# Patient Record
Sex: Female | Born: 1945
Health system: Southern US, Community
[De-identification: ages and names within clinical notes are randomized; demographics above are authoritative.]

## PROBLEM LIST (undated history)

## (undated) DIAGNOSIS — E669 Obesity, unspecified: Secondary | ICD-10-CM

## (undated) DIAGNOSIS — I10 Essential (primary) hypertension: Secondary | ICD-10-CM

## (undated) DIAGNOSIS — K635 Polyp of colon: Secondary | ICD-10-CM

## (undated) DIAGNOSIS — H269 Unspecified cataract: Secondary | ICD-10-CM

## (undated) DIAGNOSIS — L52 Erythema nodosum: Secondary | ICD-10-CM

## (undated) DIAGNOSIS — T7840XA Allergy, unspecified, initial encounter: Secondary | ICD-10-CM

## (undated) DIAGNOSIS — E785 Hyperlipidemia, unspecified: Secondary | ICD-10-CM

## (undated) DIAGNOSIS — E039 Hypothyroidism, unspecified: Secondary | ICD-10-CM

## (undated) DIAGNOSIS — F419 Anxiety disorder, unspecified: Secondary | ICD-10-CM

## (undated) DIAGNOSIS — K219 Gastro-esophageal reflux disease without esophagitis: Secondary | ICD-10-CM

## (undated) HISTORY — PX: COLONOSCOPY: SHX174

## (undated) HISTORY — PX: GUM SURGERY: SHX658

## (undated) HISTORY — DX: Allergy, unspecified, initial encounter: T78.40XA

## (undated) HISTORY — PX: ABDOMINAL HYSTERECTOMY: SHX81

## (undated) HISTORY — PX: HAMMER TOE SURGERY: SHX385

## (undated) HISTORY — PX: BUNIONECTOMY: SHX129

## (undated) HISTORY — DX: Erythema nodosum: L52

## (undated) HISTORY — DX: Essential (primary) hypertension: I10

## (undated) HISTORY — DX: Gastro-esophageal reflux disease without esophagitis: K21.9

## (undated) HISTORY — PX: ADENOIDECTOMY: SUR15

## (undated) HISTORY — DX: Polyp of colon: K63.5

## (undated) HISTORY — DX: Hypothyroidism, unspecified: E03.9

## (undated) HISTORY — DX: Hyperlipidemia, unspecified: E78.5

## (undated) HISTORY — PX: TONSILLECTOMY: SUR1361

## (undated) HISTORY — DX: Unspecified cataract: H26.9

## (undated) HISTORY — DX: Obesity, unspecified: E66.9

## (undated) HISTORY — PX: OOPHORECTOMY: SHX86

## (undated) HISTORY — DX: Anxiety disorder, unspecified: F41.9

---

## 1990-09-13 HISTORY — PX: LAPAROSCOPIC ASSISTED VAGINAL HYSTERECTOMY: SHX5398

## 1998-07-18 ENCOUNTER — Other Ambulatory Visit: Admission: RE | Admit: 1998-07-18 | Discharge: 1998-07-18 | Payer: Self-pay | Admitting: Obstetrics and Gynecology

## 1999-08-11 ENCOUNTER — Other Ambulatory Visit: Admission: RE | Admit: 1999-08-11 | Discharge: 1999-08-11 | Payer: Self-pay | Admitting: Obstetrics and Gynecology

## 2000-04-27 ENCOUNTER — Other Ambulatory Visit: Admission: RE | Admit: 2000-04-27 | Discharge: 2000-04-27 | Payer: Self-pay | Admitting: Plastic Surgery

## 2000-08-11 ENCOUNTER — Other Ambulatory Visit: Admission: RE | Admit: 2000-08-11 | Discharge: 2000-08-11 | Payer: Self-pay | Admitting: Obstetrics and Gynecology

## 2000-09-13 HISTORY — PX: BREAST REDUCTION SURGERY: SHX8

## 2001-03-13 ENCOUNTER — Encounter: Payer: Self-pay | Admitting: Internal Medicine

## 2001-03-13 LAB — CONVERTED CEMR LAB

## 2003-07-03 ENCOUNTER — Other Ambulatory Visit: Admission: RE | Admit: 2003-07-03 | Discharge: 2003-07-03 | Payer: Self-pay | Admitting: Obstetrics and Gynecology

## 2003-07-03 LAB — HM PAP SMEAR: HM Pap smear: NEGATIVE

## 2003-08-21 LAB — HM COLONOSCOPY

## 2004-01-23 ENCOUNTER — Encounter: Admission: RE | Admit: 2004-01-23 | Discharge: 2004-01-23 | Payer: Self-pay | Admitting: Internal Medicine

## 2004-01-23 IMAGING — US US EXTREM LOW VENOUS*L*
1 series · 14 of 24 positions shown · non-contrast
Comparison: none

CLINICAL DATA: Left foot swelling for several weeks.  Left knee pain.

[Series 1: unknown · 14 of 40 slices shown]
[im 1/40]
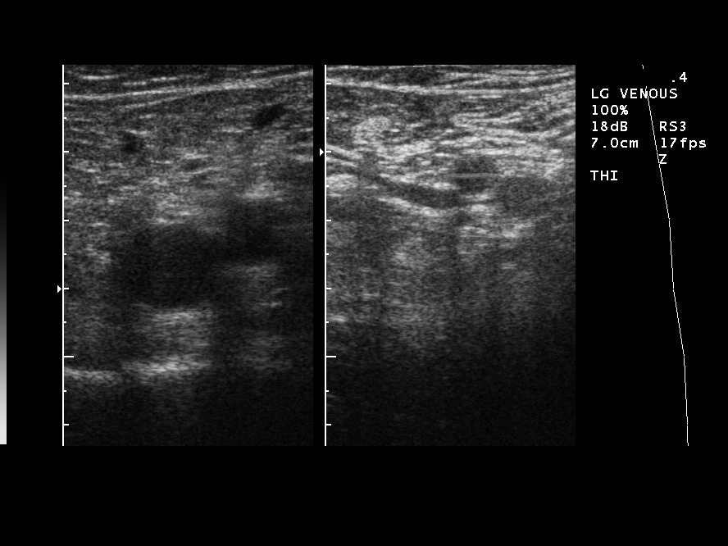
[im 4/40]
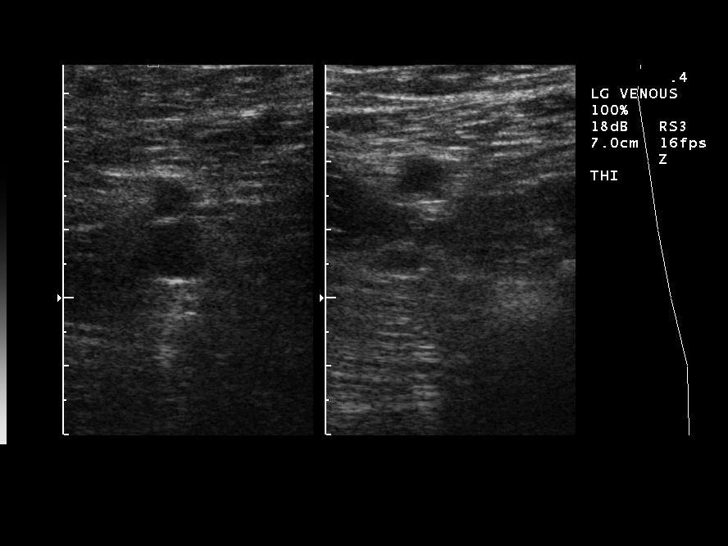
[im 7/40]
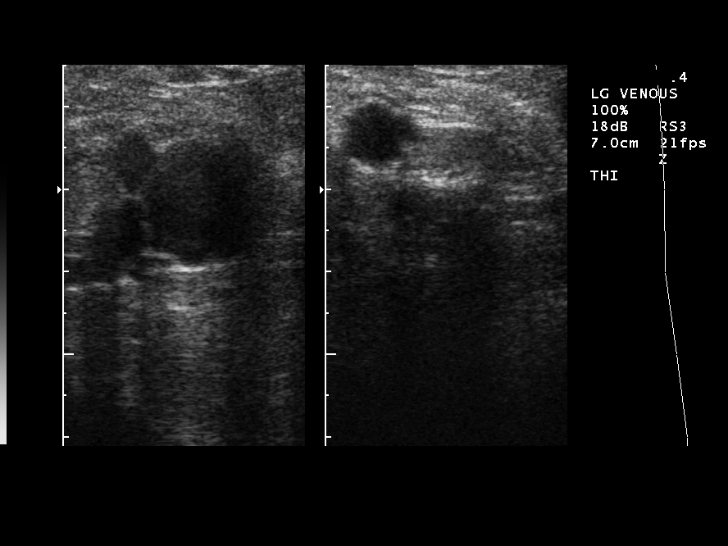
[im 11/40]
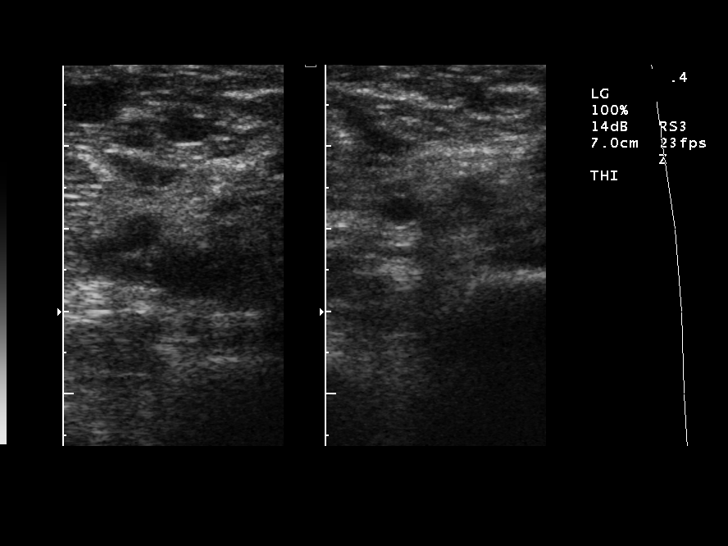
[im 12/40]
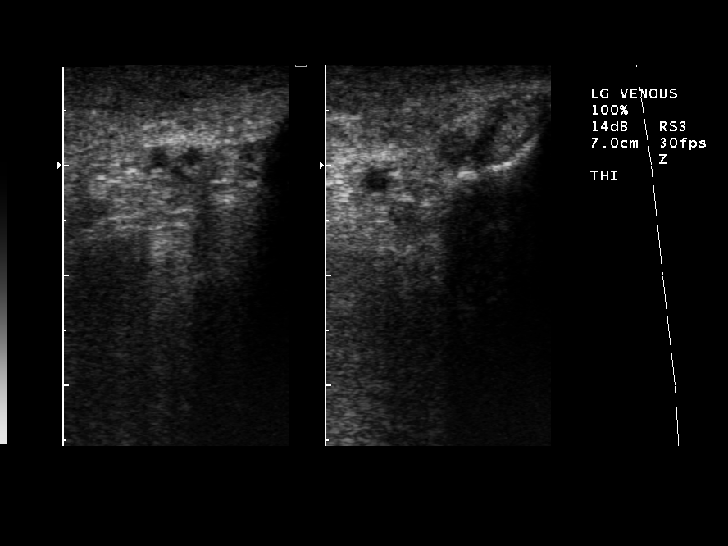
[im 16/40]
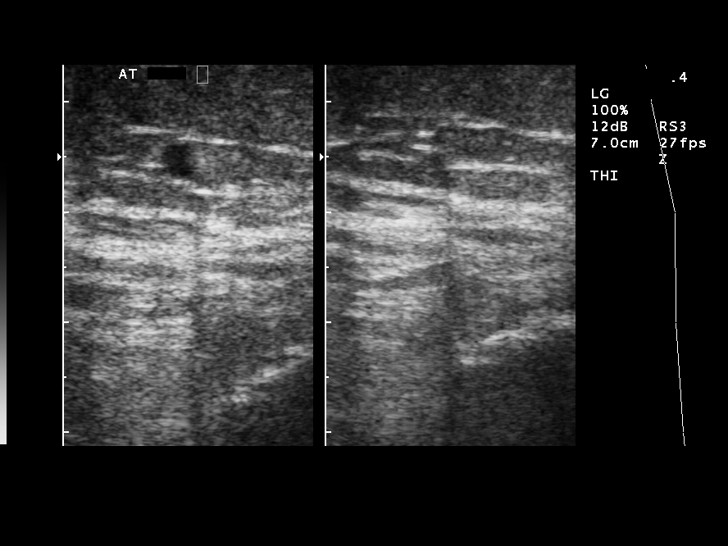
[im 19/40]
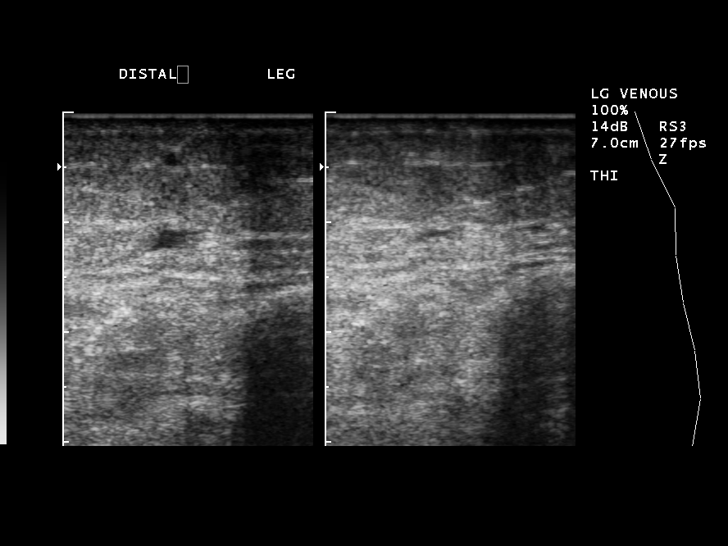
[im 21/40]
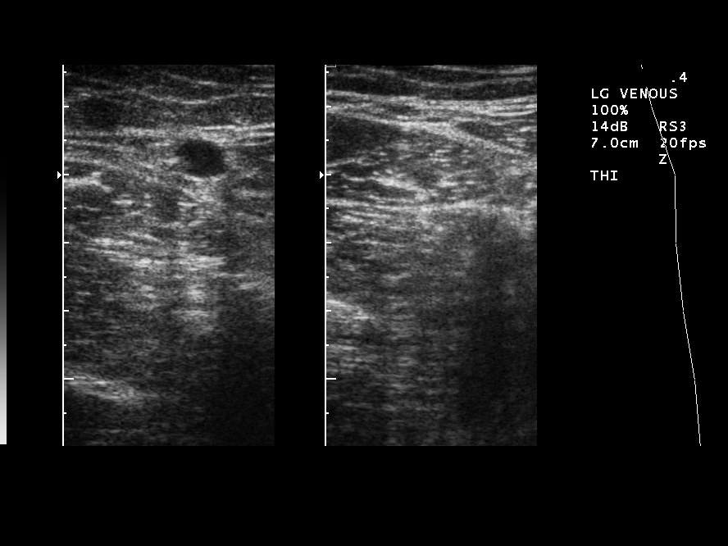
[im 24/40]
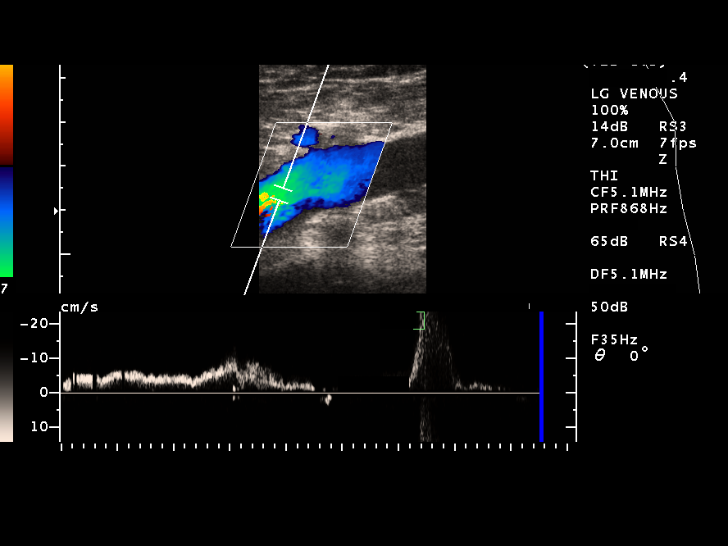
[im 28/40]
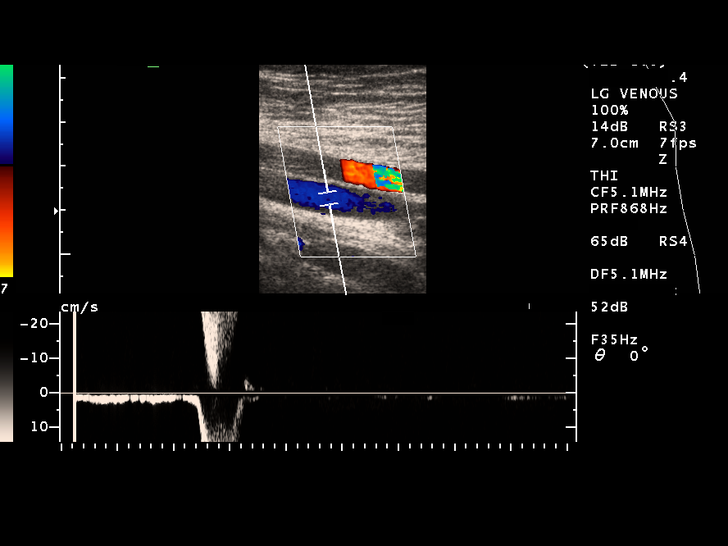
[im 31/40]
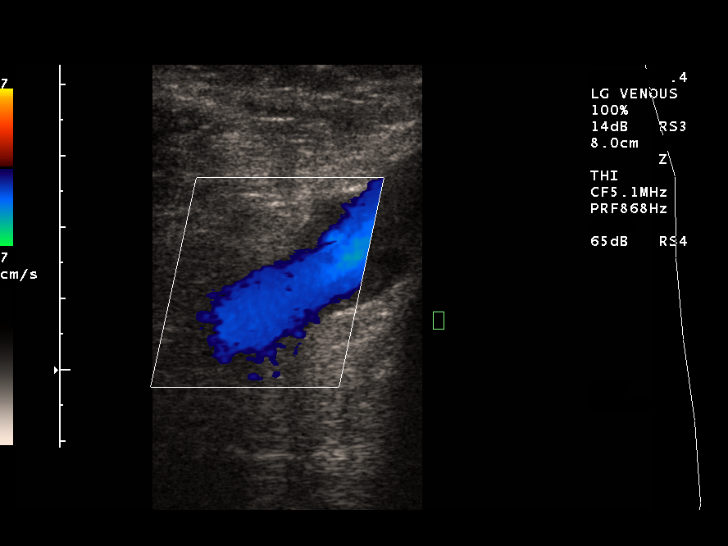
[im 33/40]
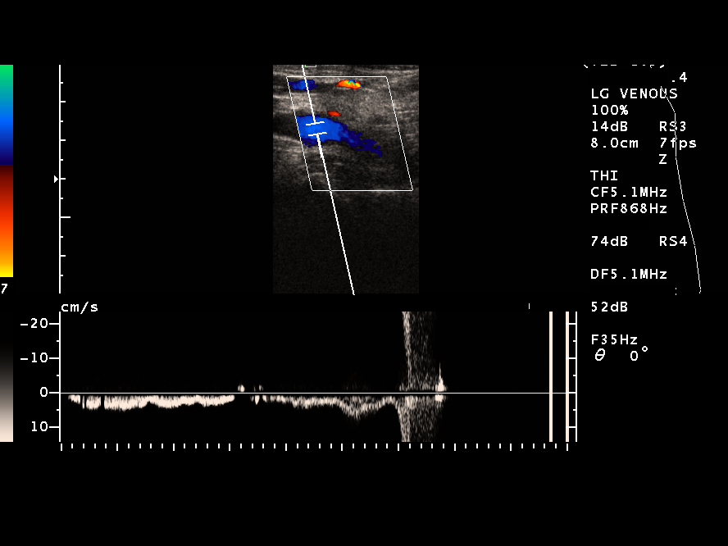
[im 36/40]
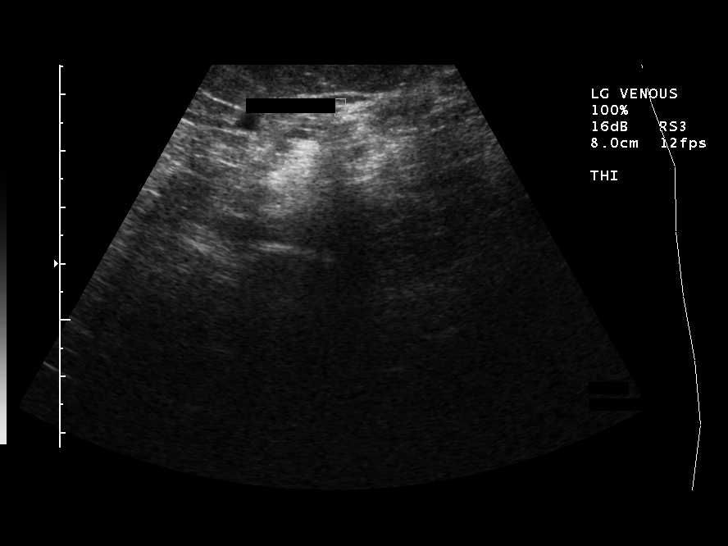
[im 40/40]
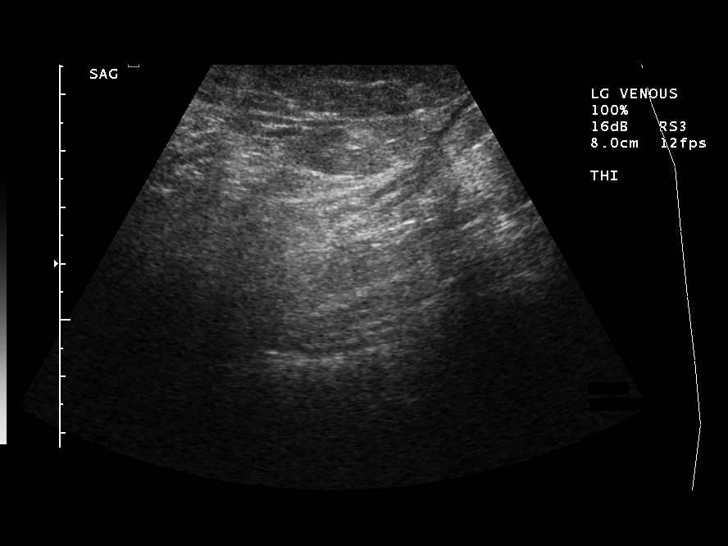

[14 of 24 positions shown; findings below may reference images not displayed]

ULTRASOUND VENOUS IMAGING UNILATERAL LEFT 
 Gray scale and Doppler ultrasound evaluation of the deep venous system was performed from the level of the common femoral vein to the popliteal vein. Complete venous compressibility is demonstrated. Duplex wave forms show normal directional venous flow, with respiratory phasicity and normal response to augmentation.  No sonographic popliteal cyst is seen. 

 IMPRESSION
 No evidence of deep venous thrombosis.

## 2004-07-22 ENCOUNTER — Ambulatory Visit: Payer: Self-pay | Admitting: Internal Medicine

## 2004-07-29 ENCOUNTER — Ambulatory Visit: Payer: Self-pay | Admitting: Internal Medicine

## 2004-09-23 ENCOUNTER — Ambulatory Visit: Payer: Self-pay | Admitting: Internal Medicine

## 2005-07-22 ENCOUNTER — Ambulatory Visit: Payer: Self-pay | Admitting: Internal Medicine

## 2005-08-02 ENCOUNTER — Ambulatory Visit: Payer: Self-pay | Admitting: Internal Medicine

## 2005-09-15 ENCOUNTER — Ambulatory Visit: Payer: Self-pay | Admitting: Internal Medicine

## 2005-09-23 ENCOUNTER — Ambulatory Visit: Payer: Self-pay | Admitting: Internal Medicine

## 2006-05-27 ENCOUNTER — Ambulatory Visit: Payer: Self-pay | Admitting: Internal Medicine

## 2006-06-01 ENCOUNTER — Ambulatory Visit: Payer: Self-pay

## 2006-07-28 ENCOUNTER — Ambulatory Visit: Payer: Self-pay | Admitting: Internal Medicine

## 2006-07-28 LAB — CONVERTED CEMR LAB
ALT: 23 units/L (ref 0–40)
AST: 17 units/L (ref 0–37)
Albumin: 3.5 g/dL (ref 3.5–5.2)
Alkaline Phosphatase: 94 units/L (ref 39–117)
BUN: 19 mg/dL (ref 6–23)
Basophils Absolute: 0 10*3/uL (ref 0.0–0.1)
Basophils Relative: 0.5 % (ref 0.0–1.0)
Bilirubin Urine: NEGATIVE
CO2: 29 meq/L (ref 19–32)
Calcium: 9 mg/dL (ref 8.4–10.5)
Chloride: 108 meq/L (ref 96–112)
Chol/HDL Ratio, serum: 3.5
Cholesterol: 163 mg/dL (ref 0–200)
Creatinine, Ser: 0.9 mg/dL (ref 0.4–1.2)
Eosinophil percent: 2.2 % (ref 0.0–5.0)
Free T4: 0.8 ng/dL — ABNORMAL LOW (ref 0.9–1.8)
GFR calc non Af Amer: 68 mL/min
Glomerular Filtration Rate, Af Am: 83 mL/min/{1.73_m2}
Glucose, Bld: 113 mg/dL — ABNORMAL HIGH (ref 70–99)
HCT: 39.6 % (ref 36.0–46.0)
HDL: 46.8 mg/dL (ref >39.0)
Hemoglobin, Urine: NEGATIVE
Hemoglobin: 13 g/dL (ref 12.0–15.0)
Ketones, ur: NEGATIVE mg/dL
LDL Cholesterol: 99 mg/dL (ref 0–99)
Leukocytes, UA: NEGATIVE
Lymphocytes Relative: 34.6 % (ref 12.0–46.0)
MCHC: 32.9 g/dL (ref 30.0–36.0)
MCV: 83.9 fL (ref 78.0–100.0)
Monocytes Absolute: 0.5 10*3/uL (ref 0.2–0.7)
Monocytes Relative: 7.1 % (ref 3.0–11.0)
Neutro Abs: 3.8 10*3/uL (ref 1.4–7.7)
Neutrophils Relative %: 55.6 % (ref 43.0–77.0)
Nitrite: NEGATIVE
Platelets: 171 10*3/uL (ref 150–400)
Potassium: 4.4 meq/L (ref 3.5–5.1)
RBC: 4.72 M/uL (ref 3.87–5.11)
RDW: 12.6 % (ref 11.5–14.6)
Sodium: 143 meq/L (ref 135–145)
Specific Gravity, Urine: >=1.03 (ref 1.000–1.03)
TSH: 6.03 microintl units/mL — ABNORMAL HIGH (ref 0.35–5.50)
Total Bilirubin: 0.5 mg/dL (ref 0.3–1.2)
Total Protein, Urine: NEGATIVE mg/dL
Total Protein: 6.6 g/dL (ref 6.0–8.3)
Triglyceride fasting, serum: 85 mg/dL (ref 0–149)
Urine Glucose: NEGATIVE mg/dL
Urobilinogen, UA: 0.2 (ref 0.0–1.0)
VLDL: 17 mg/dL (ref 0–40)
WBC: 6.7 10*3/uL (ref 4.5–10.5)
pH: 5.5 (ref 5.0–8.0)

## 2006-08-03 ENCOUNTER — Ambulatory Visit: Payer: Self-pay | Admitting: Internal Medicine

## 2006-09-07 ENCOUNTER — Ambulatory Visit: Payer: Self-pay | Admitting: Internal Medicine

## 2006-09-07 LAB — CONVERTED CEMR LAB
Free T4: 0.9 ng/dL (ref 0.6–1.6)
TSH: 4.97 microintl units/mL (ref 0.35–5.50)

## 2006-10-06 ENCOUNTER — Ambulatory Visit: Payer: Self-pay | Admitting: Internal Medicine

## 2006-12-07 ENCOUNTER — Ambulatory Visit: Payer: Self-pay | Admitting: Internal Medicine

## 2006-12-15 ENCOUNTER — Ambulatory Visit: Payer: Self-pay | Admitting: Internal Medicine

## 2006-12-15 LAB — CONVERTED CEMR LAB: TSH: 6.93 microintl units/mL — ABNORMAL HIGH (ref 0.35–5.50)

## 2007-01-19 ENCOUNTER — Ambulatory Visit: Payer: Self-pay | Admitting: Internal Medicine

## 2007-01-19 LAB — CONVERTED CEMR LAB: TSH: 2.24 microintl units/mL (ref 0.35–5.50)

## 2007-04-28 ENCOUNTER — Encounter: Payer: Self-pay | Admitting: Internal Medicine

## 2007-04-28 DIAGNOSIS — L52 Erythema nodosum: Secondary | ICD-10-CM

## 2007-05-07 DIAGNOSIS — E669 Obesity, unspecified: Secondary | ICD-10-CM

## 2007-05-07 DIAGNOSIS — E039 Hypothyroidism, unspecified: Secondary | ICD-10-CM

## 2007-05-07 DIAGNOSIS — I10 Essential (primary) hypertension: Secondary | ICD-10-CM

## 2007-05-07 DIAGNOSIS — E785 Hyperlipidemia, unspecified: Secondary | ICD-10-CM

## 2007-05-07 DIAGNOSIS — F418 Other specified anxiety disorders: Secondary | ICD-10-CM

## 2007-07-31 ENCOUNTER — Ambulatory Visit: Payer: Self-pay | Admitting: Internal Medicine

## 2007-07-31 LAB — CONVERTED CEMR LAB
Albumin: 4.2 g/dL (ref 3.5–5.2)
Basophils Absolute: 0 10*3/uL (ref 0.0–0.1)
CO2: 29 meq/L (ref 19–32)
Cholesterol: 188 mg/dL (ref 0–200)
Creatinine, Ser: 0.9 mg/dL (ref 0.4–1.2)
HCT: 44.9 % (ref 36.0–46.0)
HDL: 45.9 mg/dL (ref >39.0)
Hemoglobin, Urine: NEGATIVE
Hemoglobin: 15.3 g/dL — ABNORMAL HIGH (ref 12.0–15.0)
Leukocytes, UA: NEGATIVE
MCHC: 34 g/dL (ref 30.0–36.0)
MCV: 84.2 fL (ref 78.0–100.0)
Monocytes Absolute: 0.5 10*3/uL (ref 0.2–0.7)
Neutrophils Relative %: 63.2 % (ref 43.0–77.0)
Potassium: 3.7 meq/L (ref 3.5–5.1)
RDW: 12.2 % (ref 11.5–14.6)
Sodium: 137 meq/L (ref 135–145)
TSH: 9.76 microintl units/mL — ABNORMAL HIGH (ref 0.35–5.50)
Total Bilirubin: 1.1 mg/dL (ref 0.3–1.2)
Total Protein: 7.9 g/dL (ref 6.0–8.3)
Urobilinogen, UA: 0.2 (ref 0.0–1.0)
pH: 5.5 (ref 5.0–8.0)

## 2007-08-06 DIAGNOSIS — Z860100 Personal history of colon polyps, unspecified: Secondary | ICD-10-CM | POA: Insufficient documentation

## 2007-08-06 DIAGNOSIS — K219 Gastro-esophageal reflux disease without esophagitis: Secondary | ICD-10-CM | POA: Insufficient documentation

## 2007-08-07 ENCOUNTER — Ambulatory Visit: Payer: Self-pay | Admitting: Internal Medicine

## 2007-09-11 ENCOUNTER — Ambulatory Visit: Payer: Self-pay | Admitting: Internal Medicine

## 2007-09-11 DIAGNOSIS — E038 Other specified hypothyroidism: Secondary | ICD-10-CM

## 2007-10-20 ENCOUNTER — Telehealth (INDEPENDENT_AMBULATORY_CARE_PROVIDER_SITE_OTHER): Payer: Self-pay | Admitting: *Deleted

## 2007-11-27 ENCOUNTER — Telehealth: Payer: Self-pay | Admitting: Internal Medicine

## 2007-11-30 ENCOUNTER — Ambulatory Visit: Payer: Self-pay | Admitting: Internal Medicine

## 2007-11-30 LAB — CONVERTED CEMR LAB
Albumin: 3.7 g/dL (ref 3.5–5.2)
Alkaline Phosphatase: 94 units/L (ref 39–117)
LDL Cholesterol: 87 mg/dL (ref 0–99)
Total CHOL/HDL Ratio: 3.9

## 2008-01-01 ENCOUNTER — Ambulatory Visit: Payer: Self-pay | Admitting: Internal Medicine

## 2008-01-31 ENCOUNTER — Ambulatory Visit: Payer: Self-pay | Admitting: Internal Medicine

## 2008-07-23 LAB — HM MAMMOGRAPHY: HM Mammogram: NORMAL

## 2008-08-02 ENCOUNTER — Ambulatory Visit: Payer: Self-pay | Admitting: Internal Medicine

## 2008-08-02 LAB — CONVERTED CEMR LAB
Albumin: 3.6 g/dL (ref 3.5–5.2)
BUN: 19 mg/dL (ref 6–23)
Basophils Absolute: 0 10*3/uL (ref 0.0–0.1)
Basophils Relative: 0.5 % (ref 0.0–3.0)
Calcium: 9.3 mg/dL (ref 8.4–10.5)
Chloride: 105 meq/L (ref 96–112)
Cholesterol: 153 mg/dL (ref 0–200)
Creatinine, Ser: 0.8 mg/dL (ref 0.4–1.2)
Eosinophils Absolute: 0.2 10*3/uL (ref 0.0–0.7)
GFR calc Af Amer: 94 mL/min
GFR calc non Af Amer: 78 mL/min
HCT: 41.5 % (ref 36.0–46.0)
Hemoglobin, Urine: NEGATIVE
Ketones, ur: NEGATIVE mg/dL
LDL Cholesterol: 90 mg/dL (ref 0–99)
MCHC: 33.9 g/dL (ref 30.0–36.0)
MCV: 82.6 fL (ref 78.0–100.0)
Monocytes Absolute: 0.6 10*3/uL (ref 0.1–1.0)
Neutro Abs: 4.8 10*3/uL (ref 1.4–7.7)
Neutrophils Relative %: 56.8 % (ref 43.0–77.0)
RBC: 5.03 M/uL (ref 3.87–5.11)
Specific Gravity, Urine: 1.025 (ref 1.000–1.03)
TSH: 0.45 microintl units/mL (ref 0.35–5.50)
Total Bilirubin: 0.8 mg/dL (ref 0.3–1.2)
Total Protein, Urine: NEGATIVE mg/dL
Triglycerides: 109 mg/dL (ref 0–149)
Urine Glucose: NEGATIVE mg/dL
Urobilinogen, UA: 0.2 (ref 0.0–1.0)
VLDL: 22 mg/dL (ref 0–40)

## 2008-08-07 ENCOUNTER — Ambulatory Visit: Payer: Self-pay | Admitting: Internal Medicine

## 2008-08-07 DIAGNOSIS — R9431 Abnormal electrocardiogram [ECG] [EKG]: Secondary | ICD-10-CM | POA: Insufficient documentation

## 2008-08-22 ENCOUNTER — Encounter: Payer: Self-pay | Admitting: Internal Medicine

## 2008-08-22 ENCOUNTER — Ambulatory Visit: Payer: Self-pay

## 2008-10-29 ENCOUNTER — Telehealth (INDEPENDENT_AMBULATORY_CARE_PROVIDER_SITE_OTHER): Payer: Self-pay | Admitting: *Deleted

## 2008-10-30 ENCOUNTER — Ambulatory Visit: Payer: Self-pay | Admitting: Internal Medicine

## 2008-10-30 LAB — CONVERTED CEMR LAB: TSH: 1.16 microintl units/mL (ref 0.35–5.50)

## 2008-12-16 ENCOUNTER — Telehealth (INDEPENDENT_AMBULATORY_CARE_PROVIDER_SITE_OTHER): Payer: Self-pay | Admitting: *Deleted

## 2009-02-26 ENCOUNTER — Ambulatory Visit: Payer: Self-pay | Admitting: Family Medicine

## 2009-03-03 ENCOUNTER — Ambulatory Visit: Payer: Self-pay | Admitting: Family Medicine

## 2009-03-18 ENCOUNTER — Ambulatory Visit: Payer: Self-pay | Admitting: Internal Medicine

## 2009-04-16 ENCOUNTER — Ambulatory Visit: Payer: Self-pay | Admitting: Internal Medicine

## 2009-04-18 LAB — CONVERTED CEMR LAB: TSH: 0.27 microintl units/mL — ABNORMAL LOW (ref 0.35–5.50)

## 2009-08-04 ENCOUNTER — Encounter: Payer: Self-pay | Admitting: Internal Medicine

## 2009-09-25 ENCOUNTER — Ambulatory Visit: Payer: Self-pay | Admitting: Internal Medicine

## 2009-09-25 ENCOUNTER — Telehealth: Payer: Self-pay | Admitting: Internal Medicine

## 2009-09-25 LAB — CONVERTED CEMR LAB
AST: 19 units/L (ref 0–37)
Albumin: 3.9 g/dL (ref 3.5–5.2)
Alkaline Phosphatase: 95 units/L (ref 39–117)
Basophils Absolute: 0 10*3/uL (ref 0.0–0.1)
Basophils Relative: 0.1 % (ref 0.0–3.0)
Bilirubin Urine: NEGATIVE
Bilirubin, Direct: 0 mg/dL (ref 0.0–0.3)
Calcium: 9.1 mg/dL (ref 8.4–10.5)
Creatinine, Ser: 0.8 mg/dL (ref 0.4–1.2)
Eosinophils Absolute: 0.2 10*3/uL (ref 0.0–0.7)
GFR calc non Af Amer: 77.21 mL/min (ref >60)
HDL: 41.4 mg/dL (ref >39.00)
Hemoglobin: 14 g/dL (ref 12.0–15.0)
Ketones, ur: NEGATIVE mg/dL
LDL Cholesterol: 82 mg/dL (ref 0–99)
Leukocytes, UA: NEGATIVE
Lymphocytes Relative: 35.7 % (ref 12.0–46.0)
MCHC: 32.7 g/dL (ref 30.0–36.0)
Monocytes Relative: 7.3 % (ref 3.0–12.0)
Neutro Abs: 3.9 10*3/uL (ref 1.4–7.7)
Neutrophils Relative %: 54.5 % (ref 43.0–77.0)
Nitrite: NEGATIVE
RBC: 4.95 M/uL (ref 3.87–5.11)
Sodium: 135 meq/L (ref 135–145)
Total CHOL/HDL Ratio: 4
Total Protein: 7 g/dL (ref 6.0–8.3)
Triglycerides: 106 mg/dL (ref 0.0–149.0)
Urobilinogen, UA: 0.2 (ref 0.0–1.0)

## 2009-09-26 ENCOUNTER — Encounter: Payer: Self-pay | Admitting: Internal Medicine

## 2009-09-29 ENCOUNTER — Telehealth: Payer: Self-pay | Admitting: Internal Medicine

## 2009-09-29 ENCOUNTER — Ambulatory Visit: Payer: Self-pay | Admitting: Internal Medicine

## 2009-09-29 DIAGNOSIS — N959 Unspecified menopausal and perimenopausal disorder: Secondary | ICD-10-CM | POA: Insufficient documentation

## 2009-10-20 ENCOUNTER — Ambulatory Visit: Payer: Self-pay | Admitting: Internal Medicine

## 2009-10-20 ENCOUNTER — Encounter: Payer: Self-pay | Admitting: Internal Medicine

## 2010-08-24 ENCOUNTER — Encounter: Payer: Self-pay | Admitting: Gastroenterology

## 2010-10-11 LAB — CONVERTED CEMR LAB
ALT: 34 units/L (ref 0–35)
AST: 24 units/L (ref 0–37)
Albumin: 3.4 g/dL — ABNORMAL LOW (ref 3.5–5.2)
Alkaline Phosphatase: 92 units/L (ref 39–117)
Pap Smear: NORMAL
Pap Smear: NORMAL
Total Bilirubin: 0.6 mg/dL (ref 0.3–1.2)

## 2010-10-13 NOTE — Progress Notes (Signed)
Summary: Bone density scan  Phone Note Call from Patient   Caller: Patient [phone] Summary of Call: pt called requesting to know if she could be scheduled to have bone density scan. last scan 2008 Initial call taken by: Margaret Pyle, CMA,  September 29, 2009 3:35 PM  Follow-up for Phone Call        yes - I will do  done hardcopy to LIM side B - dahlia  Follow-up by: Corwin Levins MD,  September 29, 2009 5:32 PM  Additional Follow-up for Phone Call Additional follow up Details #1::        paperwork forwarded to [facility] to schedule Additional Follow-up by: Margaret Pyle, CMA,  September 30, 2009 9:46 AM  New Problems: MENOPAUSAL DISORDER (ICD-627.9)   Additional Follow-up for Phone Call Additional follow up Details #2::    PT IS SCHEDULED FOR FEB 7 AT 9:00.   Follow-up by: Hilarie Fredrickson,  October 01, 2009 11:42 AM  New Problems: MENOPAUSAL DISORDER (ICD-627.9)

## 2010-10-13 NOTE — Progress Notes (Signed)
----   Converted from flag ---- ---- 09/25/2009 2:04 PM, Corwin Levins MD wrote: ok for verbal  ---- 09/25/2009 8:05 AM, [name] wrote: the lab called this morning. Mary Ward is in the Lab with a Vitamin D lab order from her OBGYN. They need you to approve it since she is having it done here. Let me know as they only need a verbal. ------------------------------  Phone Note Other Incoming    Follow-up for Phone Call        called lab to give Dr. Jonny Ruiz verbal ok, lab stated they needed a faxed addon sheet, with Vit D 858.10 Follow-up by: Scharlene Gloss,  September 25, 2009 4:44 PM

## 2010-10-13 NOTE — Assessment & Plan Note (Signed)
Summary: PER PT Mary Ward PHYSICAL  STC   Vital Signs:  Patient profile:   65 year old female Height:      65.5 inches Weight:      194 pounds BMI:     31.91 O2 Sat:      98 % on Room air Temp:     97.3 degrees F oral Pulse rate:   64 / minute BP sitting:   120 / 82  (left arm) Cuff size:   regular  Vitals Entered ByZella Ball Ward (September 29, 2009 8:37 AM)  O2 Flow:  Room air   CC: adult physical/RE   CC:  adult physical/RE.  History of Present Illness: lost almost 25 lbs sicne june 2010 with more excercise and better diet; Pt denies CP, sob, doe, wheezing, orthopnea, pnd, worsening LE edema, palps, dizziness or syncope  Pt denies new neuro symptoms such as headache, facial or extremity weakness     Problems Prior to Update: 1)  Menopausal Disorder  (ICD-627.9) 2)  Electrocardiogram, Abnormal  (ICD-794.31) 3)  Preventive Health Care  (ICD-V70.0) 4)  Need Proph Vaccination&inoculat Oth Viral Dz  (ICD-V04.89) 5)  Other Specified Acquired Hypothyroidism  (ICD-244.8) 6)  Preventive Health Care  (ICD-V70.0) 7)  Family History Diabetes 1st Degree Relative  (ICD-V18.0) 8)  Colonic Polyps, Hx of  (ICD-V12.72) 9)  Family History of Cad Female 1st Degree Relative <60  (ICD-V16.49) 10)  Gerd  (ICD-530.81) 11)  Routine General Medical Exam@health  Care Facl  (ICD-V70.0) 12)  Hypothyroidism  (ICD-244.9) 13)  Hypertension  (ICD-401.9) 14)  Anxiety  (ICD-300.00) 15)  Obesity  (ICD-278.00) 16)  Hyperlipidemia  (ICD-272.4) 17)  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 1 By Mouth Once Daily 2)  Lipitor 20 Mg  Tabs (Atorvastatin Calcium) .Marland Kitchen.. 1 By Mouth Once Daily 3)  Micardis Hct 80-25 Mg Tabs (Telmisartan-Hctz) .... Take 1/2 By Mouth Qd 4)  Ecotrin Low Strength 81 Mg  Tbec (Aspirin) .Marland Kitchen.. 1 By Mouth Qd 5)  Vitamin D [address] Unit  Caps (Ergocalciferol) .... Take 1 Tablet By Mouth Once Every Two  Weeks  Current Medications (verified): 1)  Levothyroxine Sodium 137 Mcg Tabs (Levothyroxine Sodium) .Marland Kitchen.. 1 By Mouth Once Daily 2)  Lipitor 20 Mg  Tabs (Atorvastatin Calcium) .Marland Kitchen.. 1 By Mouth Once Daily 3)  Micardis Hct 40-12.5 Mg Tabs (Telmisartan-Hctz) .Marland Kitchen.. 1 By Mouth Once Daily 4)  Ecotrin Low Strength 81 Mg  Tbec (Aspirin) .Marland Kitchen.. 1 By Mouth Qd 5)  Vitamin D 60454 Unit  Caps (Ergocalciferol) .... Take 1 Tablet By Mouth Once Every Two Weeks  Allergies (verified): 1)  ! Prednisone 2)  ! Tetracycline 3)  ! Claritin 4)  ! Indocin 5)  ! * Tetanus  Past History:  Past Medical History: Last updated: 08/07/2007 Hyperlipidemia Obesity Anxiety Hypertension Hypothyroidism GERD erythema nodosum hx Colonic polyps, hx of - hyperplastic  Past Surgical History: Last updated: 08/07/2007 Hysterectomy Oophorectomy Tonsillectomy Breast reduction surgury 2002  s/p right 2nd toe hammer toe surgury  Family History: Last updated: 08/07/2008 Family History High cholesterol Family History of CAD Female 1st degree relative  - sister Family History Lung cancer - father Family History of Stroke F 1st degree relative  - mother - also elev chol Family History Diabetes 1st degree relative - sister sister died with MI at 33 yo after multiple silent MI  Social History: Last updated: 09/29/2009 Never Smoked Alcohol use-yes - rare Married no  children work - Mary Ward - Mary Ward  Risk Factors: Smoking Status: never (08/06/2007)  Social History: Reviewed history from 01/31/2008 and no changes required. Never Smoked Alcohol use-yes - rare Married no children work - Mary Ward - Mary Ward  Review of Systems  The patient denies anorexia, fever, weight loss, weight gain, vision loss, decreased hearing, hoarseness, chest pain, syncope, dyspnea on exertion, peripheral edema, prolonged cough, headaches, hemoptysis, abdominal pain, melena, hematochezia, severe indigestion/heartburn, hematuria,  incontinence, muscle weakness, suspicious skin lesions, transient blindness, difficulty walking, depression, unusual weight change, abnormal bleeding, enlarged lymph nodes, and angioedema.         all otherwise negative per pt -  Physical Exam  General:  alert and overweight-appearing.   Head:  normocephalic and atraumatic.   Eyes:  vision grossly intact, pupils equal, and pupils round.   Ears:  R ear normal and L ear normal.   Nose:  no external deformity and no nasal discharge.   Mouth:  no gingival abnormalities and pharynx pink and moist.   Neck:  supple and no masses.   Lungs:  normal respiratory effort and normal breath sounds.   Heart:  normal rate and regular rhythm.   Abdomen:  soft, non-tender, and normal bowel sounds.   Msk:  no joint tenderness and no joint swelling.   Extremities:  no edema, no erythema  Neurologic:  cranial nerves II-XII intact and strength normal in all extremities.     Impression & Recommendations:  Problem # 1:  Preventive Health Care (ICD-V70.0)  Overall doing well, updated the age appropriate counseling and education;  routine health screening/prevention reviewed and done as appropriate unless declined, immunizations up to date or declined, diet counseling done if overweight, urged to quit smoking if smokes , most recent labs reviewed and current ordered if appropriate, ecg reviewed or declined   Orders: EKG w/ Interpretation (93000)  Problem # 2:  HYPERTENSION (ICD-401.9)  Her updated medication list for this problem includes:    Micardis Hct 40-12.5 Mg Tabs (Telmisartan-hctz) .Marland Kitchen... 1 by mouth once daily  BP today: 120/82 Prior BP: 112/60 (03/18/2009)  Labs Reviewed: K+: 3.9 (09/25/2009) Creat: : 0.8 (09/25/2009)   Chol: 145 (09/25/2009)   HDL: 41.40 (09/25/2009)   LDL: 82 (09/25/2009)   TG: 106.0 (09/25/2009) stable overall by hx and exam, ok to continue meds/tx as is   Complete Medication List: 1)  Levothyroxine Sodium 137 Mcg Tabs  (Levothyroxine sodium) .Marland Kitchen.. 1 by mouth once daily 2)  Lipitor 20 Mg Tabs (Atorvastatin calcium) .Marland Kitchen.. 1 by mouth once daily 3)  Micardis Hct 40-12.5 Mg Tabs (Telmisartan-hctz) .Marland Kitchen.. 1 by mouth once daily 4)  Ecotrin Low Strength 81 Mg Tbec (Aspirin) .Marland Kitchen.. 1 by mouth qd 5)  Vitamin D 21308 Unit Caps (Ergocalciferol) .... Take 1 tablet by mouth once every two weeks  Other Orders: Prescription Created Electronically [id number])  Patient Instructions: 1)  Continue all previous medications as before this visit  2)  Keep up the Wt loss and diet! 3)  Please schedule a follow-up appointment in 1 year or sooner if needed Prescriptions: MICARDIS HCT 40-12.5 MG TABS (TELMISARTAN-HCTZ) 1 by mouth once daily  #90 x 3   Entered and Authorized by:   Corwin Levins MD   Signed by:   Corwin Levins MD on 09/29/2009   Method used:   Print then Give to Patient   RxID:   [id number] LIPITOR 20 MG  TABS (ATORVASTATIN CALCIUM) 1 by mouth once daily  #  90 x 3   Entered and Authorized by:   Corwin Levins MD   Signed by:   Corwin Levins MD on 09/29/2009   Method used:   Print then Give to Patient   RxID:   [id number] LEVOTHYROXINE SODIUM 137 MCG TABS (LEVOTHYROXINE SODIUM) 1 by mouth once daily  #90 x 3   Entered and Authorized by:   Corwin Levins MD   Signed by:   Corwin Levins MD on 09/29/2009   Method used:   Print then Give to Patient   RxID:   [id number] LEVOTHYROXINE SODIUM 137 MCG TABS (LEVOTHYROXINE SODIUM) 1 by mouth once daily  #30 x 11   Entered and Authorized by:   Corwin Levins MD   Signed by:   Corwin Levins MD on 09/29/2009   Method used:   Electronically to        [facility] [phone]* (retail)       [address]       [place], [place]  [address]       Ph: [phone] or [phone]       Fax: [phone]   RxID:   [id number] LIPITOR 20 MG  TABS (ATORVASTATIN CALCIUM) 1 by mouth once daily  #30 x 11   Entered and Authorized by:   Corwin Levins MD   Signed by:   Corwin Levins MD on  09/29/2009   Method used:   Electronically to        [facility] [phone]* (retail)       [address]       [place], [place]  [address]       Ph: [phone] or [phone]       Fax: [phone]   RxID:   [id number] MICARDIS HCT 40-12.5 MG TABS (TELMISARTAN-HCTZ) 1 by mouth once daily  #30 x 11   Entered and Authorized by:   Corwin Levins MD   Signed by:   Corwin Levins MD on 09/29/2009   Method used:   Electronically to        [facility] [phone]* (retail)       [address]       [place], [place]  [address]       Ph: [phone] or [phone]       Fax: [phone]   RxID:   [id number]    Immunization History:  Influenza Immunization History:    Influenza:  historical (06/13/2009)   Prevention & Chronic Care Immunizations   Influenza vaccine: Historical  (06/13/2009)    Tetanus booster: 08/07/2008: declined    Pneumococcal vaccine: Pneumovax  (08/07/2007)   Pneumococcal vaccine due: 08/2012    H. zoster vaccine: 01/01/2008: Zostavax  Colorectal Screening   Hemoccult: Not documented    Colonoscopy: Hyperplastic Polyp  (08/21/2003)   Colonoscopy due: 08/2013  Other Screening   Pap smear: normal  (08/14/2007)    Mammogram: normal  (07/23/2008)    DXA bone density scan: declined  (08/07/2008)   DXA scan due: 08/2007    Smoking status: never  (08/06/2007)  Lipids   Total Cholesterol: 145  (09/25/2009)   LDL: 82  (09/25/2009)   LDL Direct: Not documented   HDL: 41.40  (09/25/2009)   Triglycerides: 106.0  (09/25/2009)    SGOT (AST): 19  (09/25/2009)   SGPT (ALT): 24  (09/25/2009)   Alkaline phosphatase: 95  (09/25/2009)   Total bilirubin: 0.9  (09/25/2009)  Hypertension  Last Blood Pressure: 120 / 82  (09/29/2009)   Serum creatinine: 0.8  (09/25/2009)   Serum potassium 3.9  (09/25/2009)  Self-Management Support :    Hypertension self-management support: Not documented    Lipid self-management support: Not documented

## 2010-10-13 NOTE — Miscellaneous (Signed)
Summary: BONE DENSITY  Clinical Lists Changes  Orders: Added new Test order of T-Lumbar Vertebral Assessment (77082) - Signed 

## 2010-10-15 NOTE — Letter (Signed)
Summary: Colonoscopy Date Change Letter  [facility]  [address]   [place], [place] [address]   Phone: [phone]  Fax: [phone]      August 24, 2010 MRN: [id number]   [facility] [address] [place], [place]  [address]   Dear Ms. Vanbenschoten,   Previously you were recommended to have a repeat colonoscopy around this time. Your chart was recently reviewed by Dr. Russella Dar of [facility]. Follow up colonoscopy is now recommended in December 2014. This revised recommendation is based on current, nationally recognized guidelines for colorectal cancer screening and polyp surveillance. These guidelines are endorsed by the American Cancer Society, [employer] on Colorectal Cancer as well as numerous other major medical organizations.  Please understand that our recommendation assumes that you do not have any new symptoms such as bleeding, a change in bowel habits, anemia, or significant abdominal discomfort. If you do have any concerning GI symptoms or want to discuss the guideline recommendations, please call to arrange an office visit at your earliest convenience. Otherwise we will keep you in our reminder system and contact you 1-2 months prior to the date listed above to schedule your next colonoscopy.  Thank you,  Claudette Head, M.D.  [facility] [phone]

## 2010-10-20 ENCOUNTER — Telehealth: Payer: Self-pay | Admitting: Internal Medicine

## 2010-10-29 NOTE — Progress Notes (Signed)
  Phone Note Refill Request Message from:  Fax from Pharmacy on October 20, 2010 11:37 AM  Refills Requested: Medication #1:  LEVOTHYROXINE SODIUM 137 MCG TABS 1 by mouth once daily   Dosage confirmed as above?Dosage Confirmed   Last Refilled: 09/2009   Notes: [facility]  Medication #2:  LIPITOR 20 MG  TABS 1 by mouth once daily   Dosage confirmed as above?Dosage Confirmed   Last Refilled: 09/2009   Notes: [facility]  Medication #3:  MICARDIS HCT 40-12.5 MG TABS 1 by mouth once daily   Dosage confirmed as above?Dosage Confirmed   Last Refilled: 09/2009   Notes: [facility] Initial call taken by: Robin Ewing CMA (AAMA),  October 20, 2010 11:38 AM    Prescriptions: LIPITOR 20 MG  TABS (ATORVASTATIN CALCIUM) 1 by mouth once daily  #90 x 0   Entered by:   Scharlene Gloss CMA (AAMA)   Authorized by:   Corwin Levins MD   Signed by:   Scharlene Gloss CMA (AAMA) on 10/20/2010   Method used:   Faxed to ...       [facility] (mail-order)             , [place]         Ph: [phone]       Fax: [phone]   RxID:   [id number] MICARDIS HCT 40-12.5 MG TABS (TELMISARTAN-HCTZ) 1 by mouth once daily  #90 x 0   Entered by:   [name] CMA (AAMA)   Authorized by:   Corwin Levins MD   Signed by:   Scharlene Gloss CMA (AAMA) on 10/20/2010   Method used:   Faxed to ...       [facility] (mail-order)             , [place]         Ph: [phone]       Fax: [phone]   RxID:   [id number] LEVOTHYROXINE SODIUM 137 MCG TABS (LEVOTHYROXINE SODIUM) 1 by mouth once daily  #90 x 0   Entered by:   [name] CMA (AAMA)   Authorized by:   Corwin Levins MD   Signed by:   Scharlene Gloss CMA (AAMA) on 10/20/2010   Method used:   Faxed to ...       [facility] (mail-order)             , [place]         Ph: [phone]       Fax: [phone]   RxID:   [id number]

## 2010-11-04 ENCOUNTER — Other Ambulatory Visit: Payer: BC Managed Care – PPO

## 2010-11-04 ENCOUNTER — Encounter (INDEPENDENT_AMBULATORY_CARE_PROVIDER_SITE_OTHER): Payer: Self-pay | Admitting: *Deleted

## 2010-11-04 ENCOUNTER — Other Ambulatory Visit: Payer: Self-pay | Admitting: Internal Medicine

## 2010-11-04 DIAGNOSIS — Z0000 Encounter for general adult medical examination without abnormal findings: Secondary | ICD-10-CM

## 2010-11-04 LAB — URINALYSIS
Bilirubin Urine: NEGATIVE
Ketones, ur: NEGATIVE
Nitrite: NEGATIVE
Specific Gravity, Urine: 1.025 (ref 1.000–1.030)
Total Protein, Urine: NEGATIVE
pH: 5.5 (ref 5.0–8.0)

## 2010-11-04 LAB — CBC WITH DIFFERENTIAL/PLATELET
Basophils Absolute: 0 10*3/uL (ref 0.0–0.1)
Basophils Relative: 0.5 % (ref 0.0–3.0)
Eosinophils Absolute: 0.2 10*3/uL (ref 0.0–0.7)
HCT: 42.4 % (ref 36.0–46.0)
Hemoglobin: 14 g/dL (ref 12.0–15.0)
Lymphs Abs: 2.7 10*3/uL (ref 0.7–4.0)
MCHC: 33.1 g/dL (ref 30.0–36.0)
MCV: 85 fl (ref 78.0–100.0)
Neutro Abs: 4.5 10*3/uL (ref 1.4–7.7)
RBC: 4.99 Mil/uL (ref 3.87–5.11)
RDW: 13.1 % (ref 11.5–14.6)

## 2010-11-04 LAB — BASIC METABOLIC PANEL
BUN: 16 mg/dL (ref 6–23)
CO2: 27 mEq/L (ref 19–32)
Calcium: 9.4 mg/dL (ref 8.4–10.5)
Creatinine, Ser: 0.8 mg/dL (ref 0.4–1.2)

## 2010-11-04 LAB — HEPATIC FUNCTION PANEL
ALT: 27 U/L (ref 0–35)
AST: 21 U/L (ref 0–37)
Bilirubin, Direct: 0.1 mg/dL (ref 0.0–0.3)
Total Bilirubin: 0.6 mg/dL (ref 0.3–1.2)

## 2010-11-04 LAB — LIPID PANEL: Total CHOL/HDL Ratio: 4

## 2010-11-12 ENCOUNTER — Encounter (INDEPENDENT_AMBULATORY_CARE_PROVIDER_SITE_OTHER): Payer: BC Managed Care – PPO | Admitting: Internal Medicine

## 2010-11-12 ENCOUNTER — Ambulatory Visit (INDEPENDENT_AMBULATORY_CARE_PROVIDER_SITE_OTHER)
Admission: RE | Admit: 2010-11-12 | Discharge: 2010-11-12 | Disposition: A | Discharge status: Home / Self Care | Payer: BC Managed Care – PPO | Source: Ambulatory Visit | Attending: Internal Medicine | Admitting: Internal Medicine

## 2010-11-12 ENCOUNTER — Encounter: Payer: Self-pay | Admitting: Internal Medicine

## 2010-11-12 ENCOUNTER — Other Ambulatory Visit: Payer: Self-pay | Admitting: Internal Medicine

## 2010-11-12 DIAGNOSIS — Z0000 Encounter for general adult medical examination without abnormal findings: Secondary | ICD-10-CM

## 2010-11-12 DIAGNOSIS — R0609 Other forms of dyspnea: Secondary | ICD-10-CM

## 2010-11-12 DIAGNOSIS — R0989 Other specified symptoms and signs involving the circulatory and respiratory systems: Secondary | ICD-10-CM

## 2010-11-12 IMAGING — CR DG CHEST 2V
2 series · 2 of 2 positions shown · non-contrast
Comparison: None.

CLINICAL DATA: Shortness of breath with exertion.

CHEST - 2 VIEW

[view not recorded (1 of 2)]
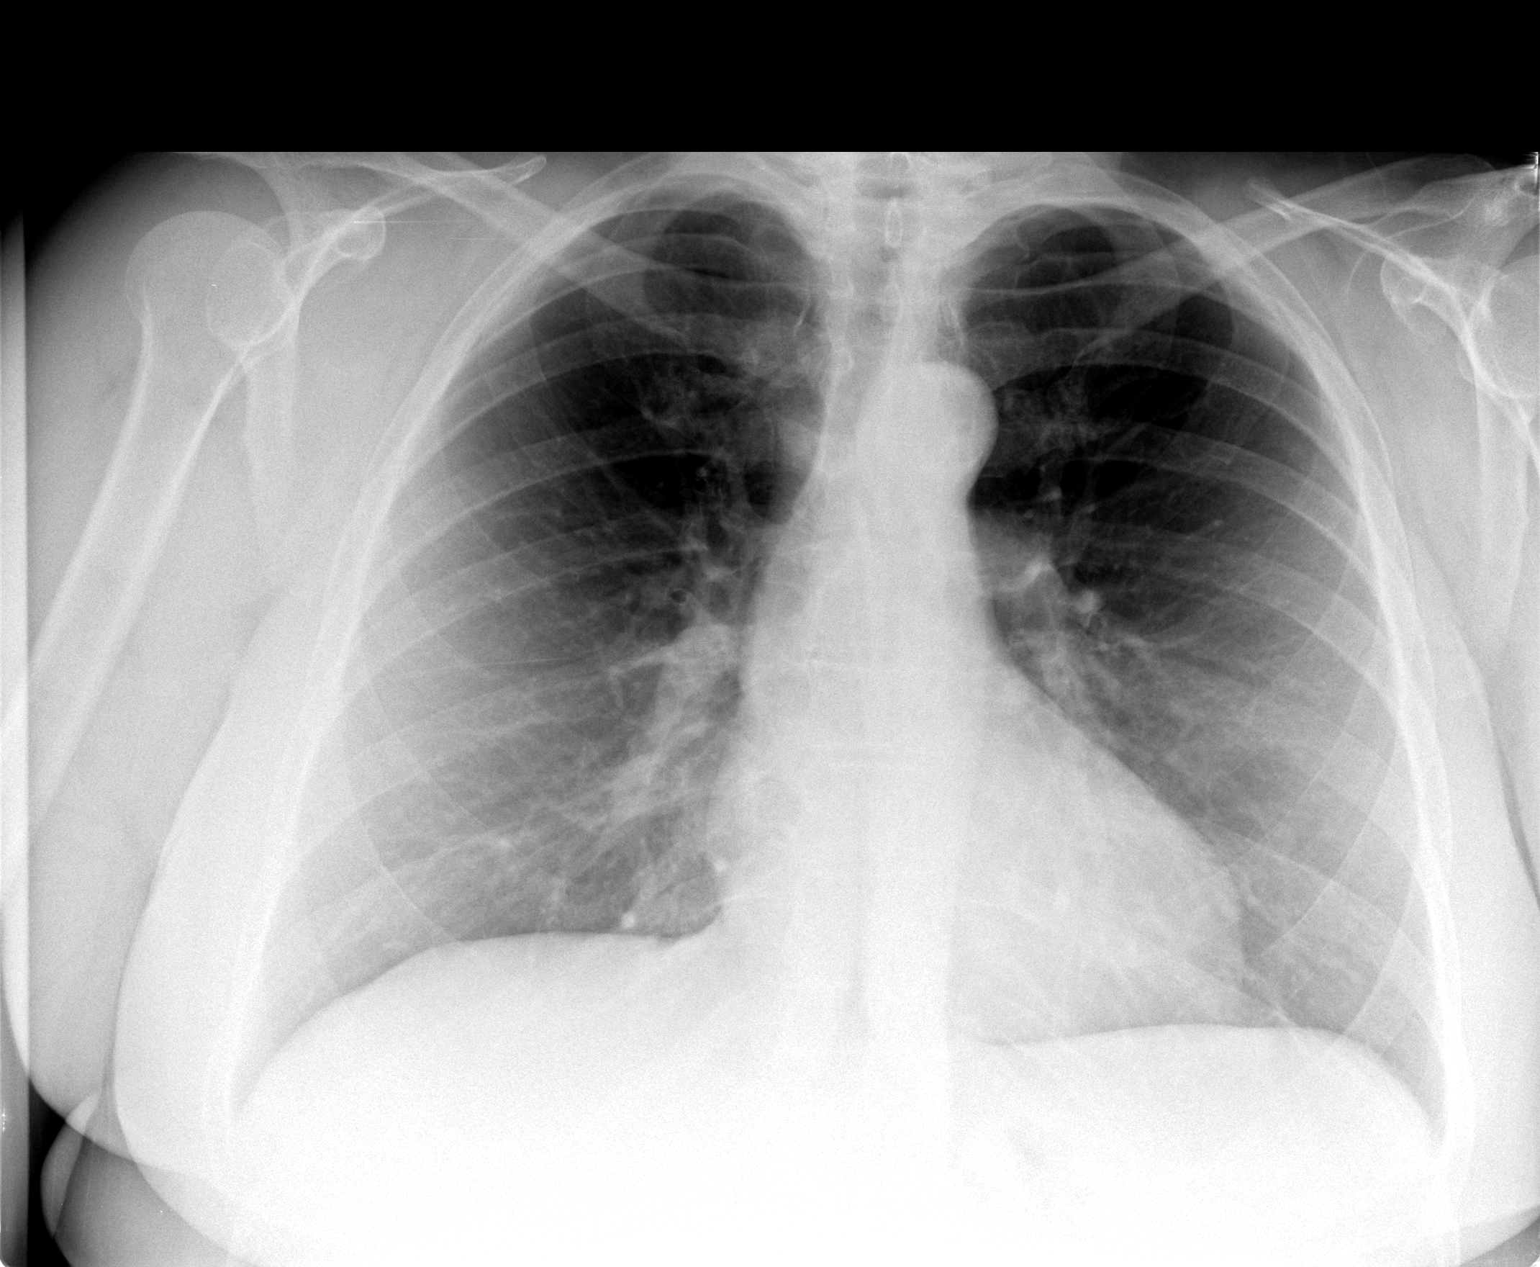

[view not recorded (2 of 2)]
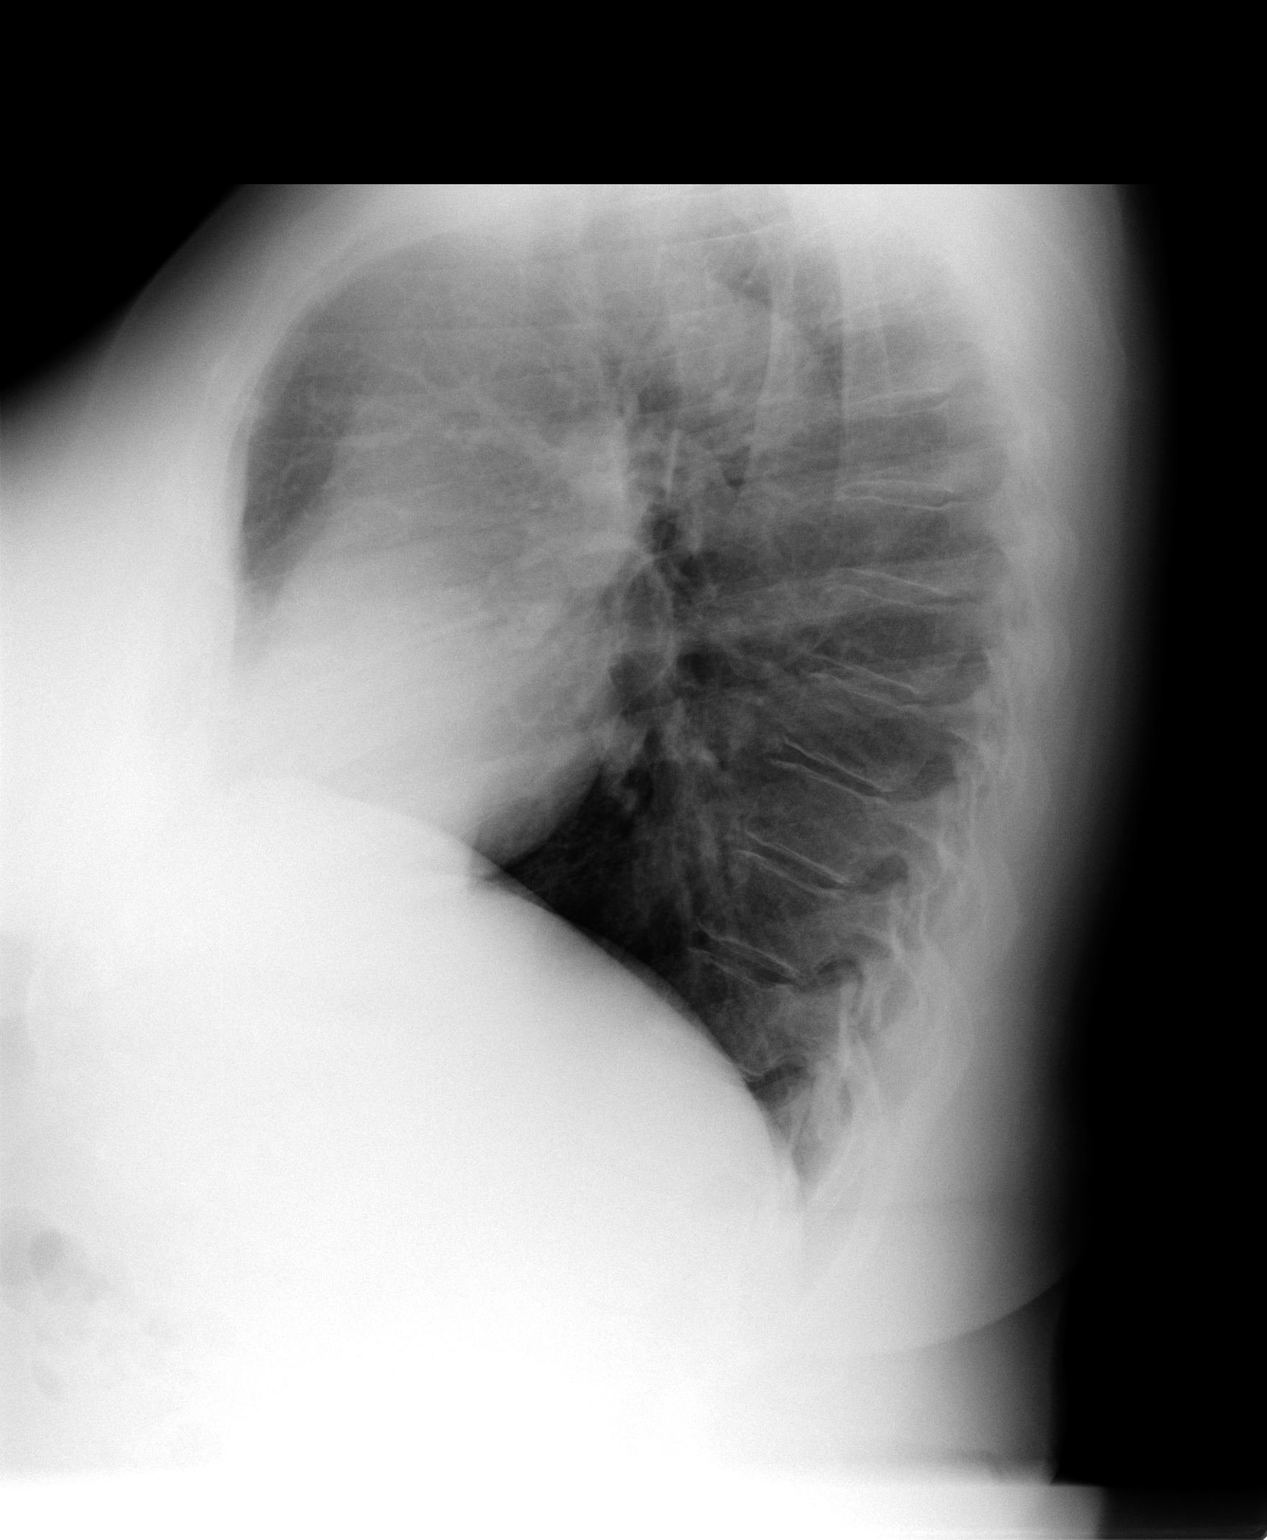

[2 of 2 positions shown; findings below may reference images not displayed]

FINDINGS: Trachea is midline.  Heart size normal.  Lungs are clear.
No pleural fluid.  Mild pectus deformity.
IMPRESSION: No acute findings.

## 2010-11-16 ENCOUNTER — Encounter: Payer: Self-pay | Admitting: Internal Medicine

## 2010-11-16 ENCOUNTER — Encounter (INDEPENDENT_AMBULATORY_CARE_PROVIDER_SITE_OTHER): Payer: BC Managed Care – PPO

## 2010-11-16 DIAGNOSIS — R0989 Other specified symptoms and signs involving the circulatory and respiratory systems: Secondary | ICD-10-CM

## 2010-11-16 DIAGNOSIS — R0609 Other forms of dyspnea: Secondary | ICD-10-CM

## 2010-11-18 ENCOUNTER — Encounter: Payer: Self-pay | Admitting: Internal Medicine

## 2010-11-19 NOTE — Assessment & Plan Note (Signed)
Summary: PHYSICAL--STC   Vital Signs:  Patient profile:   65 year old female Height:      66 inches Weight:      215.50 pounds BMI:     34.91 O2 Sat:      95 % on Room air Temp:     98.8 degrees F oral Pulse rate:   80 / minute BP sitting:   132 / 82  (left arm) Cuff size:   large  Vitals Entered By: [name] CMA (AAMA) (November 12, 2010 1:11 PM)  O2 Flow:  Room air  Preventive Care Screening  Bone Density:    Date:  10/14/2009    Next Due:  10/2011    Results:  abnormal std dev  Mammogram:    Date:  07/14/2010    Results:  normal   CC: Adult Physical/RE   CC:  Adult Physical/RE.  History of Present Illness: her for wellness and f/u;  overall doing ok  but quite a bit more stress recently after husband lost his job and she is only income for the family;  Pt denies CP, worsening sob, doe, wheezing, orthopnea, pnd, worsening LE edema, palps, dizziness or syncope  Pt denies new neuro symptoms such as headache, facial or extremity weakness  Pt denies polydipsia, polyuria Overall good compliance with meds, trying to follow low chol diet, wt stable, little excercise however  .  No fever, wt loss, night sweats, loss of appetite or other constitutional symptoms  Overall good compliance with meds, and good tolerability.  Denies worsening depressive symptoms, suicidal ideation, or panic.  Pt states good ability with ADL's, low fall risk, home safety reviewed and adequate, no significant change in hearing or vision, trying to follow lower chol diet, and occasionally active only with regular excercise.   Preventive Screening-Counseling & Management      Drug Use:  no.    Problems Prior to Update: 1)  Dyspnea On Exertion  (ICD-786.09) 2)  Menopausal Disorder  (ICD-627.9) 3)  Electrocardiogram, Abnormal  (ICD-794.31) 4)  Preventive Health Care  (ICD-V70.0) 5)  Need Proph Vaccination&inoculat Oth Viral Dz  (ICD-V04.89) 6)  Other Specified Acquired Hypothyroidism  (ICD-244.8) 7)   Preventive Health Care  (ICD-V70.0) 8)  Family History Diabetes 1st Degree Relative  (ICD-V18.0) 9)  Colonic Polyps, Hx of  (ICD-V12.72) 10)  Family History of Cad Female 1st Degree Relative <60  (ICD-V16.49) 11)  Gerd  (ICD-530.81) 12)  Routine General Medical Exam@health  Care [facility]  (ICD-V70.0) 13)  Hypothyroidism  (ICD-244.9) 14)  Hypertension  (ICD-401.9) 15)  Anxiety  (ICD-300.00) 16)  Obesity  (ICD-278.00) 17)  Hyperlipidemia  (ICD-272.4) 18)  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 1 By Mouth Once Daily 2)  Lipitor 20 Mg  Tabs (Atorvastatin Calcium) .Marland Kitchen.. 1 By Mouth Once Daily 3)  Micardis Hct 40-12.5 Mg Tabs (Telmisartan-Hctz) .Marland Kitchen.. 1 By Mouth Once Daily 4)  Ecotrin Low Strength 81 Mg  Tbec (Aspirin) .Marland Kitchen.. 1 By Mouth Qd 5)  Vitamin D 56213 Unit  Caps (Ergocalciferol) .... Take 1 Tablet By Mouth Once Every Two Weeks  Current Medications (verified): 1)  Levothyroxine Sodium 137 Mcg Tabs (Levothyroxine Sodium) .Marland Kitchen.. 1 By Mouth Once Daily 2)  Lipitor 20 Mg  Tabs (Atorvastatin Calcium) .Marland Kitchen.. 1 By Mouth Once Daily- Generic Ok 3)  Micardis Hct 40-12.5 Mg Tabs (Telmisartan-Hctz) .Marland Kitchen.. 1 By Mouth Once Daily 4)  Ecotrin Low Strength 81 Mg  Tbec (Aspirin) .Marland Kitchen.. 1 By Mouth Qd 5)  Vitamin D 16109 Unit  Caps (Ergocalciferol) .... Take 1 Tablet By Mouth Once Every Two Weeks 6)  Calcium 600 Mg Tabs (Calcium) .Marland Kitchen.. 1 By Mouth Two Times A Day  Allergies (verified): 1)  ! Prednisone 2)  ! Tetracycline 3)  ! Claritin 4)  ! Indocin 5)  ! * Tetanus  Past History:  Past Medical History: Last updated: 08/07/2007 Hyperlipidemia Obesity Anxiety Hypertension Hypothyroidism GERD erythema nodosum hx Colonic polyps, hx of - hyperplastic  Past Surgical History: Last updated: 08/07/2007 Hysterectomy Oophorectomy Tonsillectomy Breast reduction surgury 2002  s/p right 2nd toe hammer toe surgury  Family  History: Last updated: 08/07/2008 Family History High cholesterol Family History of CAD Female 1st degree relative  - sister Family History Lung cancer - father Family History of Stroke F 1st degree relative  - mother - also elev chol Family History Diabetes 1st degree relative - sister sister died with MI at 1 yo after multiple silent MI  Social History: Last updated: 11/12/2010 Never Smoked Alcohol use-yes - rare Married no children 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 - [employer] Drug use-no  Risk Factors: Smoking Status: never (08/06/2007)  Social History: Never Smoked Alcohol use-yes - rare Married no children 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 - [employer] Drug use-no Drug Use:  no  Review of Systems  The patient denies anorexia, fever, vision loss, decreased hearing, hoarseness, chest pain, syncope, dyspnea on exertion, peripheral edema, prolonged cough, headaches, hemoptysis, abdominal pain, melena, hematochezia, severe indigestion/heartburn, hematuria, muscle weakness, suspicious skin lesions, transient blindness, difficulty walking, depression, unusual weight change, abnormal bleeding, enlarged lymph nodes, and angioedema.         all otherwise negative per pt -    Physical Exam  General:  alert and overweight-appearing.   Head:  normocephalic and atraumatic.   Eyes:  vision grossly intact, pupils equal, and pupils round.   Ears:  R ear normal and L ear normal.   Nose:  no external deformity and no nasal discharge.   Mouth:  no gingival abnormalities and pharynx pink and moist.   Neck:  supple and no masses.   Lungs:  normal respiratory effort and normal breath sounds.   Heart:  normal rate and regular rhythm.   Abdomen:  soft, non-tender, and normal bowel sounds.   Msk:  no joint tenderness and no joint swelling.   Extremities:  no edema, no erythema  Neurologic:  cranial nerves II-XII intact and strength normal in all extremities.   Skin:  color normal and no rashes.   Psych:  not depressed  appearing and moderately anxious.     Impression & Recommendations:  Problem # 1:  Preventive Health Care (ICD-V70.0) Overall doing well, age appropriate education and counseling updated, referral for preventive services and immunizations addressed, dietary counseling and smoking status adressed , most recent labs reviewed, ecg reviewed I have personally reviewed and have noted 1.The patient's medical and social history 2.Their use of alcohol, tobacco or illicit drugs 3.Their current medications and supplements 4. Functional ability including ADL's, fall risk, home safety risk, hearing & visual impairment  5.Diet and physical activities 6.Evidence for depression or mood disorders The patients weight, height, BMI  have been recorded in the chart I have made referrals, counseling and provided education to the patient based review of the above  Orders: EKG w/ Interpretation (93000)  Problem # 2:  DYSPNEA ON EXERTION (ICD-786.09)  Her updated medication list for this problem includes:  Micardis Hct 40-12.5 Mg Tabs (Telmisartan-hctz) .Marland Kitchen... 1 by mouth once daily  Orders: T-2 View CXR, Same Day (71020.5TC) Echo Referral (Echo) Misc. Referral (Misc. Ref)  Recommended fluid and salt restriction.  unclear etiology- for cxr, echo, and PFT's for further evaluation;  if neg most liekly due to decondtioning  Problem # 3:  HYPERTENSION (ICD-401.9)  Her updated medication list for this problem includes:    Micardis Hct 40-12.5 Mg Tabs (Telmisartan-hctz) .Marland Kitchen... 1 by mouth once daily  BP today: 132/82 Prior BP: 120/82 (09/29/2009)  Labs Reviewed: K+: 4.3 (11/04/2010) Creat: : 0.8 (11/04/2010)   Chol: 150 (11/04/2010)   HDL: 41.30 (11/04/2010)   LDL: 88 (11/04/2010)   TG: 106.0 (11/04/2010) stable overall by hx and exam, ok to continue meds/tx as is   Complete Medication List: 1)  Levothyroxine Sodium 137 Mcg Tabs (Levothyroxine sodium) .Marland Kitchen.. 1 by mouth once daily 2)  Lipitor 20 Mg Tabs  (Atorvastatin calcium) .Marland Kitchen.. 1 by mouth once daily- generic ok 3)  Micardis Hct 40-12.5 Mg Tabs (Telmisartan-hctz) .Marland Kitchen.. 1 by mouth once daily 4)  Ecotrin Low Strength 81 Mg Tbec (Aspirin) .Marland Kitchen.. 1 by mouth qd 5)  Vitamin D 46962 Unit Caps (Ergocalciferol) .... Take 1 tablet by mouth once every two weeks 6)  Calcium 600 Mg Tabs (Calcium) .Marland Kitchen.. 1 by mouth two times a day  Patient Instructions: 1)  You will be contacted about the referral(s) to: echocardiogram, PFT's 2)  Please go to Radiology in the basement level for your X-Ray today  3)  Please call the number on the [facility] Card for results of your testing 4)  Continue all previous medications as before this visit  5)  Please schedule a follow-up appointment in 1 year, or sooner if needed Prescriptions: LEVOTHYROXINE SODIUM 137 MCG TABS (LEVOTHYROXINE SODIUM) 1 by mouth once daily  #90 x 3   Entered and Authorized by:   Corwin Levins MD   Signed by:   Corwin Levins MD on 11/12/2010   Method used:   Print then Give to Patient   RxID:   [id number] LIPITOR 20 MG  TABS (ATORVASTATIN CALCIUM) 1 by mouth once daily- generic ok  #90 x 3   Entered and Authorized by:   Corwin Levins MD   Signed by:   Corwin Levins MD on 11/12/2010   Method used:   Print then Give to Patient   RxID:   [id number] MICARDIS HCT 40-12.5 MG TABS (TELMISARTAN-HCTZ) 1 by mouth once daily  #30 x 11   Entered and Authorized by:   Corwin Levins MD   Signed by:   Corwin Levins MD on 11/12/2010   Method used:   Print then Give to Patient   RxID:   [id number] MICARDIS HCT 40-12.5 MG TABS (TELMISARTAN-HCTZ) 1 by mouth once daily  #90 x 3   Entered and Authorized by:   Corwin Levins MD   Signed by:   Corwin Levins MD on 11/12/2010   Method used:   Print then Give to Patient   RxID:   [id number]    Orders Added: 1)  EKG w/ Interpretation [93000] 2)  T-2 View CXR, Same Day [71020.5TC] 3)  Echo Referral [Echo] 4)  Misc. Referral [Misc. Ref] 5)  Est. Patient  40-64 years [phone]

## 2010-11-25 ENCOUNTER — Ambulatory Visit (HOSPITAL_COMMUNITY): Payer: BC Managed Care – PPO | Attending: Cardiology

## 2010-11-25 DIAGNOSIS — R0609 Other forms of dyspnea: Secondary | ICD-10-CM | POA: Insufficient documentation

## 2010-11-25 DIAGNOSIS — I10 Essential (primary) hypertension: Secondary | ICD-10-CM | POA: Insufficient documentation

## 2010-11-25 DIAGNOSIS — E669 Obesity, unspecified: Secondary | ICD-10-CM | POA: Insufficient documentation

## 2010-11-25 DIAGNOSIS — Z8249 Family history of ischemic heart disease and other diseases of the circulatory system: Secondary | ICD-10-CM | POA: Insufficient documentation

## 2010-11-25 DIAGNOSIS — R0989 Other specified symptoms and signs involving the circulatory and respiratory systems: Secondary | ICD-10-CM

## 2010-12-01 NOTE — Assessment & Plan Note (Signed)
Summary: pft charges   Allergies: 1)  ! Prednisone 2)  ! Tetracycline 3)  ! Claritin 4)  ! Indocin 5)  ! * Tetanus   Other Orders: Carbon Monoxide diffusing w/capacity (16109) Lung Volumes/Gas dilution or washout (60454) Spirometry (Pre & Post) [phone])

## 2011-11-10 ENCOUNTER — Other Ambulatory Visit (INDEPENDENT_AMBULATORY_CARE_PROVIDER_SITE_OTHER): Payer: BC Managed Care – PPO

## 2011-11-10 ENCOUNTER — Telehealth: Payer: Self-pay

## 2011-11-10 DIAGNOSIS — Z0000 Encounter for general adult medical examination without abnormal findings: Secondary | ICD-10-CM

## 2011-11-10 LAB — CBC WITH DIFFERENTIAL/PLATELET
Basophils Relative: 0.5 % (ref 0.0–3.0)
Eosinophils Absolute: 0.2 10*3/uL (ref 0.0–0.7)
Eosinophils Relative: 2.8 % (ref 0.0–5.0)
HCT: 43.3 % (ref 36.0–46.0)
Lymphs Abs: 2.8 10*3/uL (ref 0.7–4.0)
MCHC: 33 g/dL (ref 30.0–36.0)
MCV: 85.2 fl (ref 78.0–100.0)
Monocytes Absolute: 0.5 10*3/uL (ref 0.1–1.0)
Platelets: 165 10*3/uL (ref 150.0–400.0)
WBC: 8.5 10*3/uL (ref 4.5–10.5)

## 2011-11-10 LAB — URINALYSIS, ROUTINE W REFLEX MICROSCOPIC
Bilirubin Urine: NEGATIVE
Nitrite: NEGATIVE
Urobilinogen, UA: 0.2 (ref 0.0–1.0)

## 2011-11-10 LAB — HEPATIC FUNCTION PANEL
Albumin: 4 g/dL (ref 3.5–5.2)
Total Bilirubin: 0.7 mg/dL (ref 0.3–1.2)

## 2011-11-10 LAB — BASIC METABOLIC PANEL
BUN: 19 mg/dL (ref 6–23)
Calcium: 9.1 mg/dL (ref 8.4–10.5)
GFR: 85.23 mL/min (ref >60.00)
Glucose, Bld: 95 mg/dL (ref 70–99)

## 2011-11-10 LAB — TSH: TSH: 0.44 u[IU]/mL (ref 0.35–5.50)

## 2011-11-10 LAB — LIPID PANEL
Cholesterol: 156 mg/dL (ref 0–200)
HDL: 51.3 mg/dL (ref >39.00)
LDL Cholesterol: 90 mg/dL (ref 0–99)
VLDL: 15.2 mg/dL (ref 0.0–40.0)

## 2011-11-10 NOTE — Telephone Encounter (Signed)
Put order in for physical labs. 

## 2011-11-12 ENCOUNTER — Encounter: Payer: Self-pay | Admitting: Internal Medicine

## 2011-11-12 DIAGNOSIS — Z0000 Encounter for general adult medical examination without abnormal findings: Secondary | ICD-10-CM | POA: Insufficient documentation

## 2011-11-12 DIAGNOSIS — Z0001 Encounter for general adult medical examination with abnormal findings: Secondary | ICD-10-CM | POA: Insufficient documentation

## 2011-11-15 ENCOUNTER — Encounter: Payer: Self-pay | Admitting: Internal Medicine

## 2011-11-15 ENCOUNTER — Ambulatory Visit (INDEPENDENT_AMBULATORY_CARE_PROVIDER_SITE_OTHER): Payer: BC Managed Care – PPO | Admitting: Internal Medicine

## 2011-11-15 VITALS — BP 124/82 | HR 68 | Temp 97.5°F | Ht 60.5 in | Wt 198.1 lb

## 2011-11-15 DIAGNOSIS — Z0000 Encounter for general adult medical examination without abnormal findings: Secondary | ICD-10-CM

## 2011-11-15 MED ORDER — TELMISARTAN-HCTZ 40-12.5 MG PO TABS: 1.0000 | ORAL_TABLET | Freq: Every day | ORAL | Status: DC

## 2011-11-15 MED ORDER — LEVOTHYROXINE SODIUM 137 MCG PO TABS: 137.0000 ug | ORAL_TABLET | Freq: Every day | ORAL | Status: DC

## 2011-11-15 MED ORDER — ATORVASTATIN CALCIUM 20 MG PO TABS: 20.0000 mg | ORAL_TABLET | Freq: Every day | ORAL | Status: DC

## 2011-11-15 NOTE — Patient Instructions (Addendum)
Continue all other medications as before No change in treatment at this time. You are up to date with prevention as well Your prescriptions were forwarded to [facility] Please return in 1 year for your yearly visit, or sooner if needed, with Lab testing done 3-5 days before

## 2011-11-15 NOTE — Progress Notes (Signed)
Subjective:    Patient ID: Mary Ward, female    DOB: 19-Jun-1946, 66 y.o.   MRN: [id number]  HPI  Here for wellness and f/u;  Overall doing ok;  Pt denies CP, worsening SOB, DOE, wheezing, orthopnea, PND, worsening LE edema, palpitations, dizziness or syncope.  Pt denies neurological change such as new Headache, facial or extremity weakness.  Pt denies polydipsia, polyuria, or low sugar symptoms. Pt states overall good compliance with treatment and medications, good tolerability, and trying to follow lower cholesterol diet.  Pt denies worsening depressive symptoms, suicidal ideation or panic. No fever, wt loss, night sweats, loss of appetite, or other constitutional symptoms.  Pt states good ability with ADL's, low fall risk, home safety reviewed and adequate, no significant changes in hearing or vision, and occasionally active with exercise.  Thinks she had more difficutly loseing wt on generic thyroid med so wants the brand name.  Has had some with some problems with focus.  Had some incresaed stress with husbnad not working, but he is now back to working. Past Medical History  Diagnosis Date  . Hyperlipidemia   . Obesity   . Anxiety   . Hypertension   . Hypothyroidism   . GERD (gastroesophageal reflux disease)   . EN (erythema nodosum)   . Hyperplastic colonic polyp    Past Surgical History  Procedure Date  . Abdominal hysterectomy   . Oophorectomy   . Tonsillectomy   . Breast reduction surgery 2002  . Hammer toe surgery     right 2nd toe    reports that she has never smoked. She does not have any smokeless tobacco history on file. She reports that she drinks alcohol. Her drug history not on file. family history includes Cancer in her father; Diabetes in her sister; Heart disease in her sister; Hyperlipidemia in her mother; and Stroke in her mother. Allergies  Allergen Reactions  . Indomethacin   . Loratadine   . Prednisone   . Tetanus Toxoid   . Tetracycline    Current  Outpatient Prescriptions on File Prior to Visit  Medication Sig Dispense Refill  . aspirin 81 MG tablet Take 81 mg by mouth daily.        Marland Kitchen atorvastatin (LIPITOR) 20 MG tablet Take 20 mg by mouth daily.        . ergocalciferol (VITAMIN D2) 50000 UNIT capsule Take 50,000 Units by mouth once a week. Take 1 tablet by mouth once every 2 weeks       . levothyroxine (SYNTHROID, LEVOTHROID) 137 MCG tablet Take 137 mcg by mouth daily.        Marland Kitchen telmisartan-hydrochlorothiazide (MICARDIS HCT) 40-12.5 MG per tablet Take 1 tablet by mouth daily.         Review of Systems Review of Systems  Constitutional: Negative for diaphoresis, activity change, appetite change and unexpected weight change.  HENT: Negative for hearing loss, ear pain, facial swelling, mouth sores and neck stiffness.   Eyes: Negative for pain, redness and visual disturbance.  Respiratory: Negative for shortness of breath and wheezing.   Cardiovascular: Negative for chest pain and palpitations.  Gastrointestinal: Negative for diarrhea, blood in stool, abdominal distention and rectal pain.  Genitourinary: Negative for hematuria, flank pain and decreased urine volume.  Musculoskeletal: Negative for myalgias and joint swelling.  Skin: Negative for color change and wound.  Neurological: Negative for syncope and numbness.  Hematological: Negative for adenopathy.  Psychiatric/Behavioral: Negative for hallucinations, self-injury, decreased concentration and agitation.  Objective:   Physical Exam BP 124/82  Pulse 68  Temp(Src) 97.5 F (36.4 C) (Oral)  Ht 5' 0.5" (1.537 m)  Wt 198 lb 2 oz (89.869 kg)  BMI 38.06 kg/m2  SpO2 97% Physical Exam  VS noted Constitutional: Pt is oriented to person, place, and time. Appears well-developed and well-nourished.  HENT:  Head: Normocephalic and atraumatic.  Right Ear: External ear normal.  Left Ear: External ear normal.  Nose: Nose normal.  Mouth/Throat: Oropharynx is clear and moist.    Eyes: Conjunctivae and EOM are normal. Pupils are equal, round, and reactive to light.  Neck: Normal range of motion. Neck supple. No JVD present. No tracheal deviation present.  Cardiovascular: Normal rate, regular rhythm, normal heart sounds and intact distal pulses.   Pulmonary/Chest: Effort normal and breath sounds normal.  Abdominal: Soft. Bowel sounds are normal. There is no tenderness.  Musculoskeletal: Normal range of motion. Exhibits no edema.  Lymphadenopathy:  Has no cervical adenopathy.  Neurological: Pt is alert and oriented to person, place, and time. Pt has normal reflexes. No cranial nerve deficit.  Skin: Skin is warm and dry. No rash noted.  Psychiatric:  Has  normal mood and affect. Behavior is normal.     Assessment & Plan:

## 2011-11-15 NOTE — Assessment & Plan Note (Signed)

## 2011-12-28 ENCOUNTER — Encounter: Payer: Self-pay | Admitting: Internal Medicine

## 2011-12-28 ENCOUNTER — Ambulatory Visit (INDEPENDENT_AMBULATORY_CARE_PROVIDER_SITE_OTHER): Payer: BC Managed Care – PPO | Admitting: Internal Medicine

## 2011-12-28 VITALS — BP 120/78 | HR 75 | Temp 97.3°F | Ht 65.0 in | Wt 200.0 lb

## 2011-12-28 DIAGNOSIS — L989 Disorder of the skin and subcutaneous tissue, unspecified: Secondary | ICD-10-CM

## 2011-12-28 NOTE — Patient Instructions (Signed)
Continue all other medications as before No new medications needed at this time

## 2012-01-02 ENCOUNTER — Encounter: Payer: Self-pay | Admitting: Internal Medicine

## 2012-01-02 DIAGNOSIS — L989 Disorder of the skin and subcutaneous tissue, unspecified: Secondary | ICD-10-CM | POA: Insufficient documentation

## 2012-01-02 NOTE — Progress Notes (Signed)
  Subjective:    Patient ID: Mary Ward, female    DOB: [birth year], 66 y.o.   MRN: [id number]  HPI  Here to inquire about a small subq lesion to the distal left foot, noticed for several wks, had recent tendonitis tx per podiatry with steroid injections, but the lesion wont go away, though has gotten smaller, does not hurt, and not assoc with trauma, red, swelling, pain or other such as neurovasc symptoms. Past Medical History  Diagnosis Date  . Hyperlipidemia   . Obesity   . Anxiety   . Hypertension   . Hypothyroidism   . GERD (gastroesophageal reflux disease)   . EN (erythema nodosum)   . Hyperplastic colonic polyp    Past Surgical History  Procedure Date  . Abdominal hysterectomy   . Oophorectomy   . Tonsillectomy   . Breast reduction surgery 2002  . Hammer toe surgery     right 2nd toe    reports that she has never smoked. She does not have any smokeless tobacco history on file. She reports that she drinks alcohol. Her drug history not on file. family history includes Cancer in her father; Diabetes in her sister; Heart disease in her sister; Hyperlipidemia in her mother; and Stroke in her mother. Allergies  Allergen Reactions  . Indomethacin   . Loratadine   . Prednisone   . Tetanus Toxoid   . Tetracycline    Current Outpatient Prescriptions on File Prior to Visit  Medication Sig Dispense Refill  . aspirin 81 MG tablet Take 81 mg by mouth daily.        Marland Kitchen atorvastatin (LIPITOR) 20 MG tablet Take 1 tablet (20 mg total) by mouth daily.  90 tablet  3  . ergocalciferol (VITAMIN D2) 50000 UNIT capsule Take 50,000 Units by mouth once a week. Take 1 tablet by mouth once every 2 weeks       . levothyroxine (SYNTHROID, LEVOTHROID) 137 MCG tablet Take 1 tablet (137 mcg total) by mouth daily.  90 tablet  3  . telmisartan-hydrochlorothiazide (MICARDIS HCT) 40-12.5 MG per tablet Take 1 tablet by mouth daily.  90 tablet  3   Review of Systems All otherwise neg per pt       Objective:   Physical Exam BP 120/78  Pulse 75  Temp(Src) 97.3 F (36.3 C) (Oral)  Ht 5\' 5"  (1.651 m)  Wt 200 lb (90.719 kg)  BMI 33.28 kg/m2  SpO2 96% Physical Exam  VS noted Neck: Normal range of motion. Neck supple.  Cardiovascular: Normal rate and regular rhythm.   Pulmonary/Chest: Effort normal and breath sounds normal.  Neurological: Pt is alert. LE motor/dtr/gait intact Skin: Skin is warm. No erythema. Left dorsal foot with approx 8 mm subq cystic type lesion nontender near but not assoc with 4th dorsal tendon near the MTP Psychiatric: Pt behavior is normal. Thought content normal. 1+ nervous    Assessment & Plan:

## 2012-01-02 NOTE — Assessment & Plan Note (Signed)
Small cystic lesion most liekly, nontender and foot o/w neurovasc intact, not clear if related to recent tendonitis, but benign appearing, d/w pt- ok to follow, no specific tx needed at this time

## 2012-06-05 ENCOUNTER — Telehealth: Payer: Self-pay

## 2012-06-05 DIAGNOSIS — E039 Hypothyroidism, unspecified: Secondary | ICD-10-CM

## 2012-06-05 DIAGNOSIS — F22 Delusional disorders: Secondary | ICD-10-CM

## 2012-06-05 NOTE — Telephone Encounter (Signed)
Pt called requesting a referral to Dr Talmage Nap for thyroid hypothyroidism.

## 2012-06-05 NOTE — Telephone Encounter (Signed)
Done per emr 

## 2012-08-03 ENCOUNTER — Ambulatory Visit: Payer: BC Managed Care – PPO | Admitting: Dietician

## 2012-08-08 ENCOUNTER — Ambulatory Visit: Payer: BC Managed Care – PPO | Admitting: Family Medicine

## 2012-08-17 ENCOUNTER — Encounter: Payer: Self-pay | Admitting: Family Medicine

## 2012-08-17 ENCOUNTER — Ambulatory Visit (INDEPENDENT_AMBULATORY_CARE_PROVIDER_SITE_OTHER): Payer: BC Managed Care – PPO | Admitting: Family Medicine

## 2012-08-17 VITALS — Ht 65.5 in | Wt 201.0 lb

## 2012-08-17 DIAGNOSIS — E669 Obesity, unspecified: Secondary | ICD-10-CM

## 2012-08-17 DIAGNOSIS — R7309 Other abnormal glucose: Secondary | ICD-10-CM

## 2012-08-17 DIAGNOSIS — R739 Hyperglycemia, unspecified: Secondary | ICD-10-CM | POA: Insufficient documentation

## 2012-08-17 NOTE — Patient Instructions (Addendum)
-   Find out what your Vitamin D level is as well as history of that level.   - For your next appt, please try to bring your husband.   - Do what you can to get more/better sleep.   - For your best blood sugar control and for wt management, you want a diet that is low-glycemic.    - Diet Recommendations for Diabetes   Starchy (carb) foods include: Bread, rice, pasta, potatoes, corn, crackers, bagels, muffins, all baked goods.   Protein foods include: Meat, fish, poultry, eggs, dairy foods, and beans such as pinto and kidney beans (beans also provide carbohydrate).   1. Eat at least 3 meals and 1-2 snacks per day. Never go more than 4-5 hours while awake without eating.  2. Limit starchy foods to TWO per meal and ONE per snack. ONE portion of a starchy  food is equal to the following:   - ONE slice of bread (or its equivalent, such as half of a hamburger bun).   - 1/2 cup of a "scoopable" starchy food such as potatoes or rice.   - 1 OUNCE (28 grams) of starchy snack foods such as crackers or pretzels (look on label).   - 15 grams of carbohydrate as shown on food label.  3. Both lunch and dinner should include a protein food, a carb food, and vegetables.   - Obtain twice as many veg's as protein or carbohydrate foods for both lunch and dinner.   - Try to keep frozen veg's on hand for a quick vegetable serving.     - Fresh or frozen veg's are best.  4. Breakfast should always include protein.  - Fruit, milk, and yogurt all have carbohydrate too, but for purposes of controlling blood glucose (BG) levels, we are focusing on the main starch (& sugar) foods.  - Note:  If a food has at least 5 grams of fiber per serving, you can subtract those grams from the total carb amount.     - Video on exercise:  Http://vimeo.RUE/45409811 - For the next two weeks, EACH time you eat something sweet, write it down.  Then write:  Why you made decision, were you happy with that decision, and given the SAME  circumstances, would you make that same choice?   - Please call next week to schedule a January appt:  [phone]. - AT FOLLOW-UP WE WILL TALK ABOUT INCREASING YOUR PHYSICAL ACTIVITY!

## 2012-08-17 NOTE — Progress Notes (Signed)
Medical Nutrition Therapy:  Appt start time: 1530 end time:  1630.  Assessment:  Primary concerns today: Weight management and Blood sugar control.  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  She wants to be able to lose weight in light of her thyroid issue.  She identifies herself as a sugar addict and an emotional eater.  She would also like some ideas for healthy snacks.  She is also concerned about her high blood sugars, for which Dr. Horald Pollen referred her for MNT.  She has done Weight Watchers before, including tracking food intake and physical activity, and she feels her food choices are mostly healthy, but she's frustrated that she can't manage to motivate herself to start exercising regularly again.  Usual physical activity includes none other than yard and housework.  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  Up until she met her husband 10 yrs ago, she had been working out 5 X wk. Usual eating pattern includes 3 meals and 1 snacks per day.  Everyday foods include [employer] Fit Greek yogurt & blueberries for breakfast, 2 c coffee w/ Splenda, salad w/ olive oil or bleu chs & 5 saltines for lunch.  Avoided foods include liver, oysters, organ meats.    24-hr recall: (Up at 6:30) B (9:30 AM)-   [employer] Fit Greek yogurt & blueberries, coffee w/ Splenda Snk ( AM)-   Coffee w/ Splenda L (12 PM)-  [place] food:  Sweet & sour (fried) chx, 1 1/2 c fried rice, 1 egg roll, diet Coke Snk (5:15)-  1 slc bread D (7:30 PM)-  1 ham sandwich w/ mustard, 1 tsp mayo, diet peach tea,  Snk (8 PM)-  2 pcs choc candy Yesterday was atypical in that she went out to lunch, and dinner was not normal b/c of lunch, and she doesn't normally have candy around.  More normal meals include grilled chx salad for lunch and meat/starch/veg's for dinner.    Progress Towards Goal(s):  In progress.   Nutritional Diagnosis:  NB-2.1 Physical inactivity As related to poor  motivation.  As evidenced by no regular activity currently.    Intervention:  Nutrition education.  Monitoring/Evaluation:  Dietary intake, exercise, and body weight in 1 month.

## 2012-08-25 ENCOUNTER — Encounter: Payer: Self-pay | Admitting: Internal Medicine

## 2012-10-03 ENCOUNTER — Encounter: Payer: Self-pay | Admitting: Family Medicine

## 2012-10-03 ENCOUNTER — Ambulatory Visit (INDEPENDENT_AMBULATORY_CARE_PROVIDER_SITE_OTHER): Payer: BC Managed Care – PPO | Admitting: Family Medicine

## 2012-10-03 VITALS — Ht 65.5 in | Wt 203.3 lb

## 2012-10-03 DIAGNOSIS — R739 Hyperglycemia, unspecified: Secondary | ICD-10-CM

## 2012-10-03 DIAGNOSIS — E669 Obesity, unspecified: Secondary | ICD-10-CM

## 2012-10-03 DIAGNOSIS — R7309 Other abnormal glucose: Secondary | ICD-10-CM

## 2012-10-03 NOTE — Patient Instructions (Addendum)
-   Limit fat intake to 45 grams per day.  This will help to keep your calories low.   - Feel free to eat up to 6 oz of meat/fish for dinner.   - Make sure you get ENOUGH food for both breakfast and lunch.   Eating enough early in the day will help you avoid overeating in the evening, especially the sweets.    Include protein, veg's, and a starch food.    (Whole-grain crackers:  Triscuits, Wheat Thins, Ak Maks, Rye Crisp [Wasa], Barbara's "Wheatine's") - Continue to track food intake with your app, including during times you are out of your usual routine. - TASTE PREFERENCES ARE LEARNED.  This means that it will get easier to choose foods you know are good for you if you are exposed to them enough.   - Hyper-palatable foods:  Those loaded with sugar, fat, and/or salt.   - Reminder:  Obtain twice as many veg's as protein or carbohydrate foods for both lunch and dinner.  - As you review your food record, think about how you felt during the day, i.e., When were you hungry?; Did you feel satisfied?; What did you learn from this day's choices?

## 2012-10-03 NOTE — Progress Notes (Signed)
Medical Nutrition Therapy:  Appt start time: 0900 end time:  1000.  Assessment:  Primary concerns today: Weight management and Blood sugar control.  Mary Ward has started using the [employer].  She has been walking on the TM 5 X wk ~35 min, and is weighing herself once a week at home.  She admits to not having started to make any significant dietary changes until [name] 2, however, and last weekend was full of restaurant and takeout foods while she was out of town.  [name]'s husband [name] accompanied her in today's appt.  He is also trying to lose some weight and control BG levels, and has been supportive of Kabrea's efforts.    24-hr recall suggests intake of 1100 kcal:  Up at 6:30 AM B (9:30 AM)-  6 oz [place] yogurt, 1/4 c blueberries, coffee w/ Splenda    100 Snk ( AM)-  none L (12 PM)-  Salad w/ 3 oz [place], tbsp bl chs dressing, pc of cake, diet Pepsi  500 Snk ( PM)-  none D (6 PM)-  1 slc meatloaf, 2 c broccoli, butter spray, diet Pepsi   300 Snk (8 PM)-  Coffee w/ Splenda, 1 pc cake      200  Progress Towards Goal(s):  In progress.   Nutritional Diagnosis:  Progress noted on: NB-2.1 Physical inactivity As related to poor motivation.  As evidenced by 35 min walking 5 X wk for the past two weeks.    Intervention:  Nutrition education.  Monitoring/Evaluation:  Dietary intake, exercise, and body weight in 1 month.

## 2012-10-26 ENCOUNTER — Encounter: Payer: Self-pay | Admitting: Obstetrics and Gynecology

## 2012-10-29 ENCOUNTER — Other Ambulatory Visit: Payer: Self-pay

## 2012-11-02 ENCOUNTER — Ambulatory Visit: Payer: BC Managed Care – PPO | Admitting: Family Medicine

## 2012-11-10 ENCOUNTER — Ambulatory Visit: Payer: BC Managed Care – PPO

## 2012-11-10 ENCOUNTER — Other Ambulatory Visit (INDEPENDENT_AMBULATORY_CARE_PROVIDER_SITE_OTHER): Payer: BC Managed Care – PPO

## 2012-11-10 DIAGNOSIS — Z0000 Encounter for general adult medical examination without abnormal findings: Secondary | ICD-10-CM

## 2012-11-10 LAB — LIPID PANEL
Cholesterol: 148 mg/dL (ref 0–200)
HDL: 47.1 mg/dL (ref >39.00)
LDL Cholesterol: 79 mg/dL (ref 0–99)
Triglycerides: 110 mg/dL (ref 0.0–149.0)
VLDL: 22 mg/dL (ref 0.0–40.0)

## 2012-11-10 LAB — URINALYSIS, ROUTINE W REFLEX MICROSCOPIC
Bilirubin Urine: NEGATIVE
Leukocytes, UA: NEGATIVE
Nitrite: NEGATIVE
Specific Gravity, Urine: 1.025 (ref 1.000–1.030)
pH: 6 (ref 5.0–8.0)

## 2012-11-10 LAB — BASIC METABOLIC PANEL
BUN: 24 mg/dL — ABNORMAL HIGH (ref 6–23)
Chloride: 106 mEq/L (ref 96–112)
GFR: 73.27 mL/min (ref >60.00)
Potassium: 4.2 mEq/L (ref 3.5–5.1)
Sodium: 142 mEq/L (ref 135–145)

## 2012-11-10 LAB — HEPATIC FUNCTION PANEL
Albumin: 3.8 g/dL (ref 3.5–5.2)
Bilirubin, Direct: 0.1 mg/dL (ref 0.0–0.3)
Total Protein: 7.1 g/dL (ref 6.0–8.3)

## 2012-11-10 LAB — CBC WITH DIFFERENTIAL/PLATELET
Basophils Relative: 0.5 % (ref 0.0–3.0)
Eosinophils Absolute: 0.2 10*3/uL (ref 0.0–0.7)
HCT: 44 % (ref 36.0–46.0)
Lymphs Abs: 2.7 10*3/uL (ref 0.7–4.0)
MCHC: 32.9 g/dL (ref 30.0–36.0)
MCV: 83.4 fl (ref 78.0–100.0)
Monocytes Absolute: 0.5 10*3/uL (ref 0.1–1.0)
Neutrophils Relative %: 56.5 % (ref 43.0–77.0)
RBC: 5.28 Mil/uL — ABNORMAL HIGH (ref 3.87–5.11)

## 2012-11-15 ENCOUNTER — Ambulatory Visit (INDEPENDENT_AMBULATORY_CARE_PROVIDER_SITE_OTHER): Payer: BC Managed Care – PPO | Admitting: Internal Medicine

## 2012-11-15 ENCOUNTER — Encounter: Payer: Self-pay | Admitting: Internal Medicine

## 2012-11-15 VITALS — BP 132/82 | HR 73 | Temp 97.9°F | Ht 65.5 in | Wt 201.1 lb

## 2012-11-15 DIAGNOSIS — Z0000 Encounter for general adult medical examination without abnormal findings: Secondary | ICD-10-CM

## 2012-11-15 MED ORDER — ATORVASTATIN CALCIUM 20 MG PO TABS: 20.0000 mg | ORAL_TABLET | Freq: Every day | ORAL | Status: DC

## 2012-11-15 MED ORDER — TELMISARTAN-HCTZ 40-12.5 MG PO TABS: 1.0000 | ORAL_TABLET | Freq: Every day | ORAL | Status: DC

## 2012-11-15 MED ORDER — LEVOTHYROXINE SODIUM 125 MCG PO TABS: 125.0000 ug | ORAL_TABLET | Freq: Every day | ORAL | Status: DC

## 2012-11-15 MED ORDER — METFORMIN HCL 500 MG PO TABS: 500.0000 mg | ORAL_TABLET | Freq: Every day | ORAL | Status: DC

## 2012-11-15 NOTE — Progress Notes (Signed)
Subjective:    Patient ID: Mary Ward, female    DOB: 10/18/1945, 67 y.o.   MRN: [id number]  HPI  Here for wellness and f/u;  Overall doing ok;  Pt denies CP, worsening SOB, DOE, wheezing, orthopnea, PND, worsening LE edema, palpitations, dizziness or syncope.  Pt denies neurological change such as new headache, facial or extremity weakness.  Pt denies polydipsia, polyuria, or low sugar symptoms. Pt states overall good compliance with treatment and medications, good tolerability, and has been trying to follow lower cholesterol diet.  Pt denies worsening depressive symptoms, suicidal ideation or panic. No fever, night sweats, wt loss, loss of appetite, or other constitutional symptoms.  Pt states good ability with ADL's, has low fall risk, home safety reviewed and adequate, no other significant changes in hearing or vision, and only occasionally active with exercise.  No acute complaints Past Medical History  Diagnosis Date  . Hyperlipidemia   . Obesity   . Anxiety   . Hypertension   . Hypothyroidism   . GERD (gastroesophageal reflux disease)   . EN (erythema nodosum)   . Hyperplastic colonic polyp    Past Surgical History  Procedure Laterality Date  . Oophorectomy    . Tonsillectomy    . Breast reduction surgery  2002  . Hammer toe surgery      right 2nd toe  . Laparoscopic assisted vaginal hysterectomy  1992    with BSO  . Abdominal hysterectomy      reports that she has never smoked. She does not have any smokeless tobacco history on file. She reports that  drinks alcohol. Her drug history is not on file. family history includes Cancer in her father; Diabetes in her sister; Heart disease in her sister; Hyperlipidemia in her mother; Hypertension in her mother; and Stroke in her mother. Allergies  Allergen Reactions  . Indomethacin   . Loratadine   . Prednisone   . Tetanus Toxoid   . Tetracycline    Current Outpatient Prescriptions on File Prior to Visit  Medication Sig  Dispense Refill  . aspirin 81 MG tablet Take 81 mg by mouth daily.        . ergocalciferol (VITAMIN D2) 50000 UNIT capsule Take 50,000 Units by mouth once a week. Take 1 tablet by mouth once every 2 weeks        No current facility-administered medications on file prior to visit.   Review of Systems Constitutional: Negative for diaphoresis, activity change, appetite change or unexpected weight change.  HENT: Negative for hearing loss, ear pain, facial swelling, mouth sores and neck stiffness.   Eyes: Negative for pain, redness and visual disturbance.  Respiratory: Negative for shortness of breath and wheezing.   Cardiovascular: Negative for chest pain and palpitations.  Gastrointestinal: Negative for diarrhea, blood in stool, abdominal distention or other pain Genitourinary: Negative for hematuria, flank pain or change in urine volume.  Musculoskeletal: Negative for myalgias and joint swelling.  Skin: Negative for color change and wound.  Neurological: Negative for syncope and numbness. other than noted Hematological: Negative for adenopathy.  Psychiatric/Behavioral: Negative for hallucinations, self-injury, decreased concentration and agitation.      Objective:   Physical Exam BP 132/82  Pulse 73  Temp(Src) 97.9 F (36.6 C) (Oral)  Ht 5' 5.5" (1.664 m)  Wt 201 lb 2 oz (91.23 kg)  BMI 32.95 kg/m2  SpO2 94% VS noted,  Constitutional: Pt is oriented to person, place, and time. Appears well-developed and well-nourished.  Head: Normocephalic and  atraumatic.  Right Ear: External ear normal.  Left Ear: External ear normal.  Nose: Nose normal.  Mouth/Throat: Oropharynx is clear and moist.  Eyes: Conjunctivae and EOM are normal. Pupils are equal, round, and reactive to light.  Neck: Normal range of motion. Neck supple. No JVD present. No tracheal deviation present.  Cardiovascular: Normal rate, regular rhythm, normal heart sounds and intact distal pulses.   Pulmonary/Chest: Effort  normal and breath sounds normal.  Abdominal: Soft. Bowel sounds are normal. There is no tenderness. No HSM  Musculoskeletal: Normal range of motion. Exhibits no edema.  Lymphadenopathy:  Has no cervical adenopathy.  Neurological: Pt is alert and oriented to person, place, and time. Pt has normal reflexes. No cranial nerve deficit.  Skin: Skin is warm and dry. No rash noted.  Psychiatric:  Has  normal mood and affect. Behavior is normal.     Assessment & Plan:

## 2012-11-15 NOTE — Assessment & Plan Note (Signed)
stable overall by history and exam, recent data reviewed with pt, and pt to continue medical treatment as before,  to f/u any worsening symptoms or concerns Lab Results  Component Value Date   HGBA1C 6.0 11/10/2012

## 2012-11-15 NOTE — Patient Instructions (Addendum)
You had the pneumonia shot today Please continue all other medications as before, and refills have been done to [facility] Please have the pharmacy call with any other refills you may need. Please continue your efforts at being more active, low cholesterol diet, and weight control. You are otherwise up to date with prevention measures today. Please remember to followup with your GYN for the yearly pap smear and/or mammogram, as you do You will need colonoscopy followup after dec 2014 Thank you for enrolling in MyChart. Please follow the instructions below to securely access your online medical record. MyChart allows you to send messages to your doctor, view your test results, renew your prescriptions, schedule appointments, and more. Please return in 1 year for your yearly visit, or sooner if needed, with Lab testing done 3-5 days before

## 2012-11-15 NOTE — Assessment & Plan Note (Addendum)

## 2012-12-15 ENCOUNTER — Other Ambulatory Visit: Payer: Self-pay | Admitting: Dermatology

## 2012-12-15 HISTORY — PX: CYST EXCISION: SHX5701

## 2012-12-26 ENCOUNTER — Ambulatory Visit (INDEPENDENT_AMBULATORY_CARE_PROVIDER_SITE_OTHER): Payer: BC Managed Care – PPO | Admitting: Obstetrics and Gynecology

## 2012-12-26 ENCOUNTER — Encounter: Payer: Self-pay | Admitting: Obstetrics and Gynecology

## 2012-12-26 VITALS — BP 120/76 | Ht 65.5 in | Wt 207.0 lb

## 2012-12-26 DIAGNOSIS — Z01419 Encounter for gynecological examination (general) (routine) without abnormal findings: Secondary | ICD-10-CM

## 2012-12-26 NOTE — Progress Notes (Signed)
67 y.o.  Married  Caucasian female   G0P0 here for annual exam. Stopped taking her metformin because it causes constipation and gas, and didn't decrease her cravings as she hoped it would. Her  A1C was normal, so patient not too concerned.   Endocrinologist decreased her synthroid to try to improve her sleep, and it has a little.   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  Should know within about 4 months.  Patient's last menstrual period was 09/13/1994.          Sexually active: no  The current method of family planning is abstinence.    Exercising: not now Last mammogram:  08/14/12 neg Last pap smear:07/03/03 neg History of abnormal pap: no Smoking: no Alcohol: occ glass of wine Last colonoscopy: 08/21/03 normal every 10 years Last Bone Density:  09/13/2010 slight bone loss in hip per pt. Last tetanus shot: 1996 Last cholesterol check: 10/2012 normal  Hgb:        pcp        Urine: pcp    Health Maintenance  Topic Date Due  . Tetanus/tdap  09/13/2004  . Influenza Vaccine  05/14/2013  . Colonoscopy  08/20/2013  . Mammogram  08/14/2014  . Pneumococcal Polysaccharide Vaccine Age 29 And Over  Completed  . Zostavax  Completed    Family History  Problem Relation Age of Onset  . Hyperlipidemia Mother   . Stroke Mother   . Hypertension Mother   . Heart attack Mother   . Cancer Father     Lung  . Diabetes Sister   . Heart disease Sister     CAD  . Hypertension Sister     Patient Active Problem List  Diagnosis  . HYPOTHYROIDISM  . HYPERLIPIDEMIA  . OBESITY  . ANXIETY  . HYPERTENSION  . GERD  . MENOPAUSAL DISORDER  . Erythema nodosum  . ELECTROCARDIOGRAM, ABNORMAL  . COLONIC POLYPS, HX OF  . Preventative health care  . Type II or unspecified type diabetes mellitus without mention of complication, uncontrolled    Past Medical History  Diagnosis Date  . Hyperlipidemia   . Obesity   . Anxiety   . Hypertension   .  Hypothyroidism   . GERD (gastroesophageal reflux disease)   . EN (erythema nodosum)   . Hyperplastic colonic polyp     Past Surgical History  Procedure Laterality Date  . Oophorectomy    . Tonsillectomy    . Breast reduction surgery  2002  . Hammer toe surgery      right 2nd toe  . Laparoscopic assisted vaginal hysterectomy  1992    with BSO  . Abdominal hysterectomy    . Cyst excision  12/15/12    punch biopsy left shoulder blade    Allergies: Indomethacin; Loratadine; Prednisone; Tetanus toxoid; and Tetracycline  Current Outpatient Prescriptions  Medication Sig Dispense Refill  . aspirin 81 MG tablet Take 81 mg by mouth daily.        Marland Kitchen atorvastatin (LIPITOR) 20 MG tablet Take 1 tablet (20 mg total) by mouth daily.  90 tablet  3  . diphenhydrAMINE (BENADRYL) 12.5 MG/5ML elixir Take by mouth as needed for allergies.      Marland Kitchen ergocalciferol (VITAMIN D2) 50000 UNIT capsule Take 50,000 Units by mouth once a week. Take 1 tablet by mouth once every 2 weeks       . levothyroxine (SYNTHROID, LEVOTHROID) 125 MCG tablet Take 1 tablet (125 mcg  total) by mouth daily.  90 tablet  3  . telmisartan-hydrochlorothiazide (MICARDIS HCT) 40-12.5 MG per tablet Take 1 tablet by mouth daily.  90 tablet  3  . metFORMIN (GLUCOPHAGE) 500 MG tablet Take 1 tablet (500 mg total) by mouth daily.  90 tablet  3   No current facility-administered medications for this visit.    ROS: Pertinent items are noted in HPI.  Social Hx: divorced and remarried in 2005 by Elsie Ra.  Husband Dannielle Huh works as a Marine scientist.  No children. Pt works in [employer]  Getting ready for a 9 day trip to [facility], [address], [place] for their 9th anniversary.    Exam:    BP 120/76  Ht 5' 5.5" (1.664 m)  Wt 207 lb (93.895 kg)  BMI 33.91 kg/m2  LMP 09/13/1994   Wt Readings from Last 3 Encounters:  12/26/12 207 lb (93.895 kg)  11/15/12 201 lb 2 oz (91.23 kg)   10/03/12 203 lb 4.8 oz (92.216 kg)     Ht Readings from Last 3 Encounters:  12/26/12 5' 5.5" (1.664 m)  11/15/12 5' 5.5" (1.664 m)  10/03/12 5' 5.5" (1.664 m)    General appearance: alert, cooperative and appears stated age Head: Normocephalic, without obvious abnormality, atraumatic Neck: no adenopathy, supple, symmetrical, trachea midline and thyroid not enlarged, symmetric, no tenderness/mass/nodules Lungs: clear to auscultation bilaterally Breasts: Inspection negative, No nipple retraction or dimpling, No nipple discharge or bleeding, No axillary or supraclavicular adenopathy, Normal to palpation without dominant masses. S/p reduction Heart: regular rate and rhythm Abdomen: soft, non-tender; bowel sounds normal; no masses,  no organomegaly Extremities: extremities normal, atraumatic, no cyanosis or edema Skin: Skin color, texture, turgor normal. No rashes or lesions Lymph nodes: Cervical, supraclavicular, and axillary nodes normal. No abnormal inguinal nodes palpated Neurologic: Grossly normal   Pelvic: External genitalia:  no lesions              Urethra:  normal appearing urethra with no masses, tenderness or lesions              Bartholins and Skenes: normal                 Vagina: normal appearing vagina with normal color and discharge, no lesions, atrophic                         Pap taken: no        Bimanual Exam:   No pelvic masses or nodularity.     Rectovaginal: Confirms                                      Anus:  normal sphincter tone, no lesions  A: normal menopausal exam, no HRT - quit in Oct 2008 when her sister had an MI and pt got scared of HRT     S/p LAVH/BSO d/t fibroids, endometriosis     HTN, high chol, low thyroid, type II diabetes    P: mammogram counseled on breast self exam, mammography screening, adequate intake of calcium and vitamin D, diet and exercise return annually or prn   Get colonoscopy this year (due in Dec)  An After Visit Summary was  printed and given to the patient.

## 2012-12-26 NOTE — Patient Instructions (Addendum)

## 2013-05-08 ENCOUNTER — Ambulatory Visit (INDEPENDENT_AMBULATORY_CARE_PROVIDER_SITE_OTHER): Payer: BC Managed Care – PPO | Admitting: Internal Medicine

## 2013-05-08 ENCOUNTER — Encounter: Payer: Self-pay | Admitting: Internal Medicine

## 2013-05-08 VITALS — BP 118/80 | HR 68 | Temp 97.8°F | Ht 65.5 in | Wt 207.2 lb

## 2013-05-08 DIAGNOSIS — E785 Hyperlipidemia, unspecified: Secondary | ICD-10-CM

## 2013-05-08 DIAGNOSIS — I10 Essential (primary) hypertension: Secondary | ICD-10-CM

## 2013-05-08 DIAGNOSIS — E039 Hypothyroidism, unspecified: Secondary | ICD-10-CM

## 2013-05-08 MED ORDER — LEVOTHYROXINE SODIUM 137 MCG PO TABS: 137.0000 ug | ORAL_TABLET | Freq: Every day | ORAL | Status: DC

## 2013-05-08 NOTE — Assessment & Plan Note (Signed)
Ok for synthroid to 137 qd,  to f/u any worsening symptoms or concerns Lab Results  Component Value Date   TSH 0.62 11/10/2012

## 2013-05-08 NOTE — Assessment & Plan Note (Signed)
stable overall by history and exam, recent data reviewed with pt, and pt to continue medical treatment as before,  to f/u any worsening symptoms or concerns Lab Results  Component Value Date   LDLCALC 79 11/10/2012

## 2013-05-08 NOTE — Progress Notes (Signed)
Subjective:    Patient ID: Mary Ward, female    DOB: 03/05/1946, 67 y.o.   MRN: [id number]  HPI  Here to f/u; Lost  3 lbs with better diet in the past wk after seeing endo, cold not tolerate metformin for wt loss and reduced appetite.    Synthroid reduced from 137 to 125 per endo but now with reduced energy and focus, asks to go back to 137 since her TSH was normal when taking this feb 2014.  Working with an app on phone called [employer] to track calories and exercise, plans to follow closely.  Husband doing the same as has new DM on metformin.   Past Medical History  Diagnosis Date  . Hyperlipidemia   . Obesity   . Anxiety   . Hypertension   . Hypothyroidism   . GERD (gastroesophageal reflux disease)   . EN (erythema nodosum)   . Hyperplastic colonic polyp    Past Surgical History  Procedure Laterality Date  . Oophorectomy    . Tonsillectomy    . Breast reduction surgery  2002  . Hammer toe surgery      right 2nd toe  . Laparoscopic assisted vaginal hysterectomy  1992    with BSO  . Abdominal hysterectomy    . Cyst excision  12/15/12    punch biopsy left shoulder blade    reports that she has never smoked. She does not have any smokeless tobacco history on file. She reports that she drinks about 0.5 ounces of alcohol per week. She reports that she does not use illicit drugs. family history includes Cancer in her father; Diabetes in her sister; Heart attack in her mother; Heart disease in her sister; Hyperlipidemia in her mother; Hypertension in her mother and sister; Stroke in her mother. Allergies  Allergen Reactions  . Indomethacin   . Loratadine   . Prednisone   . Tetanus Toxoid   . Tetracycline    Current Outpatient Prescriptions on File Prior to Visit  Medication Sig Dispense Refill  . aspirin 81 MG tablet Take 81 mg by mouth daily.        Marland Kitchen atorvastatin (LIPITOR) 20 MG tablet Take 1 tablet (20 mg total) by mouth daily.  90 tablet  3  . diphenhydrAMINE (BENADRYL) 12.5  MG/5ML elixir Take by mouth as needed for allergies.      Marland Kitchen levothyroxine (SYNTHROID, LEVOTHROID) 125 MCG tablet Take 1 tablet (125 mcg total) by mouth daily.  90 tablet  3  . telmisartan-hydrochlorothiazide (MICARDIS HCT) 40-12.5 MG per tablet Take 1 tablet by mouth daily.  90 tablet  3   No current facility-administered medications on file prior to visit.   Review of Systems  Constitutional: Negative for unexpected weight change, or unusual diaphoresis  HENT: Negative for tinnitus.   Eyes: Negative for photophobia and visual disturbance.  Respiratory: Negative for choking and stridor.   Gastrointestinal: Negative for vomiting and blood in stool.  Genitourinary: Negative for hematuria and decreased urine volume.  Musculoskeletal: Negative for acute joint swelling Skin: Negative for color change and wound.  Neurological: Negative for tremors and numbness other than noted  Psychiatric/Behavioral: Negative for decreased concentration or  hyperactivity.       Objective:   Physical Exam BP 118/80  Pulse 68  Temp(Src) 97.8 F (36.6 C) (Oral)  Ht 5' 5.5" (1.664 m)  Wt 207 lb 4 oz (94.008 kg)  BMI 33.95 kg/m2  SpO2 97%  LMP 09/13/1994 VS noted,  Constitutional: Pt appears  well-developed and well-nourished.  HENT: Head: NCAT.  Right Ear: External ear normal.  Left Ear: External ear normal.  Eyes: Conjunctivae and EOM are normal. Pupils are equal, round, and reactive to light.  Neck: Normal range of motion. Neck supple.  Cardiovascular: Normal rate and regular rhythm.   Pulmonary/Chest: Effort normal and breath sounds normal.  Abd:  Soft, NT, non-distended, + BS Neurological: Pt is alert. Not confused  Skin: Skin is warm. No erythema.  Psychiatric: Pt behavior is normal. Thought content normal.     Assessment & Plan:

## 2013-05-08 NOTE — Patient Instructions (Signed)
Please take all new medication as prescribed Please continue all other medications as before  Please continue your efforts at being more active, low cholesterol diet, and weight control.

## 2013-05-08 NOTE — Assessment & Plan Note (Signed)
stable overall by history and exam, recent data reviewed with pt, and pt to continue medical treatment as before,  to f/u any worsening symptoms or concerns BP Readings from Last 3 Encounters:  05/08/13 118/80  12/26/12 120/76  11/15/12 132/82

## 2013-06-06 ENCOUNTER — Encounter: Payer: Self-pay | Admitting: Gastroenterology

## 2013-07-19 ENCOUNTER — Other Ambulatory Visit: Payer: Self-pay

## 2013-10-10 LAB — HM MAMMOGRAPHY

## 2013-11-12 ENCOUNTER — Other Ambulatory Visit (INDEPENDENT_AMBULATORY_CARE_PROVIDER_SITE_OTHER): Payer: BC Managed Care – PPO

## 2013-11-12 DIAGNOSIS — R7309 Other abnormal glucose: Secondary | ICD-10-CM

## 2013-11-12 DIAGNOSIS — R7302 Impaired glucose tolerance (oral): Secondary | ICD-10-CM

## 2013-11-12 DIAGNOSIS — Z0000 Encounter for general adult medical examination without abnormal findings: Secondary | ICD-10-CM

## 2013-11-12 LAB — CBC WITH DIFFERENTIAL/PLATELET
BASOS PCT: 0.4 % (ref 0.0–3.0)
Basophils Absolute: 0 10*3/uL (ref 0.0–0.1)
EOS PCT: 2.4 % (ref 0.0–5.0)
Eosinophils Absolute: 0.2 10*3/uL (ref 0.0–0.7)
HCT: 45.7 % (ref 36.0–46.0)
Hemoglobin: 14.9 g/dL (ref 12.0–15.0)
Lymphocytes Relative: 31 % (ref 12.0–46.0)
Lymphs Abs: 2.6 10*3/uL (ref 0.7–4.0)
MCHC: 32.5 g/dL (ref 30.0–36.0)
MCV: 85.4 fl (ref 78.0–100.0)
MONO ABS: 0.5 10*3/uL (ref 0.1–1.0)
Monocytes Relative: 6.5 % (ref 3.0–12.0)
NEUTROS ABS: 5 10*3/uL (ref 1.4–7.7)
Neutrophils Relative %: 59.7 % (ref 43.0–77.0)
Platelets: 147 10*3/uL — ABNORMAL LOW (ref 150.0–400.0)
RBC: 5.35 Mil/uL — AB (ref 3.87–5.11)
RDW: 12.8 % (ref 11.5–14.6)
WBC: 8.4 10*3/uL (ref 4.5–10.5)

## 2013-11-12 LAB — HEPATIC FUNCTION PANEL
ALBUMIN: 3.9 g/dL (ref 3.5–5.2)
ALT: 24 U/L (ref 0–35)
AST: 21 U/L (ref 0–37)
Alkaline Phosphatase: 83 U/L (ref 39–117)
Bilirubin, Direct: 0.1 mg/dL (ref 0.0–0.3)
TOTAL PROTEIN: 7.3 g/dL (ref 6.0–8.3)
Total Bilirubin: 1 mg/dL (ref 0.3–1.2)

## 2013-11-12 LAB — URINALYSIS, ROUTINE W REFLEX MICROSCOPIC
BILIRUBIN URINE: NEGATIVE
Ketones, ur: NEGATIVE
Leukocytes, UA: NEGATIVE
NITRITE: NEGATIVE
Specific Gravity, Urine: 1.02 (ref 1.000–1.030)
Total Protein, Urine: NEGATIVE
Urine Glucose: NEGATIVE
Urobilinogen, UA: 0.2 (ref 0.0–1.0)
pH: 5.5 (ref 5.0–8.0)

## 2013-11-12 LAB — BASIC METABOLIC PANEL
BUN: 17 mg/dL (ref 6–23)
CHLORIDE: 105 meq/L (ref 96–112)
CO2: 25 mEq/L (ref 19–32)
Calcium: 9 mg/dL (ref 8.4–10.5)
Creatinine, Ser: 0.8 mg/dL (ref 0.4–1.2)
GFR: 77.32 mL/min (ref >60.00)
Glucose, Bld: 98 mg/dL (ref 70–99)
Potassium: 3.8 mEq/L (ref 3.5–5.1)
Sodium: 139 mEq/L (ref 135–145)

## 2013-11-12 LAB — LIPID PANEL
CHOLESTEROL: 163 mg/dL (ref 0–200)
HDL: 49.9 mg/dL (ref >39.00)
LDL CALC: 93 mg/dL (ref 0–99)
Total CHOL/HDL Ratio: 3
Triglycerides: 99 mg/dL (ref 0.0–149.0)
VLDL: 19.8 mg/dL (ref 0.0–40.0)

## 2013-11-12 LAB — HEMOGLOBIN A1C: Hgb A1c MFr Bld: 6.2 % (ref 4.6–6.5)

## 2013-11-12 LAB — TSH: TSH: 0.5 u[IU]/mL (ref 0.35–5.50)

## 2013-11-16 ENCOUNTER — Encounter: Payer: Self-pay | Admitting: Internal Medicine

## 2013-11-16 ENCOUNTER — Ambulatory Visit (INDEPENDENT_AMBULATORY_CARE_PROVIDER_SITE_OTHER): Payer: BC Managed Care – PPO | Admitting: Internal Medicine

## 2013-11-16 ENCOUNTER — Other Ambulatory Visit: Payer: Self-pay | Admitting: Internal Medicine

## 2013-11-16 ENCOUNTER — Ambulatory Visit (INDEPENDENT_AMBULATORY_CARE_PROVIDER_SITE_OTHER)
Admission: RE | Admit: 2013-11-16 | Discharge: 2013-11-16 | Disposition: A | Discharge status: Home / Self Care | Payer: BC Managed Care – PPO | Source: Ambulatory Visit | Attending: Internal Medicine | Admitting: Internal Medicine

## 2013-11-16 VITALS — BP 112/80 | HR 75 | Temp 98.1°F | Ht 65.0 in | Wt 207.2 lb

## 2013-11-16 DIAGNOSIS — R0609 Other forms of dyspnea: Secondary | ICD-10-CM

## 2013-11-16 DIAGNOSIS — R0989 Other specified symptoms and signs involving the circulatory and respiratory systems: Secondary | ICD-10-CM

## 2013-11-16 DIAGNOSIS — R0600 Dyspnea, unspecified: Secondary | ICD-10-CM

## 2013-11-16 DIAGNOSIS — Z0000 Encounter for general adult medical examination without abnormal findings: Secondary | ICD-10-CM

## 2013-11-16 DIAGNOSIS — Z23 Encounter for immunization: Secondary | ICD-10-CM

## 2013-11-16 DIAGNOSIS — R9389 Abnormal findings on diagnostic imaging of other specified body structures: Secondary | ICD-10-CM

## 2013-11-16 IMAGING — CR DG CHEST 2V
2 series · 2 of 2 positions shown · non-contrast
Comparison: DG CHEST 2 VIEW dated [DATE]

CLINICAL DATA: Dyspnea.  History of lung cancer.

EXAM:
CHEST  2 VIEW

[view not recorded (1 of 2)]
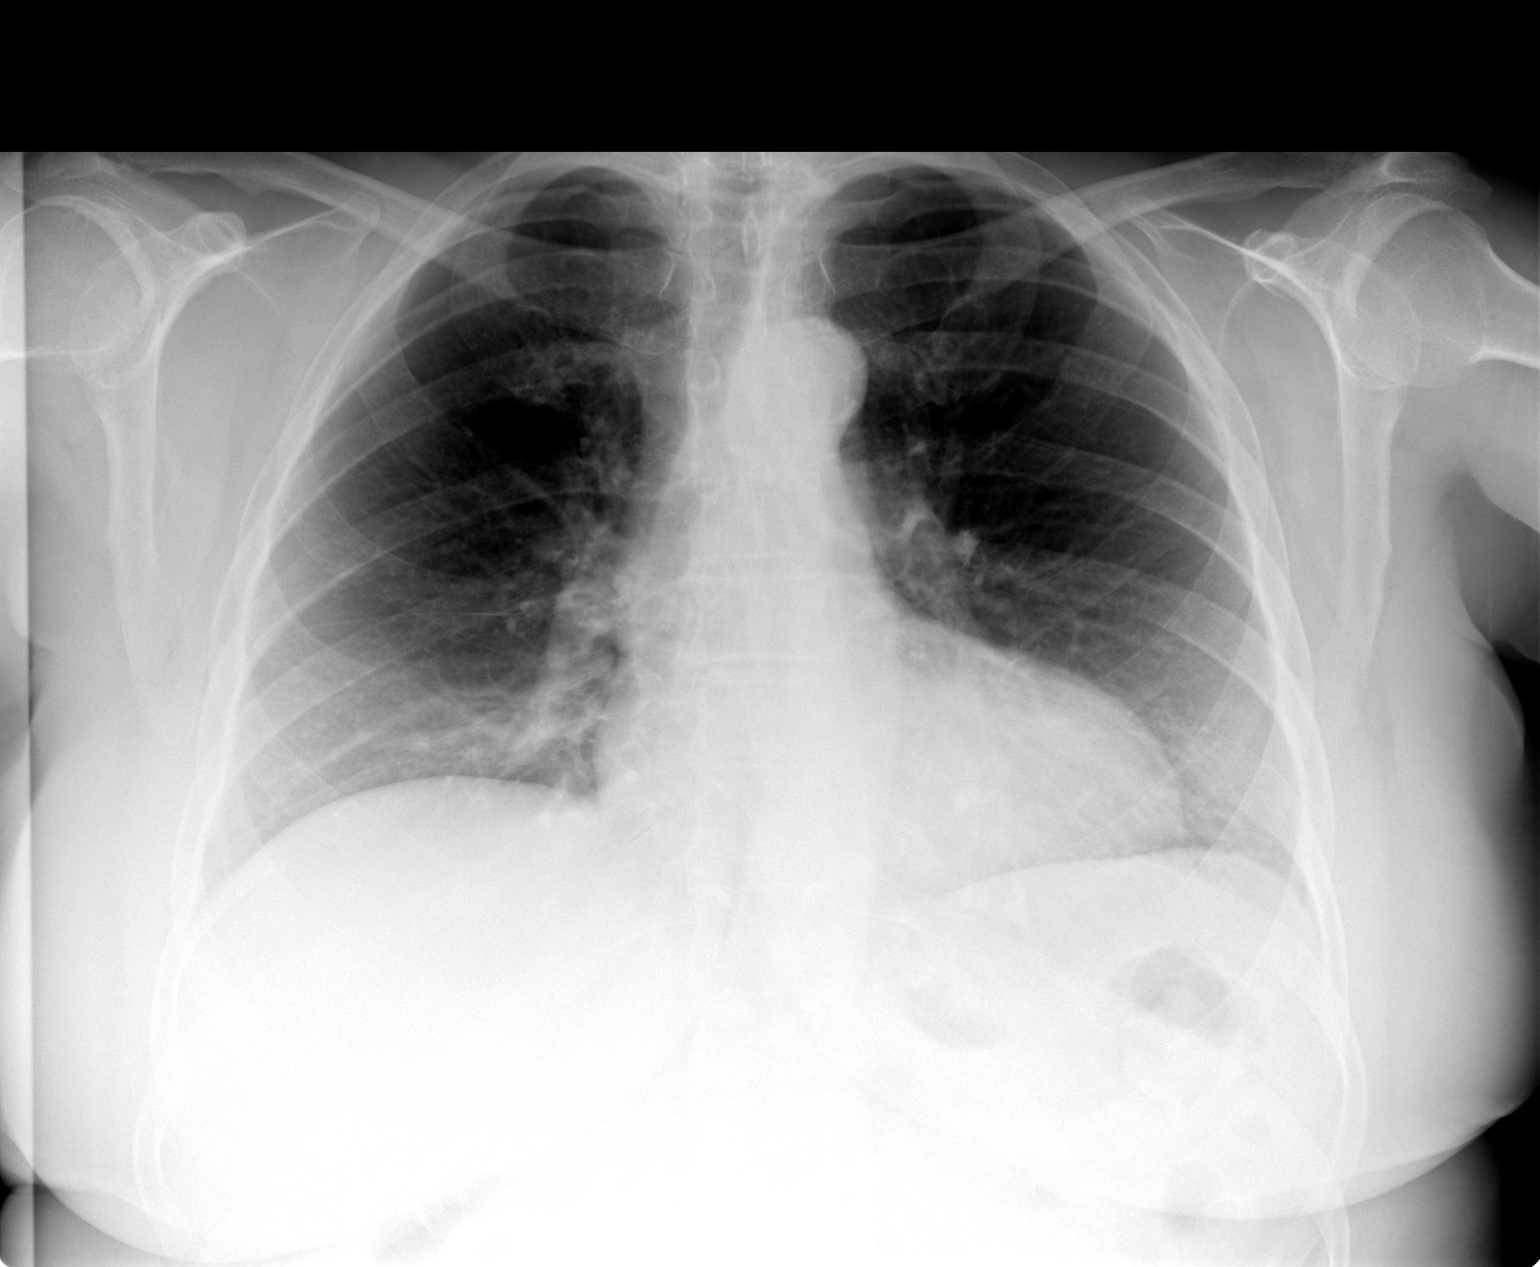

[view not recorded (2 of 2)]
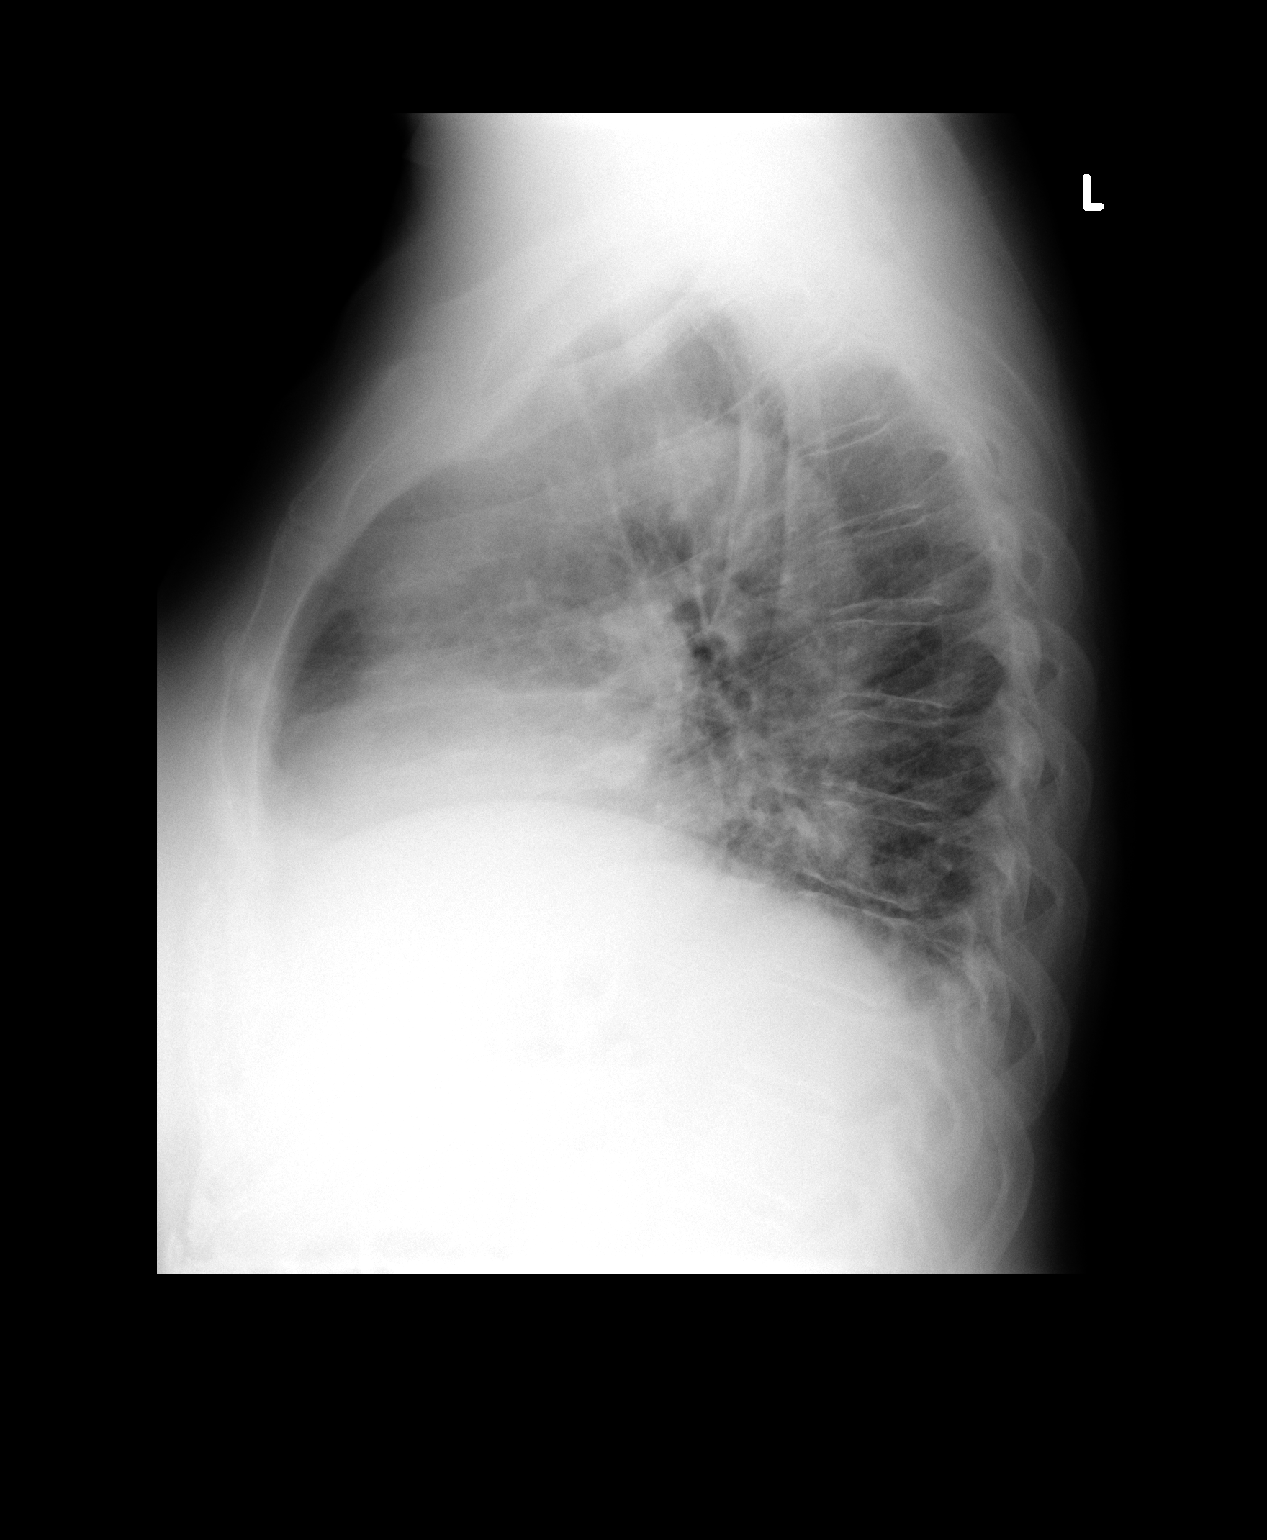

[2 of 2 positions shown; findings below may reference images not displayed]

FINDINGS: Cardiothoracic index 53%, partially attributable to the low lung
volumes. Mild central airway thickening. Mild bandlike opacity in
the right middle lobe and in the lower lobes, favoring subsegmental
atelectasis borderline prominence of anterior mediastinal tissues on
the lateral projection, but without mediastinal widening on the
frontal view. No pleural effusion.
IMPRESSION: 1. Borderline cardiomegaly. Although the cardiothoracic index is
mildly enlarged at 53%, low lung volumes are present which
accentuate the cardiac size and cause crowding of the pulmonary
vasculature.
2. Airway thickening is present, suggesting bronchitis or reactive
airways disease.
3. Subsegmental atelectasis in the lung bases.
4. Slightly prominent anterior mediastinal density. This is probably
incidental and partially attributable to the low lung volumes. If
warranted by the patient's clinical symptoms and presentation, CT
scan of the chest could be utilized to clear the mediastinum.

## 2013-11-16 MED ORDER — LEVOTHYROXINE SODIUM 137 MCG PO TABS: 137.0000 ug | ORAL_TABLET | Freq: Every day | ORAL | Status: DC

## 2013-11-16 MED ORDER — TELMISARTAN-HCTZ 40-12.5 MG PO TABS: 1.0000 | ORAL_TABLET | Freq: Every day | ORAL | Status: DC

## 2013-11-16 MED ORDER — ATORVASTATIN CALCIUM 20 MG PO TABS
20.0000 mg | ORAL_TABLET | Freq: Every day | ORAL | Status: DC
Start: 2013-11-16 — End: 2014-12-19

## 2013-11-16 NOTE — Progress Notes (Signed)
Pre visit review using our clinic review tool, if applicable. No additional management support is needed unless otherwise documented below in the visit note. 

## 2013-11-16 NOTE — Assessment & Plan Note (Signed)

## 2013-11-16 NOTE — Progress Notes (Signed)
Subjective:    Patient ID: Mary Ward, female    DOB: 22-Oct-1945, 68 y.o.   MRN: [id number]  HPI  Here for wellness and f/u;  Overall doing ok;  Pt denies CP, worsening SOB, DOE, wheezing, orthopnea, PND, worsening LE edema, palpitations, dizziness or syncope.  Pt denies neurological change such as new headache, facial or extremity weakness.  Pt denies polydipsia, polyuria, or low sugar symptoms. Pt states overall good compliance with treatment and medications, good tolerability, and has been trying to follow lower cholesterol diet.  Pt denies worsening depressive symptoms, suicidal ideation or panic. No fever, night sweats, wt loss, loss of appetite, or other constitutional symptoms.  Pt states good ability with ADL's, has low fall risk, home safety reviewed and adequate, no other significant changes in hearing or vision, and only occasionally active with exercise. Hard to lose wt. Sees endo for thyroid as well.  No new complaitns.  Much stress at work. Due for colonoscopy. Asks for cxr with very mild sob, last PFT's normal, 2 coworkers with lung cancer Past Medical History  Diagnosis Date  . Hyperlipidemia   . Obesity   . Anxiety   . Hypertension   . Hypothyroidism   . GERD (gastroesophageal reflux disease)   . EN (erythema nodosum)   . Hyperplastic colonic polyp    Past Surgical History  Procedure Laterality Date  . Oophorectomy    . Tonsillectomy    . Breast reduction surgery  2002  . Hammer toe surgery      right 2nd toe  . Laparoscopic assisted vaginal hysterectomy  1992    with BSO  . Abdominal hysterectomy    . Cyst excision  12/15/12    punch biopsy left shoulder blade    reports that she has never smoked. She does not have any smokeless tobacco history on file. She reports that she drinks about 0.5 ounces of alcohol per week. She reports that she does not use illicit drugs. family history includes Cancer in her father; Diabetes in her sister; Heart attack in her mother;  Heart disease in her sister; Hyperlipidemia in her mother; Hypertension in her mother and sister; Stroke in her mother. Allergies  Allergen Reactions  . Indomethacin   . Loratadine   . Prednisone   . Tetanus Toxoid   . Tetracycline    Current Outpatient Prescriptions on File Prior to Visit  Medication Sig Dispense Refill  . aspirin 81 MG tablet Take 81 mg by mouth daily.        . diphenhydrAMINE (BENADRYL) 12.5 MG/5ML elixir Take by mouth as needed for allergies.       No current facility-administered medications on file prior to visit.     Review of Systems Constitutional: Negative for diaphoresis, activity change, appetite change or unexpected weight change.  HENT: Negative for hearing loss, ear pain, facial swelling, mouth sores and neck stiffness.   Eyes: Negative for pain, redness and visual disturbance.  Respiratory: Negative for shortness of breath and wheezing.   Cardiovascular: Negative for chest pain and palpitations.  Gastrointestinal: Negative for diarrhea, blood in stool, abdominal distention or other pain Genitourinary: Negative for hematuria, flank pain or change in urine volume.  Musculoskeletal: Negative for myalgias and joint swelling.  Skin: Negative for color change and wound.  Neurological: Negative for syncope and numbness. other than noted Hematological: Negative for adenopathy.  Psychiatric/Behavioral: Negative for hallucinations, self-injury, decreased concentration and agitation.      Objective:   Physical Exam BP  112/80  Pulse 75  Temp(Src) 98.1 F (36.7 C) (Oral)  Ht 5\' 5"  (1.651 m)  Wt 207 lb 4 oz (94.008 kg)  BMI 34.49 kg/m2  SpO2 95%  LMP 09/13/1994 VS noted,  Constitutional: Pt is oriented to person, place, and time. Appears well-developed and well-nourished.  Head: Normocephalic and atraumatic.  Right Ear: External ear normal.  Left Ear: External ear normal.  Nose: Nose normal.  Mouth/Throat: Oropharynx is clear and moist.  Eyes:  Conjunctivae and EOM are normal. Pupils are equal, round, and reactive to light.  Neck: Normal range of motion. Neck supple. No JVD present. No tracheal deviation present.  Cardiovascular: Normal rate, regular rhythm, normal heart sounds and intact distal pulses.   Pulmonary/Chest: Effort normal and breath sounds normal.  Abdominal: Soft. Bowel sounds are normal. There is no tenderness. No HSM  Musculoskeletal: Normal range of motion. Exhibits no edema.  Lymphadenopathy:  Has no cervical adenopathy.  Neurological: Pt is alert and oriented to person, place, and time. Pt has normal reflexes. No cranial nerve deficit.  Skin: Skin is warm and dry. No rash noted.  Psychiatric:  Has  normal mood and affect. Behavior is normal.      Assessment & Plan:

## 2013-11-16 NOTE — Patient Instructions (Addendum)
You had the new Prevnar pneumonia shot Your EKG was OK today Please continue all other medications as before, and refills have been done if requested. Please have the pharmacy call with any other refills you may need. Please continue your efforts at being more active, low cholesterol diet, and weight control. You are otherwise up to date with prevention measures today.  You will be contacted regarding the referral for: colonoscopy  Please keep your appointments with your specialists as you have planned  Please go to the XRAY Department in the Basement (go straight as you get off the elevator) for the x-ray testing  Please remember to sign up for MyChart if you have not done so, as this will be important to you in the future with finding out test results, communicating by private email, and scheduling acute appointments online when needed.  Please return in 1 year for your yearly visit, or sooner if needed, with Lab testing done 3-5 days before

## 2013-11-16 NOTE — Addendum Note (Signed)
Addended by: Sharon Seller B on: 11/16/2013 09:40 AM   Modules accepted: Orders

## 2013-11-21 ENCOUNTER — Ambulatory Visit (INDEPENDENT_AMBULATORY_CARE_PROVIDER_SITE_OTHER)
Admission: RE | Admit: 2013-11-21 | Discharge: 2013-11-21 | Disposition: A | Discharge status: Home / Self Care | Payer: BC Managed Care – PPO | Source: Ambulatory Visit | Attending: Internal Medicine | Admitting: Internal Medicine

## 2013-11-21 DIAGNOSIS — R918 Other nonspecific abnormal finding of lung field: Secondary | ICD-10-CM

## 2013-11-21 DIAGNOSIS — R9389 Abnormal findings on diagnostic imaging of other specified body structures: Secondary | ICD-10-CM

## 2013-11-21 IMAGING — CT CT CHEST W/O CM
2 of 3 series · 15 of 36 positions shown, 18 images · IV contrast (Omnipaque 300)
Comparison: none

[Series 2: chest routine with · axial · 0.75mm/px · z∈[-282,-26]mm · 12 of 61 slices shown, 15 images]
[im 5/61  mediastinal]
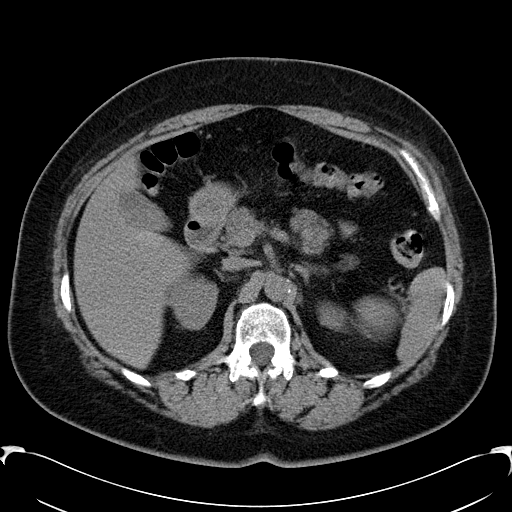
[im 5/61  lung]
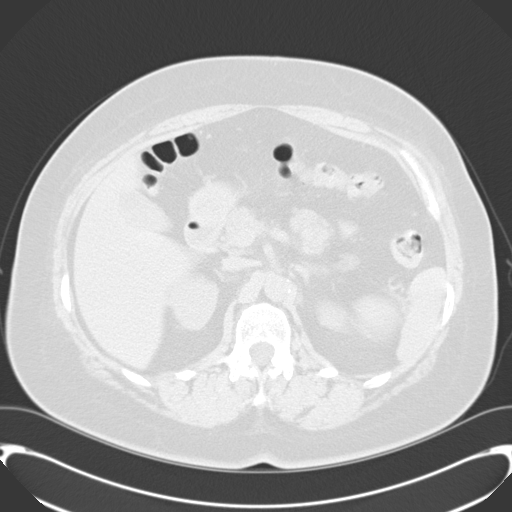
[im 9/61  lung]
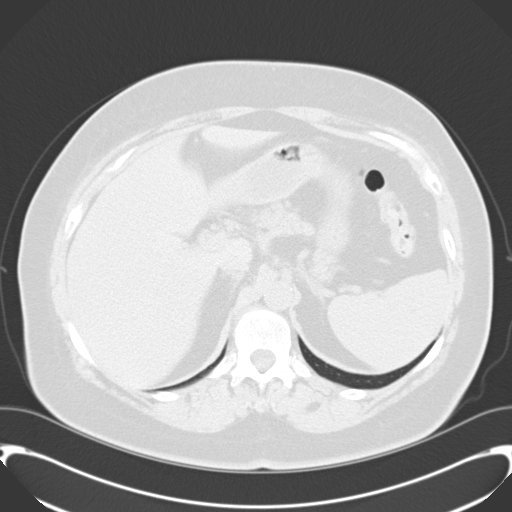
[im 14/61  lung]
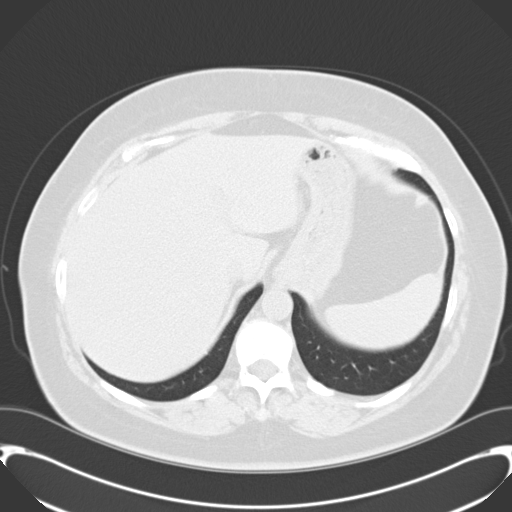
[im 18/61  lung]
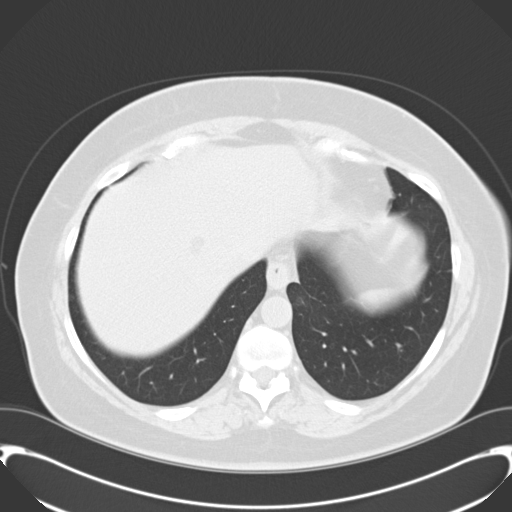
[im 23/61  mediastinal]
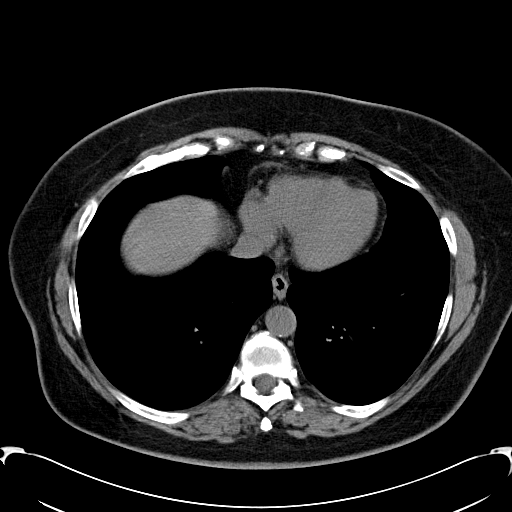
[im 23/61  lung]
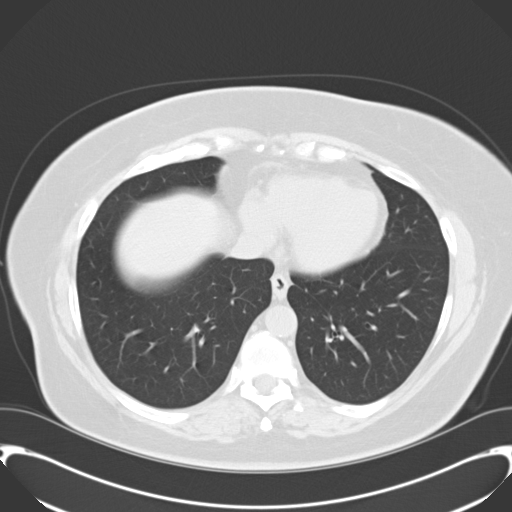
[im 27/61  lung]
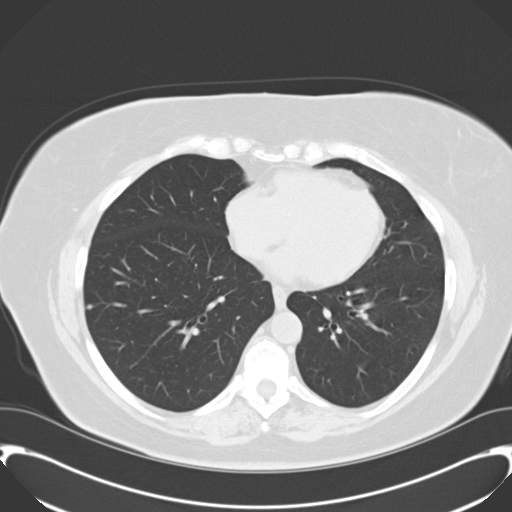
[im 34/61  lung]
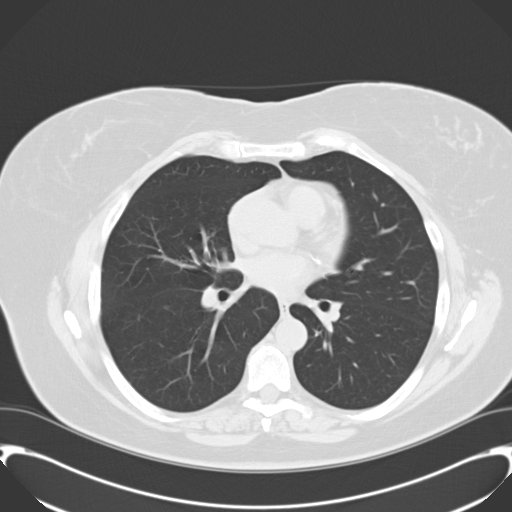
[im 38/61  lung]
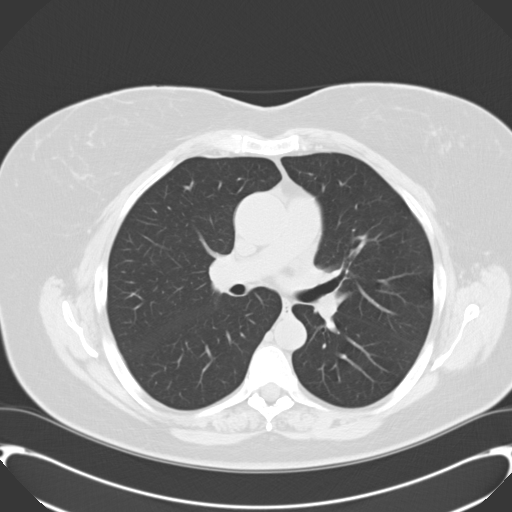
[im 43/61  mediastinal]
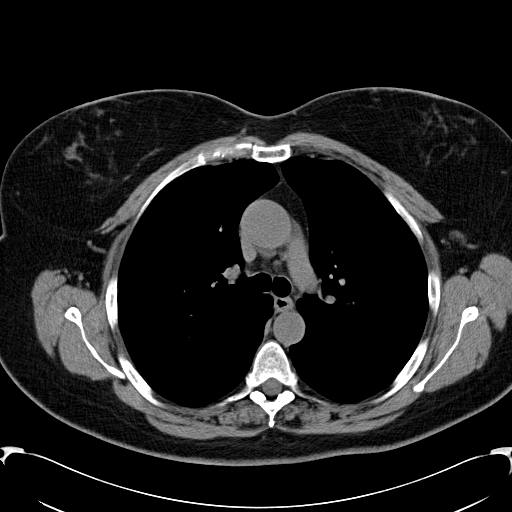
[im 43/61  lung]
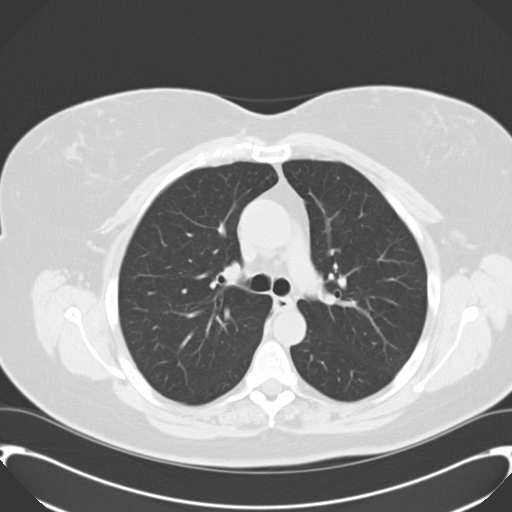
[im 47/61  lung]
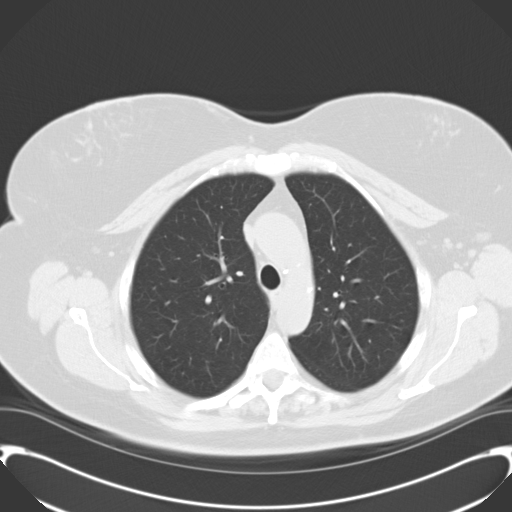
[im 52/61  lung]
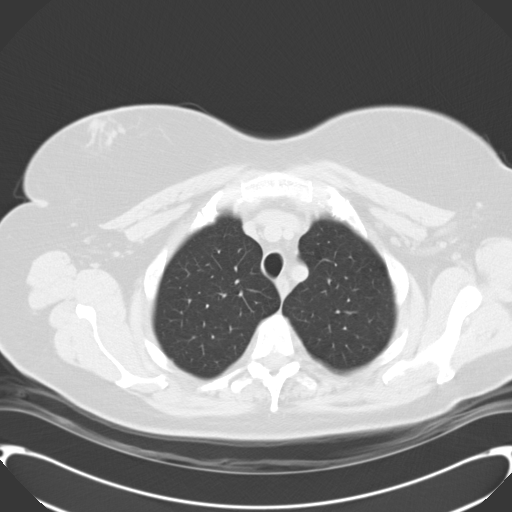
[im 56/61  lung]
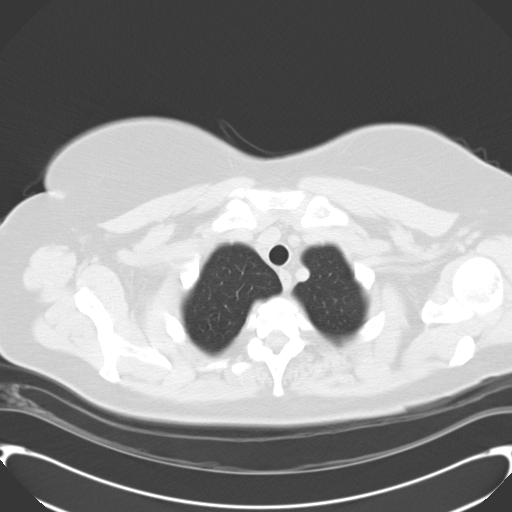

[Series 602: cor · coronal · 0.75mm/px · 3 of 110 slices shown]
[im 22/110  lung]
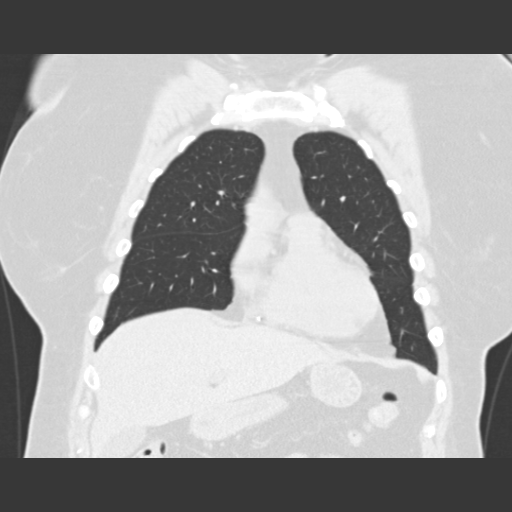
[im 44/110  lung]
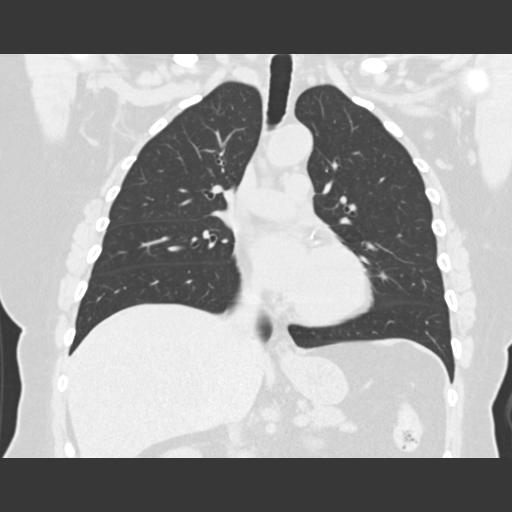
[im 66/110  lung]
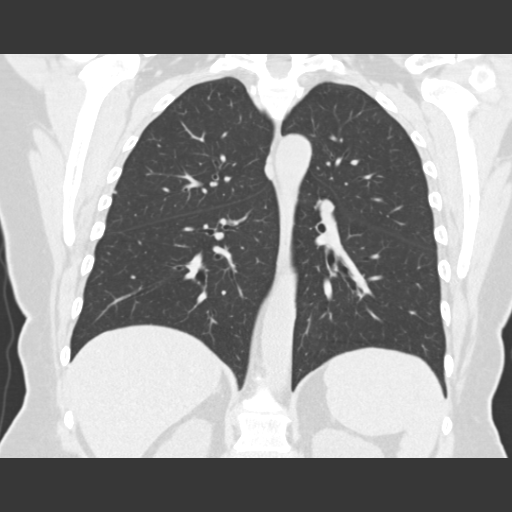

[15 of 36 positions shown; findings below may reference images not displayed]

CLINICAL DATA
Abnormal chest radiograph.  Cough.

EXAM
CT CHEST WITHOUT CONTRAST

TECHNIQUE
Multidetector CT imaging of the chest was performed following the
standard protocol without IV contrast.

COMPARISON
DG CHEST 2 VIEW dated [DATE]

FINDINGS
No pathologically enlarged mediastinal or axillary lymph nodes.
Hilar regions are difficult to definitively evaluate in the absence
of IV contrast but appear grossly unremarkable. Three-vessel
coronary artery calcification. Heart size normal. No pericardial
effusion.

Scattered pulmonary nodular densities measure up to 4 mm in the
right lower lobe. Lungs are otherwise clear. No pleural fluid.
Airway is unremarkable.

Incidental imaging of the upper abdomen shows low attenuation
lesions in the liver measuring up to 1.5 cm. In the absence of known
malignancy, cysts are likely. Visualized portion of the liver,
gallbladder, adrenal glands and right kidney are otherwise
unremarkable. 11 mm exophytic lesion off the upper pole left kidney
is incompletely imaged. Visualized portions of the spleen, pancreas,
stomach and bowel are grossly unremarkable. No upper abdominal
adenopathy. No worrisome lytic or sclerotic lesions.

IMPRESSION
1. No acute findings in the chest. No findings to explain the
questioned abnormality on recent chest radiograph.
2. Scattered pulmonary nodular densities measure 4 mm or less in
size. If the patient is at high risk for bronchogenic carcinoma,
follow-up chest CT at 1 year is recommended. If the patient is at
low risk, no follow-up is needed. This recommendation follows the
consensus statement: Guidelines for Management of Small Pulmonary
Nodules Detected on CT Scans: A Statement from the [facility]
3. Three-vessel coronary artery calcification.

SIGNATURE

## 2013-11-22 ENCOUNTER — Encounter: Payer: Self-pay | Admitting: Gastroenterology

## 2013-11-30 ENCOUNTER — Telehealth: Payer: Self-pay | Admitting: Gynecology

## 2013-11-30 NOTE — Telephone Encounter (Signed)
Spoke with patient. She states that "for several months I have been having itching on my panty line when I wear a certain pair of panties". Patient states that has been itching and redness between her legs as well as she is allergic to dogs and allows her dogs to sleep in between her legs at night. She states that her skin in between her buttocks is red and itching and feels warm to touch. I advised patient that since she is having skin redness and warmth that she will need to be seen at a local urgent care for evaluation at this time.  Patient states "well, I definitely do not want a man looking down there!" I advised that there are women providers as well that she could request. Patient states she will wait until Monday for evaluation. I advised patient that she could try otc hydrocortisone ointment and otc antifungal medication such as Clotrimazole. Patient states she will try those treatments and requests Monday morning appointment. Requests Monday morning only as she has work committments Monday afternoon and all day Tuesday. Patient scheduled for office visit with Regina Eck CNM for Monday at 0915. Patient agreeable.  Routing to Dr. Sabra Heck as covering.  Cc Dr. Charlies Constable.  Cc Regina Eck CNM

## 2013-11-30 NOTE — Telephone Encounter (Signed)
Patient calling wanting to be seen for a "painful rash" on her "bottom."

## 2013-12-03 ENCOUNTER — Ambulatory Visit (INDEPENDENT_AMBULATORY_CARE_PROVIDER_SITE_OTHER): Payer: BC Managed Care – PPO | Admitting: Certified Nurse Midwife

## 2013-12-03 ENCOUNTER — Encounter: Payer: Self-pay | Admitting: Certified Nurse Midwife

## 2013-12-03 ENCOUNTER — Telehealth: Payer: Self-pay

## 2013-12-03 VITALS — BP 110/72 | HR 68 | Resp 16 | Ht 65.5 in | Wt 209.0 lb

## 2013-12-03 DIAGNOSIS — B373 Candidiasis of vulva and vagina: Secondary | ICD-10-CM

## 2013-12-03 DIAGNOSIS — B3731 Acute candidiasis of vulva and vagina: Secondary | ICD-10-CM

## 2013-12-03 MED ORDER — TERCONAZOLE 0.4 % VA CREA: 1.0000 | TOPICAL_CREAM | Freq: Every day | VAGINAL | Status: DC

## 2013-12-03 MED ORDER — NYSTATIN 100000 UNIT/GM EX CREA: 1.0000 "application " | TOPICAL_CREAM | Freq: Two times a day (BID) | CUTANEOUS | Status: DC

## 2013-12-03 NOTE — Telephone Encounter (Signed)
Patient called stating that recently her medications were all refilled to mail order. Per pt they received Rx for generic synthroid and she has to have brand name. She is requesting that generic is cancelled and a new one for [facility] name only is sent instead. Thanks

## 2013-12-03 NOTE — Progress Notes (Signed)
68 y.o.Married Caucasian female G0P0 with a 2 week(s) history of the following:local irritation and vulvar itching Sexually active: yes  Pt also reports the following associated symptoms: increase pink scaly area down to rectum from vagina opening.  Patient has tried over the counter treatment of clotrimazole with minimal relief.Itching is better now, but still has area leading to rectum and some vaginal discharge. Denies vaginal bleeding or HRT use. No pain with sexual activity. Patient does not work out in gym, but does perspire in area. No new personal products that she is aware of.  O:Healthy female WDWN Affect:normal, orientation x 3  Inguinal lymph nodes: not enlarge or tender   Exam:  CVE:[id number], Urethra, Skene's normal, increase pink noted on vulva area, no lesions, non tender                Vag:no lesions, discharge: white, thick and odorless, pH 4.0, wet prep done                CH:ENIDPO                 Uterus:absent                Adnexa: no fullness, adnexa absent bilateral Wet Prep shows:yeast vaginal,vulva   A: Menopausal no HRT Yeast vaginitis Yeast vulvitis  P: Reviewed findings with patient. Discussed comfort measures with aveeno sitz bath or wash. Rx Terazol 7 vaginal cream see orders with instructions Rx Nystatin ointment see orders with instructions  Rv prn

## 2013-12-03 NOTE — Patient Instructions (Addendum)
Candida Infection, Adult A candida infection (also called yeast, fungus and Monilia infection) is an overgrowth of yeast that can occur anywhere on the body. A yeast infection commonly occurs in warm, moist body areas. Usually, the infection remains localized but can spread to become a systemic infection. A yeast infection may be a sign of a more severe disease such as diabetes, leukemia, or AIDS. A yeast infection can occur in both men and women. In women, Candida vaginitis is a vaginal infection. It is one of the most common causes of vaginitis. Men usually do not have symptoms or know they have an infection until other problems develop. Men may find out they have a yeast infection because their sex partner has a yeast infection. Uncircumcised men are more likely to get a yeast infection than circumcised men. This is because the uncircumcised glans is not exposed to air and does not remain as dry as that of a circumcised glans. Older adults may develop yeast infections around dentures. CAUSES  Women  Antibiotics.  Steroid medication taken for a Colina time.  Being overweight (obese).  Diabetes.  Poor immune condition.  Certain serious medical conditions.  Immune suppressive medications for organ transplant patients.  Chemotherapy.  Pregnancy.  Menstration.  Stress and fatigue.  Intravenous drug use.  Oral contraceptives.  Wearing tight-fitting clothes in the crotch area.  Catching it from a sex partner who has a yeast infection.  Spermicide.  Intravenous, urinary, or other catheters. Men  Catching it from a sex partner who has a yeast infection.  Having oral or anal sex with a person who has the infection.  Spermicide.  Diabetes.  Antibiotics.  Poor immune system.  Medications that suppress the immune system.  Intravenous drug use.  Intravenous, urinary, or other catheters. SYMPTOMS  Women  Thick, white vaginal discharge.  Vaginal itching.  Redness and  swelling in and around the vagina.  Irritation of the lips of the vagina and perineum.  Blisters on the vaginal lips and perineum.  Painful sexual intercourse.  Low blood sugar (hypoglycemia).  Painful urination.  Bladder infections.  Intestinal problems such as constipation, indigestion, bad breath, bloating, increase in gas, diarrhea, or loose stools. Men  Men may develop intestinal problems such as constipation, indigestion, bad breath, bloating, increase in gas, diarrhea, or loose stools.  Dry, cracked skin on the penis with itching or discomfort.  Jock itch.  Dry, flaky skin.  Athlete's foot.  Hypoglycemia. DIAGNOSIS  Women  A history and an exam are performed.  The discharge may be examined under a microscope.  A culture may be taken of the discharge. Men  A history and an exam are performed.  Any discharge from the penis or areas of cracked skin will be looked at under the microscope and cultured.  Stool samples may be cultured. TREATMENT  Women  Vaginal antifungal suppositories and creams.  Medicated creams to decrease irritation and itching on the outside of the vagina.  Warm compresses to the perineal area to decrease swelling and discomfort.  Oral antifungal medications.  Medicated vaginal suppositories or cream for repeated or recurrent infections.  Wash and dry the irritation areas before applying the cream.  Eating yogurt with lactobacillus may help with prevention and treatment.  Sometimes painting the vagina with gentian violet solution may help if creams and suppositories do not work. Men  Antifungal creams and oral antifungal medications.  Sometimes treatment must continue for 30 days after the symptoms go away to prevent recurrence. HOME CARE  INSTRUCTIONS  Women  Use cotton underwear and avoid tight-fitting clothing.  Avoid colored, scented toilet paper and deodorant tampons or pads.  Do not douche.  Keep your diabetes  under control.  Finish all the prescribed medications.  Keep your skin clean and dry.  Consume milk or yogurt with lactobacillus active culture regularly. If you get frequent yeast infections and think that is what the infection is, there are over-the-counter medications that you can get. If the infection does not show healing in 3 days, talk to your caregiver.  Tell your sex partner you have a yeast infection. Your partner may need treatment also, especially if your infection does not clear up or recurs. Men  Keep your skin clean and dry.  Keep your diabetes under control.  Finish all prescribed medications.  Tell your sex partner that you have a yeast infection so they can be treated if necessary. SEEK MEDICAL CARE IF:   Your symptoms do not clear up or worsen in one week after treatment.  You have an oral temperature above 102 F (38.9 C).  You have trouble swallowing or eating for a prolonged time.  You develop blisters on and around your vagina.  You develop vaginal bleeding and it is not your menstrual period.  You develop abdominal pain.  You develop intestinal problems as mentioned above.  You get weak or lightheaded.  You have painful or increased urination.  You have pain during sexual intercourse. MAKE SURE YOU:   Understand these instructions.  Will watch your condition.  Will get help right away if you are not doing well or get worse. Document Released: 10/07/2004 Document Revised: 11/22/2011 Document Reviewed: 01/19/2010 [facility] Patient Information 2014 [place].  Aveeno sitz bath for balance ph.

## 2013-12-03 NOTE — Progress Notes (Signed)
Reviewed personally.  M. Suzanne Gaynell Eggleton, MD.  

## 2013-12-04 ENCOUNTER — Encounter: Payer: Self-pay | Admitting: Internal Medicine

## 2013-12-04 MED ORDER — LEVOTHYROXINE SODIUM 137 MCG PO TABS: 137.0000 ug | ORAL_TABLET | Freq: Every day | ORAL | Status: DC

## 2013-12-04 NOTE — Addendum Note (Signed)
Addended by: Biagio Borg on: 12/04/2013 07:08 PM   Modules accepted: Orders

## 2013-12-04 NOTE — Telephone Encounter (Signed)
Called [facility].  They confirmedBRAND name has been processed and was mailed out on 11/29/13. Called the patient to inform as well.

## 2013-12-17 ENCOUNTER — Encounter: Payer: Self-pay | Admitting: Obstetrics and Gynecology

## 2013-12-17 ENCOUNTER — Ambulatory Visit (INDEPENDENT_AMBULATORY_CARE_PROVIDER_SITE_OTHER): Payer: BC Managed Care – PPO | Admitting: Obstetrics and Gynecology

## 2013-12-17 VITALS — BP 142/80 | HR 70 | Ht 65.5 in | Wt 207.4 lb

## 2013-12-17 DIAGNOSIS — L309 Dermatitis, unspecified: Secondary | ICD-10-CM

## 2013-12-17 DIAGNOSIS — L259 Unspecified contact dermatitis, unspecified cause: Secondary | ICD-10-CM

## 2013-12-17 MED ORDER — TRIAMCINOLONE ACETONIDE 0.5 % EX OINT: 1.0000 "application " | TOPICAL_OINTMENT | Freq: Two times a day (BID) | CUTANEOUS | Status: DC

## 2013-12-17 NOTE — Patient Instructions (Signed)
Use the Triamcinolone ointment twice a day for a week.  Change your toilet tissue!

## 2013-12-17 NOTE — Progress Notes (Signed)
Patient ID: Mary Ward, female   DOB: 03/09/1946, 68 y.o.   MRN: [id number] GYNECOLOGY VISIT  PCP: Cathlean Cower, MD  Referring provider:   HPI: 68 y.o.   Married  Caucasian  female   G0P0 with Patient's last menstrual period was 09/13/1994.   here for   Rectal/vaginal irritation. Redness around the rectum is what started her clinical presentation 6 months ago. Itching along the elastic panty line. Cortisone helped the itching.  Has treated with Terazol and Nystatin, and symptoms are improved but not gone.   No itching currently.  Notes redness between vagina and rectum.  Has colonoscopy appointment in May.   States has to use perfume free antipersperant.   GYNECOLOGIC HISTORY: Patient's last menstrual period was 09/13/1994. Sexually active:  yes Partner preference: female Contraception: hysterectomy   Menopausal hormone therapy: no DES exposure: no   Blood transfusions:   no Sexually transmitted diseases:  no  GYN procedures and prior surgeries:  LAVH/BSO 1992 Last mammogram: 09-21-13 UXN:ATFTD                Last pap and high risk HPV testing:   07-03-03 wnl History of abnormal pap smear: no     OB History   Grav Para Term Preterm Abortions TAB SAB Ect Mult Living   0                LIFESTYLE: Exercise:    no           Tobacco:     no Alcohol:       1 glass of wine per week Drug use:    no  Patient Active Problem List   Diagnosis Date Noted  . Impaired glucose tolerance 11/15/2012  . Preventative health care 11/12/2011  . MENOPAUSAL DISORDER 09/29/2009  . ELECTROCARDIOGRAM, ABNORMAL 08/07/2008  . GERD 08/06/2007  . COLONIC POLYPS, HX OF 08/06/2007  . HYPOTHYROIDISM 05/07/2007  . HYPERLIPIDEMIA 05/07/2007  . OBESITY 05/07/2007  . ANXIETY 05/07/2007  . HYPERTENSION 05/07/2007  . Erythema nodosum 04/28/2007    Past Medical History  Diagnosis Date  . Hyperlipidemia   . Obesity   . Anxiety   . Hypertension   . Hypothyroidism   . GERD (gastroesophageal  reflux disease)   . EN (erythema nodosum)   . Hyperplastic colonic polyp     Past Surgical History  Procedure Laterality Date  . Oophorectomy    . Tonsillectomy    . Breast reduction surgery  2002  . Hammer toe surgery      right 2nd toe  . Laparoscopic assisted vaginal hysterectomy  1992    with BSO  . Abdominal hysterectomy    . Cyst excision  12/15/12    punch biopsy left shoulder blade    Current Outpatient Prescriptions  Medication Sig Dispense Refill  . aspirin 81 MG tablet Take 81 mg by mouth daily.        Marland Kitchen atorvastatin (LIPITOR) 20 MG tablet Take 1 tablet (20 mg total) by mouth daily.  90 tablet  3  . diphenhydrAMINE (BENADRYL) 12.5 MG/5ML elixir Take by mouth as needed for allergies.      Marland Kitchen levothyroxine (SYNTHROID, LEVOTHROID) 137 MCG tablet Take 1 tablet (137 mcg total) by mouth daily. Brand Name only  90 tablet  3  . nystatin cream (MYCOSTATIN) Apply 1 application topically 2 (two) times daily. Apply to affected area BID for 5 days  60 g  0  . telmisartan-hydrochlorothiazide (MICARDIS HCT) 40-12.5 MG per tablet  Take 1 tablet by mouth daily.  90 tablet  3   No current facility-administered medications for this visit.     ALLERGIES: Indomethacin; Prednisone; Tetanus toxoid; and Tetracycline Reaction to steriods is  Family History  Problem Relation Age of Onset  . Hyperlipidemia Mother   . Stroke Mother   . Hypertension Mother   . Heart attack Mother   . Cancer Father     Lung  . Diabetes Sister   . Heart disease Sister     CAD  . Hypertension Sister     History   Social History  . Marital Status: Married    Spouse Name: N/A    Number of Children: 0  . Years of Education: N/A   Occupational History  . Surveyor, mining   Social History Main Topics  . Smoking status: Never Smoker   . Smokeless tobacco: Not on file  . Alcohol Use: 0.5 oz/week    1 drink(s) per week     Comment: occ glass of wine  . Drug Use: No  . Sexual  Activity: No     Comment: Hysterectomy   Other Topics Concern  . Not on file   Social History Narrative   Family history of high Cholesterol    ROS:  Pertinent items are noted in HPI.  PHYSICAL EXAMINATION:    BP 142/80  Pulse 70  Ht 5' 5.5" (1.664 m)  Wt 207 lb 6.4 oz (94.076 kg)  BMI 33.98 kg/m2  LMP 09/13/1994   Wt Readings from Last 3 Encounters:  12/17/13 207 lb 6.4 oz (94.076 kg)  12/03/13 209 lb (94.802 kg)  11/16/13 207 lb 4 oz (94.008 kg)     Ht Readings from Last 3 Encounters:  12/17/13 5' 5.5" (1.664 m)  12/03/13 5' 5.5" (1.664 m)  11/16/13 5\' 5"  (1.651 m)    General appearance: alert, cooperative and appears stated age Neurologic: Grossly normal  Pelvic: External genitalia:  no lesions              Urethra:  normal appearing urethra with no masses, tenderness or lesions              Bartholins and Skenes: normal                 Vagina: normal appearing vagina with normal color and discharge, no lesions              Cervix: normal appearance                 Bimanual Exam:  Uterus:  uterus is normal size, shape, consistency and nontender                                      Adnexa: normal adnexa in size, nontender and no masses                                                                        Anus:  erythemous ring around anus.  No lesions.   ASSESSMENT  Perianal dermatitis.  Possible atopic reaction.   PLAN  Trimacinolone ointment 0.5% to area bid  for one week.  Switch toilet tissue to dye free and perfume free.   Try aloe wipes on bottom.  Return prn.   15 minutes face to face time of which over 50% was spent in counseling.    An After Visit Summary was printed and given to the patient.

## 2014-01-08 ENCOUNTER — Ambulatory Visit (AMBULATORY_SURGERY_CENTER): Payer: Self-pay | Admitting: *Deleted

## 2014-01-08 VITALS — Ht 65.0 in | Wt 209.8 lb

## 2014-01-08 DIAGNOSIS — Z1211 Encounter for screening for malignant neoplasm of colon: Secondary | ICD-10-CM

## 2014-01-08 MED ORDER — MOVIPREP 100 G PO SOLR
ORAL | Status: DC
Start: 2014-01-08 — End: 2014-01-22

## 2014-01-08 NOTE — Progress Notes (Signed)
No egg or soy allergy  No home oxygen use or problems with anesthesia  No medications for weight loss taken  emmi information given  Pt states her Micardis HCT does not maker her go to the bathroom excessively, so wishes to take it the day of the procedure as she normally does  She states she has had a "perianal rash" and was told to discuss this with me at her PV.  Pt states the prescribed creams didn't work but has recently been using diaper rash ointment and that has helped.  I told her that she would have an opportunity to discuss this with Dr. Fuller Plan prior to her procedure and understanding voiced

## 2014-01-09 ENCOUNTER — Encounter: Payer: Self-pay | Admitting: Gastroenterology

## 2014-01-11 ENCOUNTER — Ambulatory Visit: Payer: BC Managed Care – PPO | Admitting: Obstetrics and Gynecology

## 2014-01-11 ENCOUNTER — Encounter: Payer: Self-pay | Admitting: Gynecology

## 2014-01-11 ENCOUNTER — Ambulatory Visit (INDEPENDENT_AMBULATORY_CARE_PROVIDER_SITE_OTHER): Payer: BC Managed Care – PPO | Admitting: Gynecology

## 2014-01-11 VITALS — BP 132/80 | HR 68 | Resp 20 | Ht 65.5 in | Wt 208.0 lb

## 2014-01-11 DIAGNOSIS — Z01419 Encounter for gynecological examination (general) (routine) without abnormal findings: Secondary | ICD-10-CM

## 2014-01-11 DIAGNOSIS — Z0000 Encounter for general adult medical examination without abnormal findings: Secondary | ICD-10-CM

## 2014-01-11 LAB — POCT URINALYSIS DIPSTICK
BILIRUBIN UA: NEGATIVE
GLUCOSE UA: NEGATIVE
Ketones, UA: NEGATIVE
Leukocytes, UA: NEGATIVE
Nitrite, UA: NEGATIVE
PH UA: 7
Protein, UA: NEGATIVE
RBC UA: NEGATIVE
Urobilinogen, UA: NEGATIVE

## 2014-01-11 NOTE — Patient Instructions (Signed)

## 2014-01-11 NOTE — Progress Notes (Signed)
68 y.o. Married Caucasian female   G0P0 here for annual exam.  She does not report hot flashes, does not have night sweats, does not know have vaginal dryness. She does not report post-menopasual bleeding.  Pt was seen recently for perianal dermatitis, treated with triamcinolone which burned she used instead diaper rash cream.  Patient's last menstrual period was 09/13/1994.          Sexually active: no  The current method of family planning is status post hysterectomy.    Exercising: no  The patient does not participate in regular exercise at present. Last pap: 2004  Abnormal PAP: No Mammogram: 09/21/2013 BI-RADS1: Neg BSE: No Colonoscopy: 2004. next 01/22/2014 DEXA: 2014 Alcohol: Yes Tobacco: No Labs: PCP UA: Clear  Health Maintenance  Topic Date Due  . Tetanus/tdap  09/13/2004  . Colonoscopy  08/20/2013  . Influenza Vaccine  04/13/2014  . Mammogram  10/11/2015  . Pneumococcal Polysaccharide Vaccine Age 64 And Over  Completed  . Zostavax  Completed    Family History  Problem Relation Age of Onset  . Hyperlipidemia Mother   . Stroke Mother   . Hypertension Mother   . Heart attack Mother   . Cancer Father     Lung  . Diabetes Sister   . Heart disease Sister     CAD  . Hypertension Sister   . Colon cancer Neg Hx   . Esophageal cancer Neg Hx   . Rectal cancer Neg Hx   . Stomach cancer Neg Hx     Patient Active Problem List   Diagnosis Date Noted  . Impaired glucose tolerance 11/15/2012  . Preventative health care 11/12/2011  . MENOPAUSAL DISORDER 09/29/2009  . ELECTROCARDIOGRAM, ABNORMAL 08/07/2008  . GERD 08/06/2007  . COLONIC POLYPS, HX OF 08/06/2007  . HYPOTHYROIDISM 05/07/2007  . HYPERLIPIDEMIA 05/07/2007  . OBESITY 05/07/2007  . ANXIETY 05/07/2007  . HYPERTENSION 05/07/2007  . Erythema nodosum 04/28/2007    Past Medical History  Diagnosis Date  . Hyperlipidemia   . Obesity   . Hypertension   . Hypothyroidism   . GERD (gastroesophageal reflux  disease)   . EN (erythema nodosum)   . Hyperplastic colonic polyp   . Allergy   . Anxiety     no per pt  . Cataract     per pt "small one starting", no need for treatment at this time    Past Surgical History  Procedure Laterality Date  . Oophorectomy    . Tonsillectomy    . Breast reduction surgery  2002  . Hammer toe surgery      right 2nd toe  . Laparoscopic assisted vaginal hysterectomy  1992    with BSO  . Cyst excision  12/15/12    punch biopsy left shoulder blade  . Colonoscopy    . Bunionectomy      both feet    Allergies: Indomethacin; Prednisone; Prevnar 13; Tetanus toxoid; and Tetracycline  Current Outpatient Prescriptions  Medication Sig Dispense Refill  . aspirin 81 MG tablet Take 81 mg by mouth daily.        Marland Kitchen atorvastatin (LIPITOR) 20 MG tablet Take 1 tablet (20 mg total) by mouth daily.  90 tablet  3  . diphenhydrAMINE (BENADRYL) 12.5 MG/5ML elixir Take by mouth as needed for allergies.      Marland Kitchen levothyroxine (SYNTHROID, LEVOTHROID) 137 MCG tablet Take 1 tablet (137 mcg total) by mouth daily. Brand Name only  90 tablet  3  . telmisartan-hydrochlorothiazide (MICARDIS HCT) 40-12.5  MG per tablet Take 1 tablet by mouth daily.  90 tablet  3  . MOVIPREP 100 G SOLR Moviprep as directed, no substitutions  1 kit  0   No current facility-administered medications for this visit.    ROS: Pertinent items are noted in HPI.  Exam:    BP 132/80  Pulse 68  Resp 20  Ht 5' 5.5" (1.664 m)  Wt 208 lb (94.348 kg)  BMI 34.07 kg/m2  LMP 09/13/1994 Weight change: @WEIGHTCHANGE @ Last 3 height recordings:  Ht Readings from Last 3 Encounters:  01/11/14 5' 5.5" (1.664 m)  01/08/14 5' 5"  (1.651 m)  12/17/13 5' 5.5" (1.664 m)   General appearance: alert, cooperative and appears stated age Head: Normocephalic, without obvious abnormality, atraumatic Neck: no adenopathy, no carotid bruit, no JVD, supple, symmetrical, trachea midline and thyroid not enlarged, symmetric, no  tenderness/mass/nodules Lungs: clear to auscultation bilaterally Breasts: normal appearance, no masses or tenderness Heart: regular rate and rhythm, S1, S2 normal, no murmur, click, rub or gallop Abdomen: soft, non-tender; bowel sounds normal; no masses,  no organomegaly Extremities: extremities normal, atraumatic, no cyanosis or edema Skin: Skin color, texture, turgor normal. No rashes or lesions Lymph nodes: Cervical, supraclavicular, and axillary nodes normal. no inguinal nodes palpated Neurologic: Grossly normal   Pelvic: External genitalia:  no lesions              Urethra: normal appearing urethra with no masses, tenderness or lesions              Bartholins and Skenes: normal                 Vagina: atrophic              Cervix: absent              Pap taken: no        Bimanual Exam:  Uterus:  absent                                      Adnexa:    no masses                                      Rectovaginal: Confirms                                      Anus:  normal sphincter tone, no lesions,rash resolved  A: well woman      P: mammogram  Perineal care reviewed counseled on breast self exam, mammography screening, adequate intake of calcium and vitamin D, diet and exercise return annually or prn Discussed PAP guideline changes, importance of weight bearing exercises, calcium, vit D and balanced diet.  An After Visit Summary was printed and given to the patient.

## 2014-01-18 ENCOUNTER — Encounter: Payer: BC Managed Care – PPO | Admitting: Gastroenterology

## 2014-01-22 ENCOUNTER — Ambulatory Visit (AMBULATORY_SURGERY_CENTER): Payer: BC Managed Care – PPO | Admitting: Gastroenterology

## 2014-01-22 ENCOUNTER — Encounter: Payer: Self-pay | Admitting: Gastroenterology

## 2014-01-22 VITALS — BP 131/89 | HR 65 | Temp 96.2°F | Resp 18 | Ht 65.0 in | Wt 209.0 lb

## 2014-01-22 DIAGNOSIS — D126 Benign neoplasm of colon, unspecified: Secondary | ICD-10-CM

## 2014-01-22 DIAGNOSIS — Z1211 Encounter for screening for malignant neoplasm of colon: Secondary | ICD-10-CM

## 2014-01-22 HISTORY — PX: COLONOSCOPY: SHX174

## 2014-01-22 MED ORDER — SODIUM CHLORIDE 0.9 % IV SOLN: 500.0000 mL | INTRAVENOUS | Status: DC

## 2014-01-22 NOTE — Op Note (Signed)
[place]  [address]. [place], [id number]   COLONOSCOPY PROCEDURE REPORT  PATIENT: Mary Ward, Mary Ward  MR#: [id number] BIRTHDATE: 1946/09/01 , 58  yrs. old GENDER: Female ENDOSCOPIST: Ladene Artist, MD, [facility] PROCEDURE DATE:  01/22/2014 PROCEDURE:   Colonoscopy with snare polypectomy First Screening Colonoscopy - Avg.  risk and is 50 yrs.  old or older Yes.  Prior Negative Screening - Now for repeat screening. N/A  History of Adenoma - Now for follow-up colonoscopy & has been > or = to 3 yrs.  N/A  Polyps Removed Today? Yes. ASA CLASS:   Class II INDICATIONS:average risk screening. MEDICATIONS: MAC sedation, administered by CRNA and propofol (Diprivan) 200mg  IV DESCRIPTION OF PROCEDURE:   After the risks benefits and alternatives of the procedure were thoroughly explained, informed consent was obtained.  A digital rectal exam revealed no abnormalities of the rectum.   The LB [id number]  endoscope was introduced through the anus and advanced to the cecum, which was identified by both the appendix and ileocecal valve. No adverse events experienced.   The quality of the prep was good, using MoviPrep  The instrument was then slowly withdrawn as the colon was fully examined.  COLON FINDINGS: A sessile polyp measuring 7 mm in size was found in the ascending colon.  A polypectomy was performed with a cold snare.  The resection was complete and the polyp tissue was completely retrieved.   A sessile polyp measuring 5 mm in size was found in the transverse colon.  A polypectomy was performed with a cold snare.  The resection was complete and the polyp tissue was completely retrieved.   The colon was otherwise normal.  There was no diverticulosis, inflammation, polyps or cancers unless previously stated.   Mild diverticulosis was noted in the sigmoid colon.  Retroflexed views revealed internal hemorrhoids. The time to cecum=1 minutes 25 seconds.  Withdrawal time=7  minutes 56 seconds.  The scope was withdrawn and the procedure completed. COMPLICATIONS: There were no complications.  ENDOSCOPIC IMPRESSION: 1.   Sessile polyp measuring 7 mm in the ascending colon; polypectomy performed with a cold snare 2.   Sessile polyp measuring 5 mm in the transverse colon; polypectomy performed with a cold snare 3.   Mild diverticulosis was noted in the sigmoid colon 4.   Small internal hemorrhoids  RECOMMENDATIONS: 1.  Await pathology results 2.  Repeat colonoscopy in 5 years if polyp(s) adenomatous; otherwise 10 years 3.  High fiber diet with liberal fluid intake.  eSigned:  Ladene Artist, MD, [facility] 01/22/2014 9:00 AM

## 2014-01-22 NOTE — Progress Notes (Signed)
Called to room to assist during endoscopic procedure.  Patient ID and intended procedure confirmed with present staff. Received instructions for my participation in the procedure from the performing physician.  

## 2014-01-22 NOTE — Patient Instructions (Signed)

## 2014-01-22 NOTE — Progress Notes (Signed)
A/ox3 pleased with MAC, report to Kristen RN 

## 2014-01-23 ENCOUNTER — Telehealth: Payer: Self-pay | Admitting: *Deleted

## 2014-01-23 NOTE — Telephone Encounter (Signed)
  Follow up Call-  Call back number 01/22/2014  Post procedure Call Back phone  # [phone]  Permission to leave phone message Yes     Patient questions:  Do you have a fever, pain , or abdominal swelling? no Pain Score  0 *  Have you tolerated food without any problems? yes  Have you been able to return to your normal activities? yes  Do you have any questions about your discharge instructions: Diet   no Medications  no Follow up visit  no  Do you have questions or concerns about your Care? no  Actions: * If pain score is 4 or above: No action needed, pain <4.

## 2014-01-28 ENCOUNTER — Encounter: Payer: Self-pay | Admitting: Gastroenterology

## 2014-10-01 LAB — HM MAMMOGRAPHY

## 2014-10-02 ENCOUNTER — Other Ambulatory Visit: Payer: Self-pay | Admitting: Obstetrics and Gynecology

## 2014-12-12 ENCOUNTER — Other Ambulatory Visit (INDEPENDENT_AMBULATORY_CARE_PROVIDER_SITE_OTHER): Payer: 59

## 2014-12-12 DIAGNOSIS — Z0000 Encounter for general adult medical examination without abnormal findings: Secondary | ICD-10-CM

## 2014-12-12 LAB — URINALYSIS, ROUTINE W REFLEX MICROSCOPIC
Bilirubin Urine: NEGATIVE
Ketones, ur: NEGATIVE
Leukocytes, UA: NEGATIVE
Nitrite: NEGATIVE
SPECIFIC GRAVITY, URINE: 1.025 (ref 1.000–1.030)
TOTAL PROTEIN, URINE-UPE24: NEGATIVE
Urine Glucose: NEGATIVE
Urobilinogen, UA: 0.2 (ref 0.0–1.0)
pH: 6 (ref 5.0–8.0)

## 2014-12-12 LAB — HEPATIC FUNCTION PANEL
ALK PHOS: 98 U/L (ref 39–117)
ALT: 24 U/L (ref 0–35)
AST: 18 U/L (ref 0–37)
Albumin: 3.8 g/dL (ref 3.5–5.2)
Bilirubin, Direct: 0.1 mg/dL (ref 0.0–0.3)
TOTAL PROTEIN: 6.9 g/dL (ref 6.0–8.3)
Total Bilirubin: 0.6 mg/dL (ref 0.2–1.2)

## 2014-12-12 LAB — CBC WITH DIFFERENTIAL/PLATELET
BASOS ABS: 0 10*3/uL (ref 0.0–0.1)
Basophils Relative: 0.5 % (ref 0.0–3.0)
EOS ABS: 0.1 10*3/uL (ref 0.0–0.7)
Eosinophils Relative: 1.6 % (ref 0.0–5.0)
HEMATOCRIT: 44.1 % (ref 36.0–46.0)
Hemoglobin: 14.7 g/dL (ref 12.0–15.0)
LYMPHS ABS: 2.7 10*3/uL (ref 0.7–4.0)
Lymphocytes Relative: 31.4 % (ref 12.0–46.0)
MCHC: 33.2 g/dL (ref 30.0–36.0)
MCV: 82.7 fl (ref 78.0–100.0)
Monocytes Absolute: 0.6 10*3/uL (ref 0.1–1.0)
Monocytes Relative: 7.5 % (ref 3.0–12.0)
NEUTROS ABS: 5.1 10*3/uL (ref 1.4–7.7)
Neutrophils Relative %: 59 % (ref 43.0–77.0)
PLATELETS: 161 10*3/uL (ref 150.0–400.0)
RBC: 5.34 Mil/uL — ABNORMAL HIGH (ref 3.87–5.11)
RDW: 13 % (ref 11.5–15.5)
WBC: 8.6 10*3/uL (ref 4.0–10.5)

## 2014-12-12 LAB — LIPID PANEL
CHOL/HDL RATIO: 3
Cholesterol: 154 mg/dL (ref 0–200)
HDL: 45.4 mg/dL (ref >39.00)
LDL CALC: 93 mg/dL (ref 0–99)
NonHDL: 108.6
TRIGLYCERIDES: 78 mg/dL (ref 0.0–149.0)
VLDL: 15.6 mg/dL (ref 0.0–40.0)

## 2014-12-12 LAB — BASIC METABOLIC PANEL
BUN: 16 mg/dL (ref 6–23)
CALCIUM: 9.4 mg/dL (ref 8.4–10.5)
CO2: 28 meq/L (ref 19–32)
Chloride: 105 mEq/L (ref 96–112)
Creatinine, Ser: 0.8 mg/dL (ref 0.40–1.20)
GFR: 75.96 mL/min (ref >60.00)
GLUCOSE: 111 mg/dL — AB (ref 70–99)
POTASSIUM: 4.4 meq/L (ref 3.5–5.1)
SODIUM: 140 meq/L (ref 135–145)

## 2014-12-12 LAB — TSH: TSH: 0.76 u[IU]/mL (ref 0.35–4.50)

## 2014-12-19 ENCOUNTER — Ambulatory Visit (INDEPENDENT_AMBULATORY_CARE_PROVIDER_SITE_OTHER): Payer: 59 | Admitting: Internal Medicine

## 2014-12-19 ENCOUNTER — Encounter: Payer: Self-pay | Admitting: Internal Medicine

## 2014-12-19 VITALS — BP 122/80 | HR 76 | Temp 97.9°F | Resp 18 | Ht 65.0 in | Wt 213.1 lb

## 2014-12-19 DIAGNOSIS — Z0000 Encounter for general adult medical examination without abnormal findings: Secondary | ICD-10-CM

## 2014-12-19 DIAGNOSIS — R739 Hyperglycemia, unspecified: Secondary | ICD-10-CM | POA: Insufficient documentation

## 2014-12-19 DIAGNOSIS — I2510 Atherosclerotic heart disease of native coronary artery without angina pectoris: Secondary | ICD-10-CM | POA: Insufficient documentation

## 2014-12-19 MED ORDER — LEVOTHYROXINE SODIUM 137 MCG PO TABS: 137.0000 ug | ORAL_TABLET | Freq: Every day | ORAL | Status: DC

## 2014-12-19 MED ORDER — ATORVASTATIN CALCIUM 20 MG PO TABS: 20.0000 mg | ORAL_TABLET | Freq: Every day | ORAL | Status: DC

## 2014-12-19 MED ORDER — TELMISARTAN-HCTZ 40-12.5 MG PO TABS: 1.0000 | ORAL_TABLET | Freq: Every day | ORAL | Status: DC

## 2014-12-19 NOTE — Progress Notes (Signed)
Subjective:    Patient ID: Mary Ward, female    DOB: [birth year], 68 y.o.   MRN: [id number]  HPI  Here for wellness and f/u;  Overall doing ok;  Pt denies Chest pain, worsening SOB, DOE, wheezing, orthopnea, PND, worsening LE edema, palpitations, dizziness or syncope.  Pt denies neurological change such as new headache, facial or extremity weakness.  Pt denies polydipsia, polyuria, or low sugar symptoms. Pt states overall good compliance with treatment and medications, good tolerability, and has been trying to follow appropriate diet.  Pt denies worsening depressive symptoms, suicidal ideation or panic. No fever, night sweats, wt loss, loss of appetite, or other constitutional symptoms.  Pt states good ability with ADL's, has low fall risk, home safety reviewed and adequate, no other significant changes in hearing or vision, and only occasionally active with exercise. Plans to do more, as she is aware of the 3V coronary calcification by CT 1 yr ago Past Medical History  Diagnosis Date  . Hyperlipidemia   . Obesity   . Hypertension   . Hypothyroidism   . GERD (gastroesophageal reflux disease)   . EN (erythema nodosum)   . Hyperplastic colonic polyp   . Allergy   . Anxiety     no per pt  . Cataract     per pt "small one starting", no need for treatment at this time   Past Surgical History  Procedure Laterality Date  . Oophorectomy    . Tonsillectomy    . Breast reduction surgery  2002  . Hammer toe surgery      right 2nd toe  . Laparoscopic assisted vaginal hysterectomy  1992    with BSO  . Cyst excision  12/15/12    punch biopsy left shoulder blade  . Colonoscopy    . Bunionectomy      both feet    reports that she has never smoked. She has never used smokeless tobacco. She reports that she drinks about 0.5 oz of alcohol per week. She reports that she does not use illicit drugs. family history includes Cancer in her father; Diabetes in her sister; Heart attack in her mother;  Heart disease in her sister; Hyperlipidemia in her mother; Hypertension in her mother and sister; Stroke in her mother. There is no history of Colon cancer, Esophageal cancer, Rectal cancer, or Stomach cancer. Allergies  Allergen Reactions  . Indomethacin     Took with ASA and caused severe bloating  . Prednisone     Developed acne  . Prevnar 13 [Pneumococcal 13-Val Conj Vacc]     Arm became very sore and pt developed "fiery red knot" at site  . Tetanus Toxoid     Entire arm became swollen and turned red  . Tetracycline     "hot, red knots" below knees   Current Outpatient Prescriptions on File Prior to Visit  Medication Sig Dispense Refill  . aspirin 81 MG tablet Take 81 mg by mouth daily.      Marland Kitchen atorvastatin (LIPITOR) 20 MG tablet Take 1 tablet (20 mg total) by mouth daily. 90 tablet 3  . diphenhydrAMINE (BENADRYL) 12.5 MG/5ML elixir Take by mouth as needed for allergies.    Marland Kitchen levothyroxine (SYNTHROID, LEVOTHROID) 137 MCG tablet Take 1 tablet (137 mcg total) by mouth daily. Brand Name only 90 tablet 3  . telmisartan-hydrochlorothiazide (MICARDIS HCT) 40-12.5 MG per tablet Take 1 tablet by mouth daily. 90 tablet 3   No current facility-administered medications on file prior to  visit.      Review of Systems Constitutional: Negative for increased diaphoresis, other activity, appetite or siginficant weight change other than noted HENT: Negative for worsening hearing loss, ear pain, facial swelling, mouth sores and neck stiffness.   Eyes: Negative for other worsening pain, redness or visual disturbance.  Respiratory: Negative for shortness of breath and wheezing  Cardiovascular: Negative for chest pain and palpitations.  Gastrointestinal: Negative for diarrhea, blood in stool, abdominal distention or other pain Genitourinary: Negative for hematuria, flank pain or change in urine volume.  Musculoskeletal: Negative for myalgias or other joint complaints.  Skin: Negative for color  change and wound or drainage.  Neurological: Negative for syncope and numbness. other than noted Hematological: Negative for adenopathy. or other swelling Psychiatric/Behavioral: Negative for hallucinations, SI, self-injury, decreased concentration or other worsening agitation.      Objective:   Physical Exam BP 122/80 mmHg  Pulse 76  Temp(Src) 97.9 F (36.6 C) (Oral)  Resp 18  Ht 5\' 5"  (1.651 m)  Wt 213 lb 1.3 oz (96.652 kg)  BMI 35.46 kg/m2  SpO2 97%  LMP 09/13/1994 VS noted,  Constitutional: Pt is oriented to person, place, and time. Appears well-developed and well-nourished, in no significant distress Head: Normocephalic and atraumatic.  Right Ear: External ear normal.  Left Ear: External ear normal.  Nose: Nose normal.  Mouth/Throat: Oropharynx is clear and moist.  Eyes: Conjunctivae and EOM are normal. Pupils are equal, round, and reactive to light.  Neck: Normal range of motion. Neck supple. No JVD present. No tracheal deviation present or significant neck LA or mass Cardiovascular: Normal rate, regular rhythm, normal heart sounds and intact distal pulses.   Pulmonary/Chest: Effort normal and breath sounds without rales or wheezing  Abdominal: Soft. Bowel sounds are normal. NT. No HSM  Musculoskeletal: Normal range of motion. Exhibits no edema.  Lymphadenopathy:  Has no cervical adenopathy.  Neurological: Pt is alert and oriented to person, place, and time. Pt has normal reflexes. No cranial nerve deficit. Motor grossly intact Skin: Skin is warm and dry. No rash noted.  Psychiatric:  Has normal mood and affect. Behavior is normal.     Assessment & Plan:

## 2014-12-19 NOTE — Assessment & Plan Note (Signed)

## 2014-12-19 NOTE — Addendum Note (Signed)
Addended by: Valerie Salts on: 12/19/2014 09:30 AM   Modules accepted: Orders

## 2014-12-19 NOTE — Progress Notes (Signed)
Pre visit review using our clinic review tool, if applicable. No additional management support is needed unless otherwise documented below in the visit note. 

## 2014-12-19 NOTE — Assessment & Plan Note (Signed)
Also for a1c with next labs, to follow diet, exercise, wt loss

## 2014-12-19 NOTE — Patient Instructions (Signed)
Please continue all other medications as before, and refills have been done if requested.  Please have the pharmacy call with any other refills you may need.  Please continue your efforts at being more active, low cholesterol diet, and weight control.  You are otherwise up to date with prevention measures today.  Please keep your appointments with your specialists as you may have planned  Your EKG was OK today, as were the Labs discussed as well  Please return in 1 year for your yearly visit, or sooner if needed, with Lab testing done 3-5 days before

## 2015-01-14 ENCOUNTER — Ambulatory Visit: Payer: BC Managed Care – PPO | Admitting: Gynecology

## 2015-01-30 ENCOUNTER — Ambulatory Visit: Payer: BC Managed Care – PPO | Admitting: Obstetrics and Gynecology

## 2015-05-27 ENCOUNTER — Ambulatory Visit (INDEPENDENT_AMBULATORY_CARE_PROVIDER_SITE_OTHER): Payer: 59 | Admitting: Internal Medicine

## 2015-05-27 VITALS — BP 132/84 | HR 76 | Temp 97.8°F | Ht 65.0 in | Wt 218.0 lb

## 2015-05-27 DIAGNOSIS — R739 Hyperglycemia, unspecified: Secondary | ICD-10-CM

## 2015-05-27 DIAGNOSIS — I10 Essential (primary) hypertension: Secondary | ICD-10-CM | POA: Diagnosis not present

## 2015-05-27 DIAGNOSIS — J069 Acute upper respiratory infection, unspecified: Secondary | ICD-10-CM | POA: Diagnosis not present

## 2015-05-27 MED ORDER — LEVOFLOXACIN 250 MG PO TABS
250.0000 mg | ORAL_TABLET | Freq: Every day | ORAL | Status: DC
Start: 2015-05-27 — End: 2015-12-17

## 2015-05-27 NOTE — Progress Notes (Signed)
Subjective:    Patient ID: Mary Ward, female    DOB: May 30, 1946, 69 y.o.   MRN: [id number]  HPI   Here with 2-3 days acute onset fever, facial pain, pressure, headache, general weakness and malaise, and greenish d/c, with mild ST and cough, but pt denies chest pain, wheezing, increased sob or doe, orthopnea, PND, increased LE swelling, palpitations, dizziness or syncope. Pt denies new neurological symptoms such as new headache, or facial or extremity weakness or numbness   Pt denies polydipsia, polyuria, Past Medical History  Diagnosis Date  . Hyperlipidemia   . Obesity   . Hypertension   . Hypothyroidism   . GERD (gastroesophageal reflux disease)   . EN (erythema nodosum)   . Hyperplastic colonic polyp   . Allergy   . Anxiety     no per pt  . Cataract     per pt "small one starting", no need for treatment at this time   Past Surgical History  Procedure Laterality Date  . Oophorectomy    . Tonsillectomy    . Breast reduction surgery  2002  . Hammer toe surgery      right 2nd toe  . Laparoscopic assisted vaginal hysterectomy  1992    with BSO  . Cyst excision  12/15/12    punch biopsy left shoulder blade  . Colonoscopy    . Bunionectomy      both feet    reports that she has never smoked. She has never used smokeless tobacco. She reports that she drinks about 0.5 oz of alcohol per week. She reports that she does not use illicit drugs. family history includes Cancer in her father; Diabetes in her sister; Heart attack in her mother; Heart disease in her sister; Hyperlipidemia in her mother; Hypertension in her mother and sister; Stroke in her mother. There is no history of Colon cancer, Esophageal cancer, Rectal cancer, or Stomach cancer. Allergies  Allergen Reactions  . Indomethacin     Took with ASA and caused severe bloating  . Prednisone     Developed acne  . Prevnar 13 [Pneumococcal 13-Val Conj Vacc]     Arm became very sore and pt developed "fiery red knot" at site    . Tetanus Toxoid     Entire arm became swollen and turned red  . Tetracycline     "hot, red knots" below knees   Current Outpatient Prescriptions on File Prior to Visit  Medication Sig Dispense Refill  . aspirin 81 MG tablet Take 81 mg by mouth daily.      Marland Kitchen atorvastatin (LIPITOR) 20 MG tablet Take 1 tablet (20 mg total) by mouth daily. 90 tablet 3  . diphenhydrAMINE (BENADRYL) 12.5 MG/5ML elixir Take by mouth as needed for allergies.    Marland Kitchen levothyroxine (SYNTHROID, LEVOTHROID) 137 MCG tablet Take 1 tablet (137 mcg total) by mouth daily. Brand Name only 90 tablet 3  . telmisartan-hydrochlorothiazide (MICARDIS HCT) 40-12.5 MG per tablet Take 1 tablet by mouth daily. 90 tablet 3   No current facility-administered medications on file prior to visit.   Review of Systems  Constitutional: Negative for unusual diaphoresis or night sweats HENT: Negative for ringing in ear or discharge Eyes: Negative for double vision or worsening visual disturbance.  Respiratory: Negative for choking and stridor.   Gastrointestinal: Negative for vomiting or other signifcant bowel change Genitourinary: Negative for hematuria or change in urine volume.  Musculoskeletal: Negative for other MSK pain or swelling Skin: Negative for color change  and worsening wound.  Neurological: Negative for tremors and numbness other than noted  Psychiatric/Behavioral: Negative for decreased concentration or agitation other than above       Objective:   Physical Exam BP 132/84 mmHg  Pulse 76  Temp(Src) 97.8 F (36.6 C) (Oral)  Ht 5\' 5"  (1.651 m)  Wt 218 lb (98.884 kg)  BMI 36.28 kg/m2  SpO2 95%  LMP 09/13/1994 VS noted, mild ill Constitutional: Pt appears in no significant distress HENT: Head: NCAT.  Right Ear: External ear normal.  Left Ear: External ear normal.  Eyes: . Pupils are equal, round, and reactive to light. Conjunctivae and EOM are normal Bilat tm's with mild erythema.  Max sinus areas mild tender.   Pharynx with mild erythema, no exudate Neck: Normal range of motion. Neck supple.  Cardiovascular: Normal rate and regular rhythm.   Pulmonary/Chest: Effort normal and breath sounds without rales or wheezing.  Neurological: Pt is alert. Not confused , motor grossly intact Skin: Skin is warm. No rash, no LE edema Psychiatric: Pt behavior is normal. No agitation.     Assessment & Plan:

## 2015-05-27 NOTE — Patient Instructions (Signed)
Please take all new medication as prescribed - the antibiotic  You can also take Delsym OTC for cough, and/or Mucinex (or it's generic off brand) for congestion, and tylenol as needed for pain.  Please continue all other medications as before, and refills have been done if requested.  Please have the pharmacy call with any other refills you may need.  Please keep your appointments with your specialists as you may have planned    

## 2015-05-27 NOTE — Progress Notes (Signed)
Pre visit review using our clinic review tool, if applicable. No additional management support is needed unless otherwise documented below in the visit note. 

## 2015-06-01 ENCOUNTER — Encounter: Payer: Self-pay | Admitting: Internal Medicine

## 2015-06-01 NOTE — Assessment & Plan Note (Signed)
stable overall by history and exam, recent data reviewed with pt, and pt to continue medical treatment as before,  to f/u any worsening symptoms or concerns BP Readings from Last 3 Encounters:  05/27/15 132/84  12/19/14 122/80  01/22/14 131/89

## 2015-06-01 NOTE — Assessment & Plan Note (Signed)
stable overall by history and exam, recent data reviewed with pt, and pt to continue medical treatment as before,  to f/u any worsening symptoms or concerns Lab Results  Component Value Date   HGBA1C 6.2 11/12/2013

## 2015-06-01 NOTE — Assessment & Plan Note (Signed)
Mild to mod, for antibx course,  to f/u any worsening symptoms or concerns 

## 2015-12-17 ENCOUNTER — Ambulatory Visit (INDEPENDENT_AMBULATORY_CARE_PROVIDER_SITE_OTHER): Payer: 59 | Admitting: Internal Medicine

## 2015-12-17 ENCOUNTER — Encounter: Payer: Self-pay | Admitting: Internal Medicine

## 2015-12-17 VITALS — BP 140/80 | HR 94 | Temp 98.2°F | Resp 20 | Wt 213.0 lb

## 2015-12-17 DIAGNOSIS — Z1159 Encounter for screening for other viral diseases: Secondary | ICD-10-CM | POA: Diagnosis not present

## 2015-12-17 DIAGNOSIS — R739 Hyperglycemia, unspecified: Secondary | ICD-10-CM

## 2015-12-17 DIAGNOSIS — R05 Cough: Secondary | ICD-10-CM

## 2015-12-17 DIAGNOSIS — R059 Cough, unspecified: Secondary | ICD-10-CM

## 2015-12-17 DIAGNOSIS — I10 Essential (primary) hypertension: Secondary | ICD-10-CM | POA: Diagnosis not present

## 2015-12-17 MED ORDER — LEVOFLOXACIN 500 MG PO TABS: 500.0000 mg | ORAL_TABLET | Freq: Every day | ORAL | Status: DC

## 2015-12-17 NOTE — Patient Instructions (Signed)
Please take all new medication as prescribed - the antibiotic  You can also take Delsym OTC for cough, and/or Mucinex (or it's generic off brand) for congestion, and tylenol as needed for pain.  Please continue all other medications as before, and refills have been done if requested.  Please have the pharmacy call with any other refills you may need.  Please keep your appointments with your specialists as you may have planned    

## 2015-12-17 NOTE — Progress Notes (Signed)
Pre visit review using our clinic review tool, if applicable. No additional management support is needed unless otherwise documented below in the visit note. 

## 2015-12-18 ENCOUNTER — Other Ambulatory Visit (INDEPENDENT_AMBULATORY_CARE_PROVIDER_SITE_OTHER): Payer: 59

## 2015-12-18 DIAGNOSIS — R739 Hyperglycemia, unspecified: Secondary | ICD-10-CM | POA: Diagnosis not present

## 2015-12-18 DIAGNOSIS — R05 Cough: Secondary | ICD-10-CM | POA: Insufficient documentation

## 2015-12-18 DIAGNOSIS — R059 Cough, unspecified: Secondary | ICD-10-CM | POA: Insufficient documentation

## 2015-12-18 DIAGNOSIS — Z0000 Encounter for general adult medical examination without abnormal findings: Secondary | ICD-10-CM

## 2015-12-18 DIAGNOSIS — Z1159 Encounter for screening for other viral diseases: Secondary | ICD-10-CM

## 2015-12-18 LAB — URINALYSIS, ROUTINE W REFLEX MICROSCOPIC
Bilirubin Urine: NEGATIVE
Hgb urine dipstick: NEGATIVE
Ketones, ur: NEGATIVE
Leukocytes, UA: NEGATIVE
Nitrite: NEGATIVE
RBC / HPF: NONE SEEN (ref 0–?)
SPECIFIC GRAVITY, URINE: 1.015 (ref 1.000–1.030)
Total Protein, Urine: NEGATIVE
Urine Glucose: NEGATIVE
Urobilinogen, UA: 0.2 (ref 0.0–1.0)
pH: 6.5 (ref 5.0–8.0)

## 2015-12-18 LAB — LIPID PANEL
Cholesterol: 140 mg/dL (ref 0–200)
HDL: 39.9 mg/dL (ref >39.00)
LDL Cholesterol: 82 mg/dL (ref 0–99)
NONHDL: 99.71
Total CHOL/HDL Ratio: 3
Triglycerides: 89 mg/dL (ref 0.0–149.0)
VLDL: 17.8 mg/dL (ref 0.0–40.0)

## 2015-12-18 LAB — HEPATIC FUNCTION PANEL
ALK PHOS: 86 U/L (ref 39–117)
ALT: 20 U/L (ref 0–35)
AST: 14 U/L (ref 0–37)
Albumin: 3.9 g/dL (ref 3.5–5.2)
BILIRUBIN TOTAL: 0.7 mg/dL (ref 0.2–1.2)
Bilirubin, Direct: 0.1 mg/dL (ref 0.0–0.3)
Total Protein: 7 g/dL (ref 6.0–8.3)

## 2015-12-18 LAB — CBC WITH DIFFERENTIAL/PLATELET
BASOS PCT: 0.3 % (ref 0.0–3.0)
Basophils Absolute: 0 10*3/uL (ref 0.0–0.1)
Eosinophils Absolute: 0.3 10*3/uL (ref 0.0–0.7)
Eosinophils Relative: 3.1 % (ref 0.0–5.0)
HEMATOCRIT: 41.6 % (ref 36.0–46.0)
Hemoglobin: 13.8 g/dL (ref 12.0–15.0)
LYMPHS ABS: 2.7 10*3/uL (ref 0.7–4.0)
LYMPHS PCT: 25.3 % (ref 12.0–46.0)
MCHC: 33.3 g/dL (ref 30.0–36.0)
MCV: 82.8 fl (ref 78.0–100.0)
MONOS PCT: 7 % (ref 3.0–12.0)
Monocytes Absolute: 0.8 10*3/uL (ref 0.1–1.0)
NEUTROS ABS: 7 10*3/uL (ref 1.4–7.7)
NEUTROS PCT: 64.3 % (ref 43.0–77.0)
PLATELETS: 162 10*3/uL (ref 150.0–400.0)
RBC: 5.02 Mil/uL (ref 3.87–5.11)
RDW: 13.5 % (ref 11.5–15.5)
WBC: 10.9 10*3/uL — ABNORMAL HIGH (ref 4.0–10.5)

## 2015-12-18 LAB — BASIC METABOLIC PANEL
BUN: 12 mg/dL (ref 6–23)
CALCIUM: 9 mg/dL (ref 8.4–10.5)
CHLORIDE: 107 meq/L (ref 96–112)
CO2: 28 mEq/L (ref 19–32)
CREATININE: 0.79 mg/dL (ref 0.40–1.20)
GFR: 76.84 mL/min (ref >60.00)
Glucose, Bld: 111 mg/dL — ABNORMAL HIGH (ref 70–99)
Potassium: 4.4 mEq/L (ref 3.5–5.1)
SODIUM: 142 meq/L (ref 135–145)

## 2015-12-18 LAB — HEPATITIS C ANTIBODY: HCV AB: NEGATIVE

## 2015-12-18 LAB — TSH: TSH: 0.53 u[IU]/mL (ref 0.35–4.50)

## 2015-12-18 LAB — HEMOGLOBIN A1C: Hgb A1c MFr Bld: 6.3 % (ref 4.6–6.5)

## 2015-12-18 NOTE — Assessment & Plan Note (Signed)
Mild to mod, c/w bronchitisi vs pna, declines cxr, for antibx course, cough med prn (has at home), to f/u any worsening symptoms or concerns

## 2015-12-18 NOTE — Progress Notes (Signed)
Subjective:    Patient ID: Mary Ward, female    DOB: [birth year], 70 y.o.   MRN: [id number]  HPI  Here with acute onset mild to mod 2-3 days ST, HA, general weakness and malaise, with prod cough greenish sputum, but Pt denies chest pain, increased sob or doe, wheezing, orthopnea, PND, increased LE swelling, palpitations, dizziness or syncope.  Pt denies new neurological symptoms such as new headache, or facial or extremity weakness or numbness   Pt denies polydipsia, polyuria,t states overall good compliance with meds, trying to follow lower cholesterol, diabetic diet, wt overall stable, exercise time remains a challenge     Past Medical History  Diagnosis Date  . Hyperlipidemia   . Obesity   . Hypertension   . Hypothyroidism   . GERD (gastroesophageal reflux disease)   . EN (erythema nodosum)   . Hyperplastic colonic polyp   . Allergy   . Anxiety     no per pt  . Cataract     per pt "small one starting", no need for treatment at this time   Past Surgical History  Procedure Laterality Date  . Oophorectomy    . Tonsillectomy    . Breast reduction surgery  2002  . Hammer toe surgery      right 2nd toe  . Laparoscopic assisted vaginal hysterectomy  1992    with BSO  . Cyst excision  12/15/12    punch biopsy left shoulder blade  . Colonoscopy    . Bunionectomy      both feet    reports that she has never smoked. She has never used smokeless tobacco. She reports that she drinks about 0.5 oz of alcohol per week. She reports that she does not use illicit drugs. family history includes Cancer in her father; Diabetes in her sister; Heart attack in her mother; Heart disease in her sister; Hyperlipidemia in her mother; Hypertension in her mother and sister; Stroke in her mother. There is no history of Colon cancer, Esophageal cancer, Rectal cancer, or Stomach cancer. Allergies  Allergen Reactions  . Indomethacin     Took with ASA and caused severe bloating  . Prednisone    Developed acne  . Prevnar 13 [Pneumococcal 13-Val Conj Vacc]     Arm became very sore and pt developed "fiery red knot" at site  . Tetanus Toxoid     Entire arm became swollen and turned red  . Tetracycline     "hot, red knots" below knees   Current Outpatient Prescriptions on File Prior to Visit  Medication Sig Dispense Refill  . aspirin 81 MG tablet Take 81 mg by mouth daily.      Marland Kitchen atorvastatin (LIPITOR) 20 MG tablet Take 1 tablet (20 mg total) by mouth daily. 90 tablet 3  . diphenhydrAMINE (BENADRYL) 12.5 MG/5ML elixir Take by mouth as needed for allergies.    Marland Kitchen levothyroxine (SYNTHROID, LEVOTHROID) 137 MCG tablet Take 1 tablet (137 mcg total) by mouth daily. Brand Name only 90 tablet 3  . telmisartan-hydrochlorothiazide (MICARDIS HCT) 40-12.5 MG per tablet Take 1 tablet by mouth daily. 90 tablet 3   No current facility-administered medications on file prior to visit.   Review of Systems  Constitutional: Negative for unusual diaphoresis or night sweats HENT: Negative for ear swelling or discharge Eyes: Negative for worsening visual haziness  Respiratory: Negative for choking and stridor.   Gastrointestinal: Negative for distension or worsening eructation Genitourinary: Negative for retention or change in urine volume.  Musculoskeletal: Negative for other MSK pain or swelling Skin: Negative for color change and worsening wound Neurological: Negative for tremors and numbness other than noted  Psychiatric/Behavioral: Negative for decreased concentration or agitation other than above       Objective:   Physical Exam BP 140/80 mmHg  Pulse 94  Temp(Src) 98.2 F (36.8 C) (Oral)  Resp 20  Wt 213 lb (96.616 kg)  SpO2 96%  LMP 09/13/1994 VS noted, mild ill Constitutional: Pt appears in no apparent distress HENT: Head: NCAT.  Right Ear: External ear normal.  Left Ear: External ear normal.  Bilat tm's with mild erythema.  Max sinus areas mild tender.  Pharynx with mild erythema,  no exudate Eyes: . Pupils are equal, round, and reactive to light. Conjunctivae and EOM are normal Neck: Normal range of motion. Neck supple.  Cardiovascular: Normal rate and regular rhythm.   Pulmonary/Chest: Effort normal and breath sounds without rales or wheezing.  Neurological: Pt is alert. Not confused , motor grossly intact Skin: Skin is warm. No rash, no LE edema Psychiatric: Pt behavior is normal. No agitation.     Assessment & Plan:

## 2015-12-18 NOTE — Assessment & Plan Note (Signed)
Borderline elev today liekly situational, o/w stable overall by history and exam, recent data reviewed with pt, and pt to continue medical treatment as before,  to f/u any worsening symptoms or concerns BP Readings from Last 3 Encounters:  12/17/15 140/80  05/27/15 132/84  12/19/14 122/80

## 2015-12-18 NOTE — Assessment & Plan Note (Signed)
Asympt, stable overall by history and exam, recent data reviewed with pt, and pt to continue medical treatment as before,  to f/u any worsening symptoms or concerns Lab Results  Component Value Date   HGBA1C 6.2 11/12/2013   For f/u soon with cpx next wk, to call for onset polys symptoms or cbg > 200 with illness

## 2015-12-23 ENCOUNTER — Ambulatory Visit (INDEPENDENT_AMBULATORY_CARE_PROVIDER_SITE_OTHER): Payer: 59 | Admitting: Internal Medicine

## 2015-12-23 ENCOUNTER — Encounter: Payer: Self-pay | Admitting: Internal Medicine

## 2015-12-23 VITALS — BP 126/84 | HR 67 | Temp 97.8°F | Resp 20 | Wt 216.0 lb

## 2015-12-23 DIAGNOSIS — Z0001 Encounter for general adult medical examination with abnormal findings: Secondary | ICD-10-CM

## 2015-12-23 DIAGNOSIS — E785 Hyperlipidemia, unspecified: Secondary | ICD-10-CM | POA: Diagnosis not present

## 2015-12-23 DIAGNOSIS — Z0000 Encounter for general adult medical examination without abnormal findings: Secondary | ICD-10-CM

## 2015-12-23 DIAGNOSIS — E039 Hypothyroidism, unspecified: Secondary | ICD-10-CM

## 2015-12-23 DIAGNOSIS — E669 Obesity, unspecified: Secondary | ICD-10-CM

## 2015-12-23 DIAGNOSIS — R739 Hyperglycemia, unspecified: Secondary | ICD-10-CM

## 2015-12-23 DIAGNOSIS — I10 Essential (primary) hypertension: Secondary | ICD-10-CM

## 2015-12-23 DIAGNOSIS — R6889 Other general symptoms and signs: Secondary | ICD-10-CM

## 2015-12-23 MED ORDER — TELMISARTAN-HCTZ 40-12.5 MG PO TABS: 1.0000 | ORAL_TABLET | Freq: Every day | ORAL | Status: DC

## 2015-12-23 MED ORDER — ATORVASTATIN CALCIUM 20 MG PO TABS: 20.0000 mg | ORAL_TABLET | Freq: Every day | ORAL | Status: DC

## 2015-12-23 MED ORDER — PHENTERMINE HCL 37.5 MG PO CAPS: 37.5000 mg | ORAL_CAPSULE | ORAL | Status: DC

## 2015-12-23 MED ORDER — LEVOTHYROXINE SODIUM 137 MCG PO TABS: 137.0000 ug | ORAL_TABLET | Freq: Every day | ORAL | Status: DC

## 2015-12-23 NOTE — Patient Instructions (Signed)
Please take all new medication as prescribed - the phentermine  OK to take OTC B12 vitamin - 1 per day  Please continue all other medications as before, and refills have been done if requested.  Please have the pharmacy call with any other refills you may need.  Please continue your efforts at being more active, low cholesterol diet, and weight control.  You are otherwise up to date with prevention measures today.  Please keep your appointments with your specialists as you may have planned  Please return in 1 year for your yearly visit, or sooner if needed, with Lab testing done 3-5 days before

## 2015-12-23 NOTE — Assessment & Plan Note (Signed)
stable overall by history and exam, recent data reviewed with pt, and pt to continue medical treatment as before,  to f/u any worsening symptoms or concerns Lab Results  Component Value Date   LDLCALC 82 12/18/2015   Goal < 70 - cont diet efforts and meds

## 2015-12-23 NOTE — Progress Notes (Addendum)
Subjective:    Patient ID: Mary Ward, female    DOB: Sep 08, 1946, 69 y.o.   MRN: [id number]  HPI  Here for wellness and f/u;  Overall doing ok;  Pt denies Chest pain, worsening SOB, DOE, wheezing, orthopnea, PND, worsening LE edema, palpitations, dizziness or syncope.  Pt denies neurological change such as new headache, facial or extremity weakness.  Pt denies polydipsia, polyuria, or low sugar symptoms. Pt states overall good compliance with treatment and medications, good tolerability, and has been trying to follow appropriate diet.  Pt denies worsening depressive symptoms, suicidal ideation or panic. No fever, night sweats, wt loss, loss of appetite, or other constitutional symptoms.  Pt states good ability with ADL's, has low fall risk, home safety reviewed and adequate, no other significant changes in hearing or vision, and only occasionally active with exercise. Unable to lose wt despite diet. Working full time, enjoys it and will likely work at least one more year. Cough resolved from last visit, finished medications.  Asks about taking b12 supplement.  Denies hyper or hypo thyroid symptoms such as voice, skin or hair change. Past Medical History  Diagnosis Date  . Hyperlipidemia   . Obesity   . Hypertension   . Hypothyroidism   . GERD (gastroesophageal reflux disease)   . EN (erythema nodosum)   . Hyperplastic colonic polyp   . Allergy   . Anxiety     no per pt  . Cataract     per pt "small one starting", no need for treatment at this time   Past Surgical History  Procedure Laterality Date  . Oophorectomy    . Tonsillectomy    . Breast reduction surgery  2002  . Hammer toe surgery      right 2nd toe  . Laparoscopic assisted vaginal hysterectomy  1992    with BSO  . Cyst excision  12/15/12    punch biopsy left shoulder blade  . Colonoscopy    . Bunionectomy      both feet    reports that she has never smoked. She has never used smokeless tobacco. She reports that she  drinks about 0.5 oz of alcohol per week. She reports that she does not use illicit drugs. family history includes Cancer in her father; Diabetes in her sister; Heart attack in her mother; Heart disease in her sister; Hyperlipidemia in her mother; Hypertension in her mother and sister; Stroke in her mother. There is no history of Colon cancer, Esophageal cancer, Rectal cancer, or Stomach cancer. Allergies  Allergen Reactions  . Indomethacin     Took with ASA and caused severe bloating  . Prednisone     Developed acne  . Prevnar 13 [Pneumococcal 13-Val Conj Vacc]     Arm became very sore and pt developed "fiery red knot" at site  . Tetanus Toxoid     Entire arm became swollen and turned red  . Tetracycline     "hot, red knots" below knees   Current Outpatient Prescriptions on File Prior to Visit  Medication Sig Dispense Refill  . aspirin 81 MG tablet Take 81 mg by mouth daily.      . diphenhydrAMINE (BENADRYL) 12.5 MG/5ML elixir Take by mouth as needed for allergies.     No current facility-administered medications on file prior to visit.   Review of Systems Constitutional: Negative for increased diaphoresis, or other activity, appetite or siginficant weight change other than noted HENT: Negative for worsening hearing loss, ear pain, facial  swelling, mouth sores and neck stiffness.   Eyes: Negative for other worsening pain, redness or visual disturbance.  Respiratory: Negative for choking or stridor Cardiovascular: Negative for other chest pain and palpitations.  Gastrointestinal: Negative for worsening diarrhea, blood in stool, or abdominal distention Genitourinary: Negative for hematuria, flank pain or change in urine volume.  Musculoskeletal: Negative for myalgias or other joint complaints.  Skin: Negative for other color change and wound or drainage.  Neurological: Negative for syncope and numbness. other than noted Hematological: Negative for adenopathy. or other  swelling Psychiatric/Behavioral: Negative for hallucinations, SI, self-injury, decreased concentration or other worsening agitation.      Objective:   Physical Exam BP 126/84 mmHg  Pulse 67  Temp(Src) 97.8 F (36.6 C) (Oral)  Resp 20  Wt 216 lb (97.977 kg)  SpO2 94%  LMP 09/13/1994 VS noted, not ill appearing, obese Constitutional: Pt is oriented to person, place, and time. Appears well-developed and well-nourished, in no significant distress Head: Normocephalic and atraumatic  Eyes: Conjunctivae and EOM are normal. Pupils are equal, round, and reactive to light Right Ear: External ear normal.  Left Ear: External ear normal Nose: Nose normal.  Mouth/Throat: Oropharynx is clear and moist  Neck: Normal range of motion. Neck supple. No JVD present. No tracheal deviation present or significant neck LA or mass Cardiovascular: Normal rate, regular rhythm, normal heart sounds and intact distal pulses.   Pulmonary/Chest: Effort normal and breath sounds without rales or wheezing  Abdominal: Soft. Bowel sounds are normal. NT. No HSM  Musculoskeletal: Normal range of motion. Exhibits no edema Lymphadenopathy: Has no cervical adenopathy.  Neurological: Pt is alert and oriented to person, place, and time. Pt has normal reflexes. No cranial nerve deficit. Motor grossly intact Skin: Skin is warm and dry. No rash noted or new ulcers Psychiatric:  Has normal mood and affect. Behavior is normal.   ECG reviewed  NSR, no acute changes ST or T waves    Assessment & Plan:

## 2015-12-23 NOTE — Progress Notes (Signed)
Pre visit review using our clinic review tool, if applicable. No additional management support is needed unless otherwise documented below in the visit note. 

## 2015-12-23 NOTE — Assessment & Plan Note (Signed)
stable overall by history and exam, recent data reviewed with pt, and pt to continue medical treatment as before,  to f/u any worsening symptoms or concerns Lab Results  Component Value Date   TSH 0.53 12/18/2015

## 2015-12-23 NOTE — Assessment & Plan Note (Signed)

## 2015-12-23 NOTE — Assessment & Plan Note (Signed)
stable overall by history and exam, recent data reviewed with pt, and pt to continue medical treatment as before,  to f/u any worsening symptoms or concerns BP Readings from Last 3 Encounters:  12/23/15 126/84  12/17/15 140/80  05/27/15 132/84

## 2015-12-23 NOTE — Assessment & Plan Note (Signed)
stable overall by history and exam, recent data reviewed with pt, and pt to continue medical treatment as before,  to f/u any worsening symptoms or concerns Lab Results  Component Value Date   HGBA1C 6.3 12/18/2015

## 2015-12-23 NOTE — Assessment & Plan Note (Addendum)
[place] for phentermine asd,  to f/u any worsening symptoms or concerns, cont diet and exercise  In addition to the time spent performing CPE, I spent an additional 40 minutes face to face,in which greater than 50% of this time was spent in counseling and coordination of care for patient's acute illness as documented.

## 2016-01-21 ENCOUNTER — Telehealth: Payer: Self-pay | Admitting: *Deleted

## 2016-01-21 DIAGNOSIS — Z13820 Encounter for screening for osteoporosis: Secondary | ICD-10-CM

## 2016-01-21 NOTE — Telephone Encounter (Signed)
Left msg on triage stating her last bone density was done back in 10/2009, wanting to know when she need to have another one...Mary Ward

## 2016-01-21 NOTE — Telephone Encounter (Signed)
Notified pt w/MD response. Transferred to [facility] to make bone density appt...Johny Chess

## 2016-01-21 NOTE — Telephone Encounter (Signed)
Ok for repeat now -   Order done  [place] or staff to help with scheduling please

## 2016-01-23 ENCOUNTER — Ambulatory Visit (INDEPENDENT_AMBULATORY_CARE_PROVIDER_SITE_OTHER)
Admission: RE | Admit: 2016-01-23 | Discharge: 2016-01-23 | Disposition: A | Discharge status: Home / Self Care | Payer: 59 | Source: Ambulatory Visit | Attending: Internal Medicine | Admitting: Internal Medicine

## 2016-01-23 DIAGNOSIS — Z13820 Encounter for screening for osteoporosis: Secondary | ICD-10-CM | POA: Diagnosis not present

## 2016-07-14 ENCOUNTER — Ambulatory Visit: Payer: 59 | Admitting: Internal Medicine

## 2016-09-03 ENCOUNTER — Other Ambulatory Visit: Payer: Self-pay | Admitting: Internal Medicine

## 2016-12-17 ENCOUNTER — Other Ambulatory Visit (INDEPENDENT_AMBULATORY_CARE_PROVIDER_SITE_OTHER): Payer: 59

## 2016-12-17 DIAGNOSIS — R739 Hyperglycemia, unspecified: Secondary | ICD-10-CM

## 2016-12-17 DIAGNOSIS — Z0000 Encounter for general adult medical examination without abnormal findings: Secondary | ICD-10-CM | POA: Diagnosis not present

## 2016-12-17 LAB — HEPATIC FUNCTION PANEL
ALT: 23 U/L (ref 0–35)
AST: 20 U/L (ref 0–37)
Albumin: 3.9 g/dL (ref 3.5–5.2)
Alkaline Phosphatase: 89 U/L (ref 39–117)
BILIRUBIN TOTAL: 0.5 mg/dL (ref 0.2–1.2)
Bilirubin, Direct: 0.1 mg/dL (ref 0.0–0.3)
Total Protein: 6.9 g/dL (ref 6.0–8.3)

## 2016-12-17 LAB — LIPID PANEL
CHOL/HDL RATIO: 3
Cholesterol: 149 mg/dL (ref 0–200)
HDL: 49.8 mg/dL (ref >39.00)
LDL Cholesterol: 85 mg/dL (ref 0–99)
NonHDL: 99.3
TRIGLYCERIDES: 70 mg/dL (ref 0.0–149.0)
VLDL: 14 mg/dL (ref 0.0–40.0)

## 2016-12-17 LAB — URINALYSIS, ROUTINE W REFLEX MICROSCOPIC
BILIRUBIN URINE: NEGATIVE
Hgb urine dipstick: NEGATIVE
KETONES UR: NEGATIVE
Leukocytes, UA: NEGATIVE
NITRITE: NEGATIVE
RBC / HPF: NONE SEEN (ref 0–?)
Specific Gravity, Urine: 1.015 (ref 1.000–1.030)
Total Protein, Urine: NEGATIVE
Urine Glucose: NEGATIVE
Urobilinogen, UA: 0.2 (ref 0.0–1.0)
WBC UA: NONE SEEN (ref 0–?)
pH: 6 (ref 5.0–8.0)

## 2016-12-17 LAB — CBC WITH DIFFERENTIAL/PLATELET
BASOS PCT: 0.5 % (ref 0.0–3.0)
Basophils Absolute: 0 10*3/uL (ref 0.0–0.1)
EOS PCT: 2.9 % (ref 0.0–5.0)
Eosinophils Absolute: 0.2 10*3/uL (ref 0.0–0.7)
HEMATOCRIT: 43.2 % (ref 36.0–46.0)
Hemoglobin: 14.3 g/dL (ref 12.0–15.0)
LYMPHS PCT: 31.5 % (ref 12.0–46.0)
Lymphs Abs: 2.6 10*3/uL (ref 0.7–4.0)
MCHC: 33 g/dL (ref 30.0–36.0)
MCV: 83.8 fl (ref 78.0–100.0)
Monocytes Absolute: 0.8 10*3/uL (ref 0.1–1.0)
Monocytes Relative: 10.1 % (ref 3.0–12.0)
Neutro Abs: 4.6 10*3/uL (ref 1.4–7.7)
Neutrophils Relative %: 55 % (ref 43.0–77.0)
Platelets: 155 10*3/uL (ref 150.0–400.0)
RBC: 5.16 Mil/uL — ABNORMAL HIGH (ref 3.87–5.11)
RDW: 13.3 % (ref 11.5–15.5)
WBC: 8.3 10*3/uL (ref 4.0–10.5)

## 2016-12-17 LAB — BASIC METABOLIC PANEL
BUN: 16 mg/dL (ref 6–23)
CO2: 30 mEq/L (ref 19–32)
CREATININE: 0.82 mg/dL (ref 0.40–1.20)
Calcium: 9.4 mg/dL (ref 8.4–10.5)
Chloride: 105 mEq/L (ref 96–112)
GFR: 73.38 mL/min (ref >60.00)
Glucose, Bld: 124 mg/dL — ABNORMAL HIGH (ref 70–99)
Potassium: 4.6 mEq/L (ref 3.5–5.1)
Sodium: 140 mEq/L (ref 135–145)

## 2016-12-17 LAB — TSH: TSH: 0.28 u[IU]/mL — ABNORMAL LOW (ref 0.35–4.50)

## 2016-12-17 LAB — HEMOGLOBIN A1C: Hgb A1c MFr Bld: 6.2 % (ref 4.6–6.5)

## 2016-12-23 ENCOUNTER — Ambulatory Visit (INDEPENDENT_AMBULATORY_CARE_PROVIDER_SITE_OTHER): Payer: 59 | Admitting: Internal Medicine

## 2016-12-23 ENCOUNTER — Encounter: Payer: Self-pay | Admitting: Internal Medicine

## 2016-12-23 VITALS — BP 136/84 | HR 71 | Temp 97.8°F | Ht 66.0 in | Wt 206.0 lb

## 2016-12-23 DIAGNOSIS — F411 Generalized anxiety disorder: Secondary | ICD-10-CM

## 2016-12-23 DIAGNOSIS — Z0000 Encounter for general adult medical examination without abnormal findings: Secondary | ICD-10-CM

## 2016-12-23 DIAGNOSIS — R739 Hyperglycemia, unspecified: Secondary | ICD-10-CM | POA: Diagnosis not present

## 2016-12-23 DIAGNOSIS — Z0001 Encounter for general adult medical examination with abnormal findings: Secondary | ICD-10-CM

## 2016-12-23 DIAGNOSIS — I10 Essential (primary) hypertension: Secondary | ICD-10-CM

## 2016-12-23 DIAGNOSIS — E039 Hypothyroidism, unspecified: Secondary | ICD-10-CM | POA: Diagnosis not present

## 2016-12-23 MED ORDER — ALPRAZOLAM 0.25 MG PO TABS: 0.2500 mg | ORAL_TABLET | Freq: Two times a day (BID) | ORAL | 0 refills | Status: DC | PRN

## 2016-12-23 MED ORDER — TELMISARTAN-HCTZ 40-12.5 MG PO TABS: 1.0000 | ORAL_TABLET | Freq: Every day | ORAL | 3 refills | Status: DC

## 2016-12-23 MED ORDER — SYNTHROID 137 MCG PO TABS: 137.0000 ug | ORAL_TABLET | Freq: Every day | ORAL | 3 refills | Status: DC

## 2016-12-23 MED ORDER — ATORVASTATIN CALCIUM 20 MG PO TABS: 20.0000 mg | ORAL_TABLET | Freq: Every day | ORAL | 3 refills | Status: DC

## 2016-12-23 NOTE — Assessment & Plan Note (Signed)

## 2016-12-23 NOTE — Patient Instructions (Signed)
Please take all new medication as prescribed - the low dose xanax only if needed  Please continue all other medications as before, and refills have been done if requested.  Please have the pharmacy call with any other refills you may need.  Please continue your efforts at being more active, low cholesterol diet, and weight control.  You are otherwise up to date with prevention measures today.  Please keep your appointments with your specialists as you may have planned  Please return for LAB only for a repeat thyroid testing in the next 2-4 weeks    Please return in 1 year for your yearly visit, or sooner if needed, with Lab testing done 3-5 days before

## 2016-12-23 NOTE — Assessment & Plan Note (Signed)
With slight low TSH of unclear significance, but cant r/o very mild over-replacement that could be related to worsneing anxiety, for repeat TFT at her convenience, cont same dose for now

## 2016-12-23 NOTE — Progress Notes (Signed)
Pre visit review using our clinic review tool, if applicable. No additional management support is needed unless otherwise documented below in the visit note. 

## 2016-12-23 NOTE — Assessment & Plan Note (Signed)
stable overall by history and exam, recent data reviewed with pt, and pt to continue medical treatment as before,  to f/u any worsening symptoms or concerns BP Readings from Last 3 Encounters:  12/23/16 136/84  12/23/15 126/84  12/17/15 140/80

## 2016-12-23 NOTE — Assessment & Plan Note (Signed)
stable overall by history and exam, recent data reviewed with pt, and pt to continue medical treatment as before,  to f/u any worsening symptoms or concerns Lab Results  Component Value Date   HGBA1C 6.2 12/17/2016   

## 2016-12-23 NOTE — Assessment & Plan Note (Addendum)
Mild to mod, for low dose infrequent xanax use prn,  to f/u any worsening symptoms or concerns  In addition to the time spent performing CPE, I spent an additional 15 minutes face to face,in which greater than 50% of this time was spent in counseling and coordination of care for patient's illness as documented., in d/w pt regarding etiology of anxiety symptoms, adaptive techniques, med use, declines counseling or other tx at this time, as well as thyroid discussion, symptoms and [place] term management of hyperglycemia

## 2016-12-23 NOTE — Progress Notes (Signed)
Subjective:    Patient ID: Mary Ward, female    DOB: 07/29/1946, 71 y.o.   MRN: [id number]  HPI    Here for wellness and f/u;  Overall doing ok;  Pt denies Chest pain, worsening SOB, DOE, wheezing, orthopnea, PND, worsening LE edema, palpitations, dizziness or syncope.  Pt denies neurological change such as new headache, facial or extremity weakness.  Pt denies polydipsia, polyuria, or low sugar symptoms. Pt states overall good compliance with treatment and medications, good tolerability, and has been trying to follow appropriate diet.  Pt denies worsening depressive symptoms, suicidal ideation or panic. No fever, night sweats, wt loss, loss of appetite, or other constitutional symptoms.  Pt states good ability with ADL's, has low fall risk, home safety reviewed and adequate, no other significant changes in hearing or vision, and only occasionally active with exercise. Has been stressed with dog's illness recently.  Declines tetanus BP Readings from Last 3 Encounters:  12/23/16 136/84  12/23/15 126/84  12/17/15 140/80   Wt Readings from Last 3 Encounters:  12/23/16 206 lb (93.4 kg)  12/23/15 216 lb (98 kg)  12/17/15 213 lb (96.6 kg)  Dog has malignant tumor and struggling with 70 pills per wk, very sad and tearful , ongoing for 1.5 yrs, dog overall is doing well, pt just constantly worries about her all there time, very attached. All of which cost thousands of dollars.  May have a UTI now?.  Denies hyper or hypo thyroid symptoms such as voice, skin or hair change. Also mentions low mid abd pain, ? Gas or constipation, suggested she might have IBS per GYN and seems better with fiber and more fluids.  Still have some pressure with bending forward sometimes. Has hx of diverticulosis and wonders about this but no fever, pain or bleeding. Past Medical History:  Diagnosis Date  . Allergy   . Anxiety    no per pt  . Cataract    per pt "small one starting", no need for treatment at this time  . EN  (erythema nodosum)   . GERD (gastroesophageal reflux disease)   . Hyperlipidemia   . Hyperplastic colonic polyp   . Hypertension   . Hypothyroidism   . Obesity    Past Surgical History:  Procedure Laterality Date  . BREAST REDUCTION SURGERY  2002  . BUNIONECTOMY     both feet  . COLONOSCOPY    . CYST EXCISION  12/15/12   punch biopsy left shoulder blade  . HAMMER TOE SURGERY     right 2nd toe  . LAPAROSCOPIC ASSISTED VAGINAL HYSTERECTOMY  1992   with BSO  . OOPHORECTOMY    . TONSILLECTOMY      reports that she has never smoked. She has never used smokeless tobacco. She reports that she drinks about 0.5 oz of alcohol per week . She reports that she does not use drugs. family history includes Cancer in her father; Diabetes in her sister; Heart attack in her mother; Heart disease in her sister; Hyperlipidemia in her mother; Hypertension in her mother and sister; Stroke in her mother. Allergies  Allergen Reactions  . Indomethacin     Took with ASA and caused severe bloating  . Prednisone     Developed acne  . Prevnar 13 [Pneumococcal 13-Val Conj Vacc]     Arm became very sore and pt developed "fiery red knot" at site  . Tetanus Toxoid     Entire arm became swollen and turned red  .  Tetracycline     "hot, red knots" below knees   Current Outpatient Prescriptions on File Prior to Visit  Medication Sig Dispense Refill  . aspirin 81 MG tablet Take 81 mg by mouth daily.      Marland Kitchen atorvastatin (LIPITOR) 20 MG tablet TAKE 1 TABLET BY MOUTH  DAILY 90 tablet 0  . diphenhydrAMINE (BENADRYL) 12.5 MG/5ML elixir Take by mouth as needed for allergies.    . phentermine 37.5 MG capsule Take 1 capsule (37.5 mg total) by mouth every morning. 30 capsule 2  . SYNTHROID 137 MCG tablet TAKE 1 TABLET BY MOUTH  DAILY 90 tablet 0  . telmisartan-hydrochlorothiazide (MICARDIS HCT) 40-12.5 MG tablet TAKE 1 TABLET BY MOUTH  DAILY 90 tablet 0   No current facility-administered medications on file prior to  visit.    Review of Systems Constitutional: Negative for other unusual diaphoresis, sweats, appetite or weight changes HENT: Negative for other worsening hearing loss, ear pain, facial swelling, mouth sores or neck stiffness.   Eyes: Negative for other worsening pain, redness or other visual disturbance.  Respiratory: Negative for other stridor or swelling Cardiovascular: Negative for other palpitations or other chest pain  Gastrointestinal: Negative for worsening diarrhea or loose stools, blood in stool, distention or other pain Genitourinary: Negative for hematuria, flank pain or other change in urine volume.  Musculoskeletal: Negative for myalgias or other joint swelling.  Skin: Negative for other color change, or other wound or worsening drainage.  Neurological: Negative for other syncope or numbness. Hematological: Negative for other adenopathy or swelling Psychiatric/Behavioral: Negative for hallucinations, other worsening agitation, SI, self-injury, or new decreased concentration All other system neg per pt    Objective:   Physical Exam BP 136/84   Pulse 71   Temp 97.8 F (36.6 C) (Oral)   Ht 5\' 6"  (1.676 m)   Wt 206 lb (93.4 kg)   LMP 09/13/1994   SpO2 98%   BMI 33.25 kg/m  VS noted, obese Constitutional: Pt is oriented to person, place, and time. Appears well-developed and well-nourished, in no significant distress and comfortable Head: Normocephalic and atraumatic  Eyes: Conjunctivae and EOM are normal. Pupils are equal, round, and reactive to light Right Ear: External ear normal without discharge Left Ear: External ear normal without discharge Nose: Nose without discharge or deformity Mouth/Throat: Oropharynx is without other ulcerations and moist  Neck: Normal range of motion. Neck supple. No JVD present. No tracheal deviation present or significant neck LA or mass Cardiovascular: Normal rate, regular rhythm, normal heart sounds and intact distal pulses.     Pulmonary/Chest: WOB normal and breath sounds without rales or wheezing  Abdominal: Soft. Bowel sounds are normal. NT. No HSM  Musculoskeletal: Normal range of motion. Exhibits no edema Lymphadenopathy: Has no other cervical adenopathy.  Neurological: Pt is alert and oriented to person, place, and time. Pt has normal reflexes. No cranial nerve deficit. Motor grossly intact, Gait intact Skin: Skin is warm and dry. No rash noted or new ulcerations Psychiatric:  Has nervous dysphoric mood and affect, tearful at times. Behavior is normal without agitation No other exam findings   Lab Results  Component Value Date   WBC 8.3 12/17/2016   HGB 14.3 12/17/2016   HCT 43.2 12/17/2016   PLT 155.0 12/17/2016   GLUCOSE 124 (H) 12/17/2016   CHOL 149 12/17/2016   TRIG 70.0 12/17/2016   HDL 49.80 12/17/2016   LDLCALC 85 12/17/2016   ALT 23 12/17/2016   AST 20 12/17/2016  NA 140 12/17/2016   K 4.6 12/17/2016   CL 105 12/17/2016   CREATININE 0.82 12/17/2016   BUN 16 12/17/2016   CO2 30 12/17/2016   TSH 0.28 (L) 12/17/2016   HGBA1C 6.2 12/17/2016   ECG today I have personally interpreted Sinus  Rhythm  Poor R-wave progression -may be secondary to pulmonary disease    Assessment & Plan:

## 2016-12-29 ENCOUNTER — Other Ambulatory Visit (INDEPENDENT_AMBULATORY_CARE_PROVIDER_SITE_OTHER): Payer: 59

## 2016-12-29 DIAGNOSIS — E039 Hypothyroidism, unspecified: Secondary | ICD-10-CM | POA: Diagnosis not present

## 2016-12-29 LAB — T4, FREE: Free T4: 1.28 ng/dL (ref 0.60–1.60)

## 2016-12-29 LAB — TSH: TSH: 0.15 u[IU]/mL — ABNORMAL LOW (ref 0.35–4.50)

## 2016-12-30 ENCOUNTER — Other Ambulatory Visit: Payer: Self-pay | Admitting: Internal Medicine

## 2016-12-30 ENCOUNTER — Telehealth: Payer: Self-pay

## 2016-12-30 DIAGNOSIS — E039 Hypothyroidism, unspecified: Secondary | ICD-10-CM

## 2016-12-30 MED ORDER — LEVOTHYROXINE SODIUM 125 MCG PO TABS: 125.0000 ug | ORAL_TABLET | Freq: Every day | ORAL | 3 refills | Status: DC

## 2016-12-30 NOTE — Telephone Encounter (Signed)
Called pt from personal cell phone, informed her of phone and fax issue but she could view her lab results on MyChart and if she has any questions write the office through there.

## 2016-12-30 NOTE — Telephone Encounter (Signed)
-----   Message from Biagio Borg, MD sent at 12/30/2016  9:02 AM EDT ----- Left message on MyChart, pt to cont same tx except  The test results show that your current treatment is OK, except the TSH is low.  This means you have actually too much medication treatment.  We should decrease the synthroid from 137 per day, to 125 mcg per day.  Please plan to return for LAB only in 4 wks to recheck.Redmond Baseman to please inform pt, I will do rx and order for next labs

## 2017-01-06 ENCOUNTER — Telehealth: Payer: Self-pay | Admitting: *Deleted

## 2017-01-06 NOTE — Telephone Encounter (Signed)
Rec'd call from pharmacist w/[facility] wanting to clarify if new Levothyroxine should be filled as brand name. In the past pt has always received brand name, but rx that was sent for 125 mcg did not state. Per chart pt is wanting brand name inform [facility] ok to dispense brand name. Updated script...Mary Ward

## 2017-02-14 ENCOUNTER — Other Ambulatory Visit (INDEPENDENT_AMBULATORY_CARE_PROVIDER_SITE_OTHER): Payer: 59

## 2017-02-14 DIAGNOSIS — E039 Hypothyroidism, unspecified: Secondary | ICD-10-CM | POA: Diagnosis not present

## 2017-02-14 LAB — T4, FREE: Free T4: 1.31 ng/dL (ref 0.60–1.60)

## 2017-02-14 LAB — TSH: TSH: 0.37 u[IU]/mL (ref 0.35–4.50)

## 2017-03-15 ENCOUNTER — Ambulatory Visit (INDEPENDENT_AMBULATORY_CARE_PROVIDER_SITE_OTHER): Payer: 59 | Admitting: Internal Medicine

## 2017-03-15 ENCOUNTER — Encounter: Payer: Self-pay | Admitting: Internal Medicine

## 2017-03-15 VITALS — BP 140/88 | HR 69 | Ht 66.0 in | Wt 208.0 lb

## 2017-03-15 DIAGNOSIS — E785 Hyperlipidemia, unspecified: Secondary | ICD-10-CM

## 2017-03-15 DIAGNOSIS — E669 Obesity, unspecified: Secondary | ICD-10-CM | POA: Diagnosis not present

## 2017-03-15 DIAGNOSIS — R739 Hyperglycemia, unspecified: Secondary | ICD-10-CM

## 2017-03-15 DIAGNOSIS — F411 Generalized anxiety disorder: Secondary | ICD-10-CM

## 2017-03-15 DIAGNOSIS — E039 Hypothyroidism, unspecified: Secondary | ICD-10-CM

## 2017-03-15 MED ORDER — PHENTERMINE HCL 37.5 MG PO CAPS: 37.5000 mg | ORAL_CAPSULE | ORAL | 1 refills | Status: DC

## 2017-03-15 NOTE — Assessment & Plan Note (Signed)
stable overall by history and exam, recent data reviewed with pt, and pt to continue medical treatment as before,  to f/u any worsening symptoms or concerns Lab Results  Component Value Date   HGBA1C 6.2 12/17/2016   

## 2017-03-15 NOTE — Assessment & Plan Note (Signed)
Ok for phentermine asd,  to f/u any worsening symptoms or concerns °

## 2017-03-15 NOTE — Progress Notes (Signed)
Subjective:    Patient ID: Mary Ward, female    DOB: June 01, 1946, 71 y.o.   MRN: [id number]  HPI    Here to f/u, has had polyphagia and increased appetite for 6 mo, Overall has lost wt, but believes her thyuroid is "off." Wt Readings from Last 3 Encounters:  03/15/17 208 lb (94.3 kg)  12/23/16 206 lb (93.4 kg)  12/23/15 216 lb (98 kg)  states "i just cant the wt off".  Asks for phentermine Lab Results  Component Value Date   TSH 0.37 02/14/2017  Still working full time, having difficulty adjusting to getting older she states.  Did have a "shadow" effect" with looking at a computer last wk, but did see eye MD with good exam, and asked to simply adjust her computer monitor for her eye function.Pt denies chest pain, increased sob or doe, wheezing, orthopnea, PND, increased LE swelling, palpitations, dizziness or syncope.   Pt denies polydipsia, polyuria Past Medical History:  Diagnosis Date  . Allergy   . Anxiety    no per pt  . Cataract    per pt "small one starting", no need for treatment at this time  . EN (erythema nodosum)   . GERD (gastroesophageal reflux disease)   . Hyperlipidemia   . Hyperplastic colonic polyp   . Hypertension   . Hypothyroidism   . Obesity    Past Surgical History:  Procedure Laterality Date  . BREAST REDUCTION SURGERY  2002  . BUNIONECTOMY     both feet  . COLONOSCOPY    . CYST EXCISION  12/15/12   punch biopsy left shoulder blade  . HAMMER TOE SURGERY     right 2nd toe  . LAPAROSCOPIC ASSISTED VAGINAL HYSTERECTOMY  1992   with BSO  . OOPHORECTOMY    . TONSILLECTOMY      reports that she has never smoked. She has never used smokeless tobacco. She reports that she drinks about 0.5 oz of alcohol per week . She reports that she does not use drugs. family history includes Cancer in her father; Diabetes in her sister; Heart attack in her mother; Heart disease in her sister; Hyperlipidemia in her mother; Hypertension in her mother and sister; Stroke  in her mother. Allergies  Allergen Reactions  . Indomethacin     Took with ASA and caused severe bloating  . Prednisone     Developed acne  . Prevnar 13 [Pneumococcal 13-Val Conj Vacc]     Arm became very sore and pt developed "fiery red knot" at site  . Tetanus Toxoid     Entire arm became swollen and turned red  . Tetracycline     "hot, red knots" below knees   Current Outpatient Prescriptions on File Prior to Visit  Medication Sig Dispense Refill  . ALPRAZolam (XANAX) 0.25 MG tablet Take 1 tablet (0.25 mg total) by mouth 2 (two) times daily as needed for anxiety. 40 tablet 0  . aspirin 81 MG tablet Take 81 mg by mouth daily.      Marland Kitchen atorvastatin (LIPITOR) 20 MG tablet Take 1 tablet (20 mg total) by mouth daily. 90 tablet 3  . diphenhydrAMINE (BENADRYL) 12.5 MG/5ML elixir Take by mouth as needed for allergies.    Noelle Penner FIBER SUPPLEMENT PO Take by mouth.    . levothyroxine (SYNTHROID, LEVOTHROID) 125 MCG tablet Take 1 tablet (125 mcg total) by mouth daily. (Patient taking differently: Take 125 mcg by mouth daily. Brand name only) 90 tablet 3  .  telmisartan-hydrochlorothiazide (MICARDIS HCT) 40-12.5 MG tablet Take 1 tablet by mouth daily. 90 tablet 3   No current facility-administered medications on file prior to visit.    Review of Systems  Constitutional: Negative for other unusual diaphoresis or sweats HENT: Negative for ear discharge or swelling Eyes: Negative for other worsening visual disturbances Respiratory: Negative for stridor or other swelling  Gastrointestinal: Negative for worsening distension or other blood Genitourinary: Negative for retention or other urinary change Musculoskeletal: Negative for other MSK pain or swelling Skin: Negative for color change or other new lesions Neurological: Negative for worsening tremors and other numbness  Psychiatric/Behavioral: Negative for worsening agitation or other fatigue All other system neg per pt    Objective:   Physical  Exam BP 140/88   Pulse 69   Ht 5\' 6"  (1.676 m)   Wt 208 lb (94.3 kg)   LMP 09/13/1994   SpO2 98%   BMI 33.57 kg/m  VS noted,  Constitutional: Pt appears in NAD HENT: Head: NCAT.  Right Ear: External ear normal.  Left Ear: External ear normal.  Eyes: . Pupils are equal, round, and reactive to light. Conjunctivae and EOM are normal Nose: without d/c or deformity Neck: Neck supple. Gross normal ROM Cardiovascular: Normal rate and regular rhythm.   Pulmonary/Chest: Effort normal and breath sounds without rales or wheezing.  Neurological: Pt is alert. At baseline orientation, motor grossly intact Skin: Skin is warm. No rashes, other new lesions, no LE edema Psychiatric: Pt behavior is normal without agitation , mild nervous No other exam findings    Assessment & Plan:

## 2017-03-15 NOTE — Assessment & Plan Note (Signed)
stable overall by history and exam, recent data reviewed with pt, and pt to continue medical treatment as before,  to f/u any worsening symptoms or concerns Lab Results  Component Value Date   TSH 0.37 02/14/2017

## 2017-03-15 NOTE — Assessment & Plan Note (Signed)
[place] for cont'd xanax prn,  to f/u any worsening symptoms or concerns

## 2017-03-15 NOTE — Assessment & Plan Note (Signed)
stable overall by history and exam, recent data reviewed with pt, and pt to continue medical treatment as before,  to f/u any worsening symptoms or concerns Lab Results  Component Value Date   LDLCALC 85 12/17/2016

## 2017-03-15 NOTE — Patient Instructions (Addendum)
Please take all new medication as prescribed - the phentermine  Please continue all other medications as before, and refills have been done if requested.  Please have the pharmacy call with any other refills you may need.  Please continue your efforts at being more active, low cholesterol diet, and weight control.  Please keep your appointments with your specialists as you may have planned  Please return in Dec 30, 2017, or sooner if needed, with Lab testing done 3-5 days before

## 2017-05-10 ENCOUNTER — Ambulatory Visit (INDEPENDENT_AMBULATORY_CARE_PROVIDER_SITE_OTHER): Payer: 59 | Admitting: Internal Medicine

## 2017-05-10 VITALS — BP 150/90 | HR 79 | Temp 98.3°F | Ht 66.0 in | Wt 201.0 lb

## 2017-05-10 DIAGNOSIS — R739 Hyperglycemia, unspecified: Secondary | ICD-10-CM | POA: Diagnosis not present

## 2017-05-10 DIAGNOSIS — R05 Cough: Secondary | ICD-10-CM

## 2017-05-10 DIAGNOSIS — R059 Cough, unspecified: Secondary | ICD-10-CM

## 2017-05-10 DIAGNOSIS — I10 Essential (primary) hypertension: Secondary | ICD-10-CM | POA: Diagnosis not present

## 2017-05-10 MED ORDER — HYDROCODONE-HOMATROPINE 5-1.5 MG/5ML PO SYRP: 5.0000 mL | ORAL_SOLUTION | Freq: Four times a day (QID) | ORAL | 0 refills | Status: AC | PRN

## 2017-05-10 MED ORDER — AZITHROMYCIN 250 MG PO TABS: ORAL_TABLET | ORAL | 1 refills | Status: DC

## 2017-05-10 NOTE — Progress Notes (Signed)
Subjective:    Patient ID: Mary Ward, female    DOB: 09-13-1946, 71 y.o.   MRN: [id number]  HPI  Here with acute onset mild to mod 2-3 days ST, HA, general weakness and malaise, with prod cough greenish sputum, but Pt denies chest pain, increased sob or doe, wheezing, orthopnea, PND, increased LE swelling, palpitations, dizziness or syncope.   Pt denies polydipsia, polyuria Past Medical History:  Diagnosis Date  . Allergy   . Anxiety    no per pt  . Cataract    per pt "small one starting", no need for treatment at this time  . EN (erythema nodosum)   . GERD (gastroesophageal reflux disease)   . Hyperlipidemia   . Hyperplastic colonic polyp   . Hypertension   . Hypothyroidism   . Obesity    Past Surgical History:  Procedure Laterality Date  . BREAST REDUCTION SURGERY  2002  . BUNIONECTOMY     both feet  . COLONOSCOPY    . CYST EXCISION  12/15/12   punch biopsy left shoulder blade  . HAMMER TOE SURGERY     right 2nd toe  . LAPAROSCOPIC ASSISTED VAGINAL HYSTERECTOMY  1992   with BSO  . OOPHORECTOMY    . TONSILLECTOMY      reports that she has never smoked. She has never used smokeless tobacco. She reports that she drinks about 0.5 oz of alcohol per week . She reports that she does not use drugs. family history includes Cancer in her father; Diabetes in her sister; Heart attack in her mother; Heart disease in her sister; Hyperlipidemia in her mother; Hypertension in her mother and sister; Stroke in her mother. Allergies  Allergen Reactions  . Indomethacin     Took with ASA and caused severe bloating  . Prednisone     Developed acne  . Prevnar 13 [Pneumococcal 13-Val Conj Vacc]     Arm became very sore and pt developed "fiery red knot" at site  . Tetanus Toxoid     Entire arm became swollen and turned red  . Tetracycline     "hot, red knots" below knees   Current Outpatient Prescriptions on File Prior to Visit  Medication Sig Dispense Refill  . ALPRAZolam (XANAX)  0.25 MG tablet Take 1 tablet (0.25 mg total) by mouth 2 (two) times daily as needed for anxiety. 40 tablet 0  . aspirin 81 MG tablet Take 81 mg by mouth daily.      Marland Kitchen atorvastatin (LIPITOR) 20 MG tablet Take 1 tablet (20 mg total) by mouth daily. 90 tablet 3  . diphenhydrAMINE (BENADRYL) 12.5 MG/5ML elixir Take by mouth as needed for allergies.    Noelle Penner FIBER SUPPLEMENT PO Take by mouth.    . levothyroxine (SYNTHROID, LEVOTHROID) 125 MCG tablet Take 1 tablet (125 mcg total) by mouth daily. (Patient taking differently: Take 125 mcg by mouth daily. Brand name only) 90 tablet 3  . phentermine 37.5 MG capsule Take 1 capsule (37.5 mg total) by mouth every morning. 30 capsule 1  . telmisartan-hydrochlorothiazide (MICARDIS HCT) 40-12.5 MG tablet Take 1 tablet by mouth daily. 90 tablet 3   No current facility-administered medications on file prior to visit.    Review of Systems  Constitutional: Negative for other unusual diaphoresis or sweats HENT: Negative for ear discharge or swelling Eyes: Negative for other worsening visual disturbances Respiratory: Negative for stridor or other swelling  Gastrointestinal: Negative for worsening distension or other blood Genitourinary: Negative for retention  or other urinary change Musculoskeletal: Negative for other MSK pain or swelling Skin: Negative for color change or other new lesions Neurological: Negative for worsening tremors and other numbness  Psychiatric/Behavioral: Negative for worsening agitation or other fatigue All other system neg per pt    Objective:   Physical Exam BP (!) 150/90   Pulse 79   Temp 98.3 F (36.8 C)   Ht 5\' 6"  (1.676 m)   Wt 201 lb (91.2 kg)   LMP 09/13/1994   SpO2 98%   BMI 32.44 kg/m  VS noted, mild ill Constitutional: Pt appears in NAD HENT: Head: NCAT.  Right Ear: External ear normal.  Left Ear: External ear normal.  Eyes: . Pupils are equal, round, and reactive to light. Conjunctivae and EOM are normal Nose:  without d/c or deformity Bilat tm's with mild erythema.  Max sinus areas non tender.  Pharynx with mild erythema, no exudate Neck: Neck supple. Gross normal ROM Cardiovascular: Normal rate and regular rhythm.   Pulmonary/Chest: Effort normal and breath sounds decreased without rales or wheezing.  Abd:  Soft, NT, ND, + BS, no organomegaly Neurological: Pt is alert. At baseline orientation, motor grossly intact Skin: Skin is warm. No rashes, other new lesions, no LE edema Psychiatric: Pt behavior is normal without agitation  No other exam findings       Assessment & Plan:

## 2017-05-10 NOTE — Patient Instructions (Signed)
Please take all new medication as prescribed - the antibiotic, and cough medicine if needed  Please continue all other medications as before, and refills have been done if requested.  Please have the pharmacy call with any other refills you may need.  Please keep your appointments with your specialists as you may have planned     

## 2017-05-11 NOTE — Assessment & Plan Note (Signed)
Mild elevated, likely reactive, o/w stable overall by history and exam, recent data reviewed with pt, and pt to continue medical treatment as before,  to f/u any worsening symptoms or concerns

## 2017-05-11 NOTE — Assessment & Plan Note (Signed)
Mild to mod, cw bornchitis vs pna, declines cxr, for antibx course,  to f/u any worsening symptoms or concerns ?

## 2017-05-11 NOTE — Assessment & Plan Note (Signed)
stable overall by history and exam, recent data reviewed with pt, and pt to continue medical treatment as before,  to f/u any worsening symptoms or concerns Lab Results  Component Value Date   HGBA1C 6.2 12/17/2016

## 2017-05-18 ENCOUNTER — Telehealth: Payer: Self-pay | Admitting: Internal Medicine

## 2017-05-18 DIAGNOSIS — R05 Cough: Secondary | ICD-10-CM

## 2017-05-18 DIAGNOSIS — R059 Cough, unspecified: Secondary | ICD-10-CM

## 2017-05-18 NOTE — Telephone Encounter (Signed)
Pt called stating her cough has resumed and has not improved. Can either a refill of azithromycin (ZITHROMAX Z-PAK) 250 MG tablet Be called in or a new medication be call in? Please advise

## 2017-05-18 NOTE — Telephone Encounter (Signed)
Pt has been informed.

## 2017-05-18 NOTE — Telephone Encounter (Signed)
Please advise 

## 2017-05-18 NOTE — Telephone Encounter (Signed)
I am confused since the prescriptioin already has 1 refill  thanks

## 2017-05-24 ENCOUNTER — Ambulatory Visit (INDEPENDENT_AMBULATORY_CARE_PROVIDER_SITE_OTHER)
Admission: RE | Admit: 2017-05-24 | Discharge: 2017-05-24 | Disposition: A | Discharge status: Home / Self Care | Payer: 59 | Source: Ambulatory Visit | Attending: Internal Medicine | Admitting: Internal Medicine

## 2017-05-24 DIAGNOSIS — R05 Cough: Secondary | ICD-10-CM | POA: Diagnosis not present

## 2017-05-24 IMAGING — DX DG CHEST 2V
2 series · 2 of 2 positions shown · non-contrast
Comparison: Chest x-ray of [DATE]

CLINICAL DATA: Persistent bronchitic symptoms despite being on
antibiotics. History of hypertension, coronary artery
calcifications, nonsmoker.

EXAM:
CHEST  2 VIEW

[chest pa]
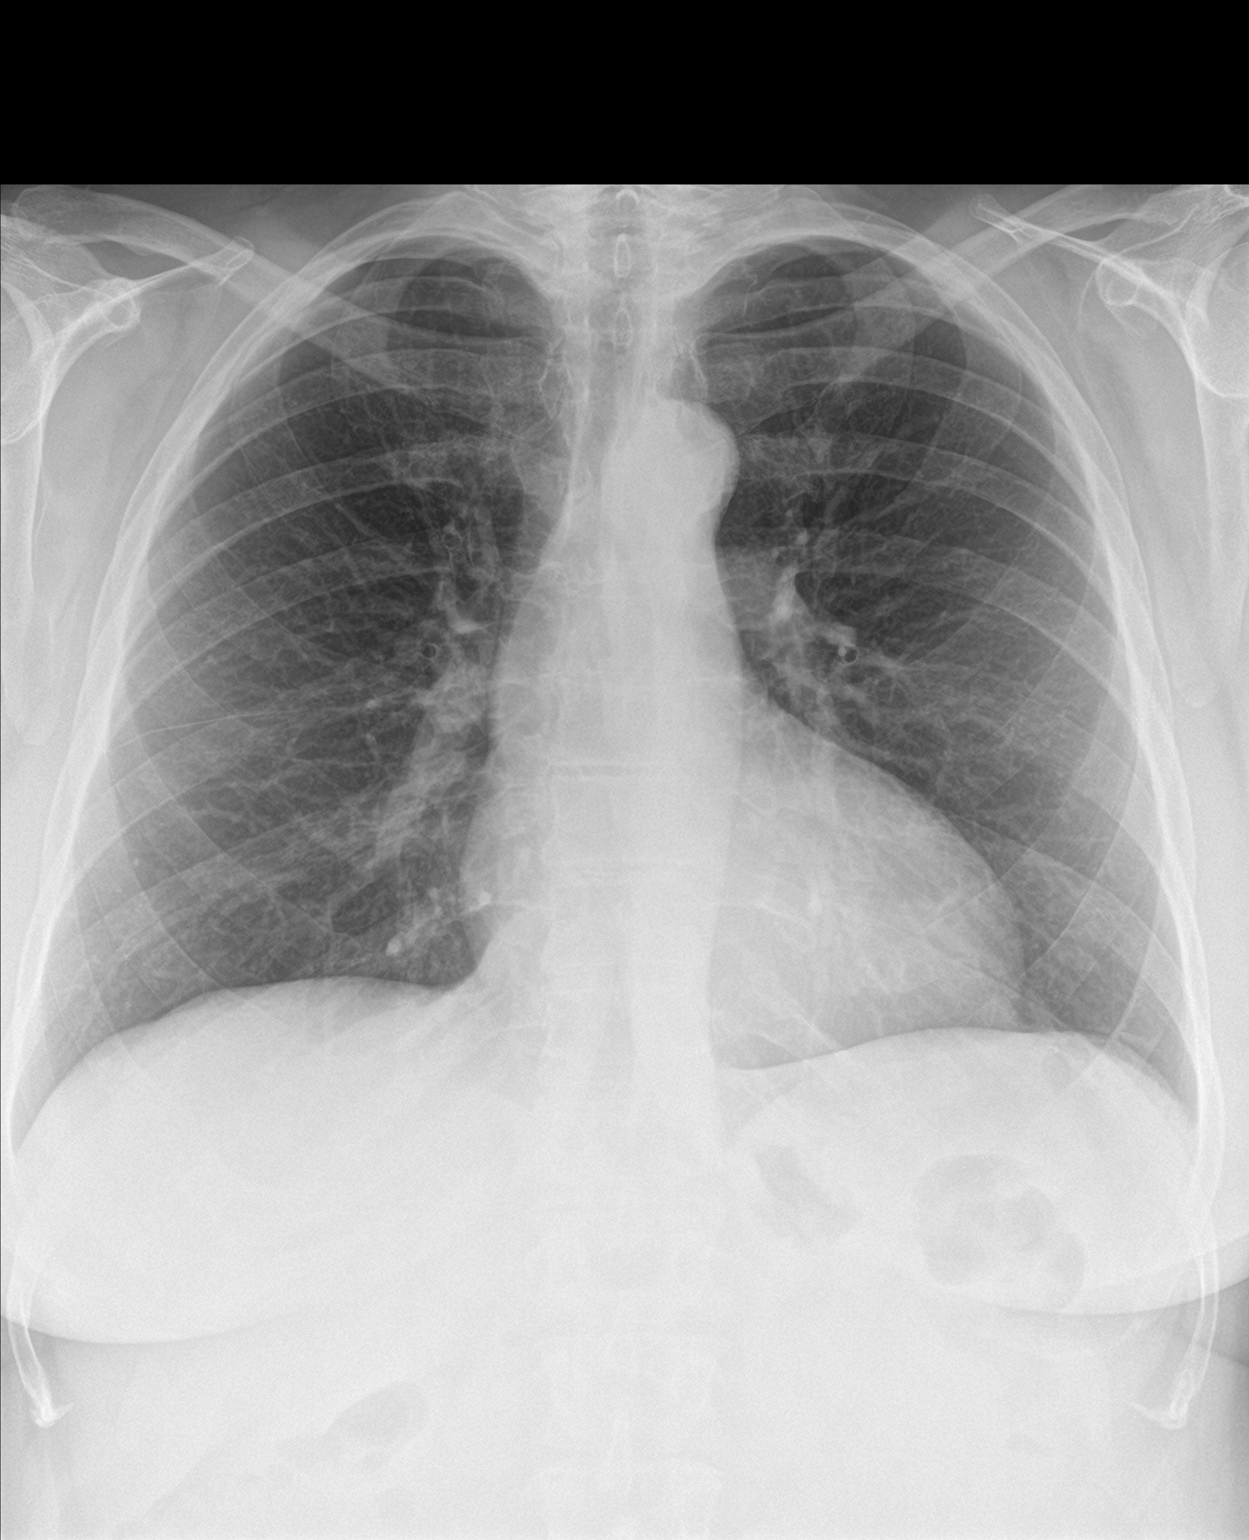

[chest lat]
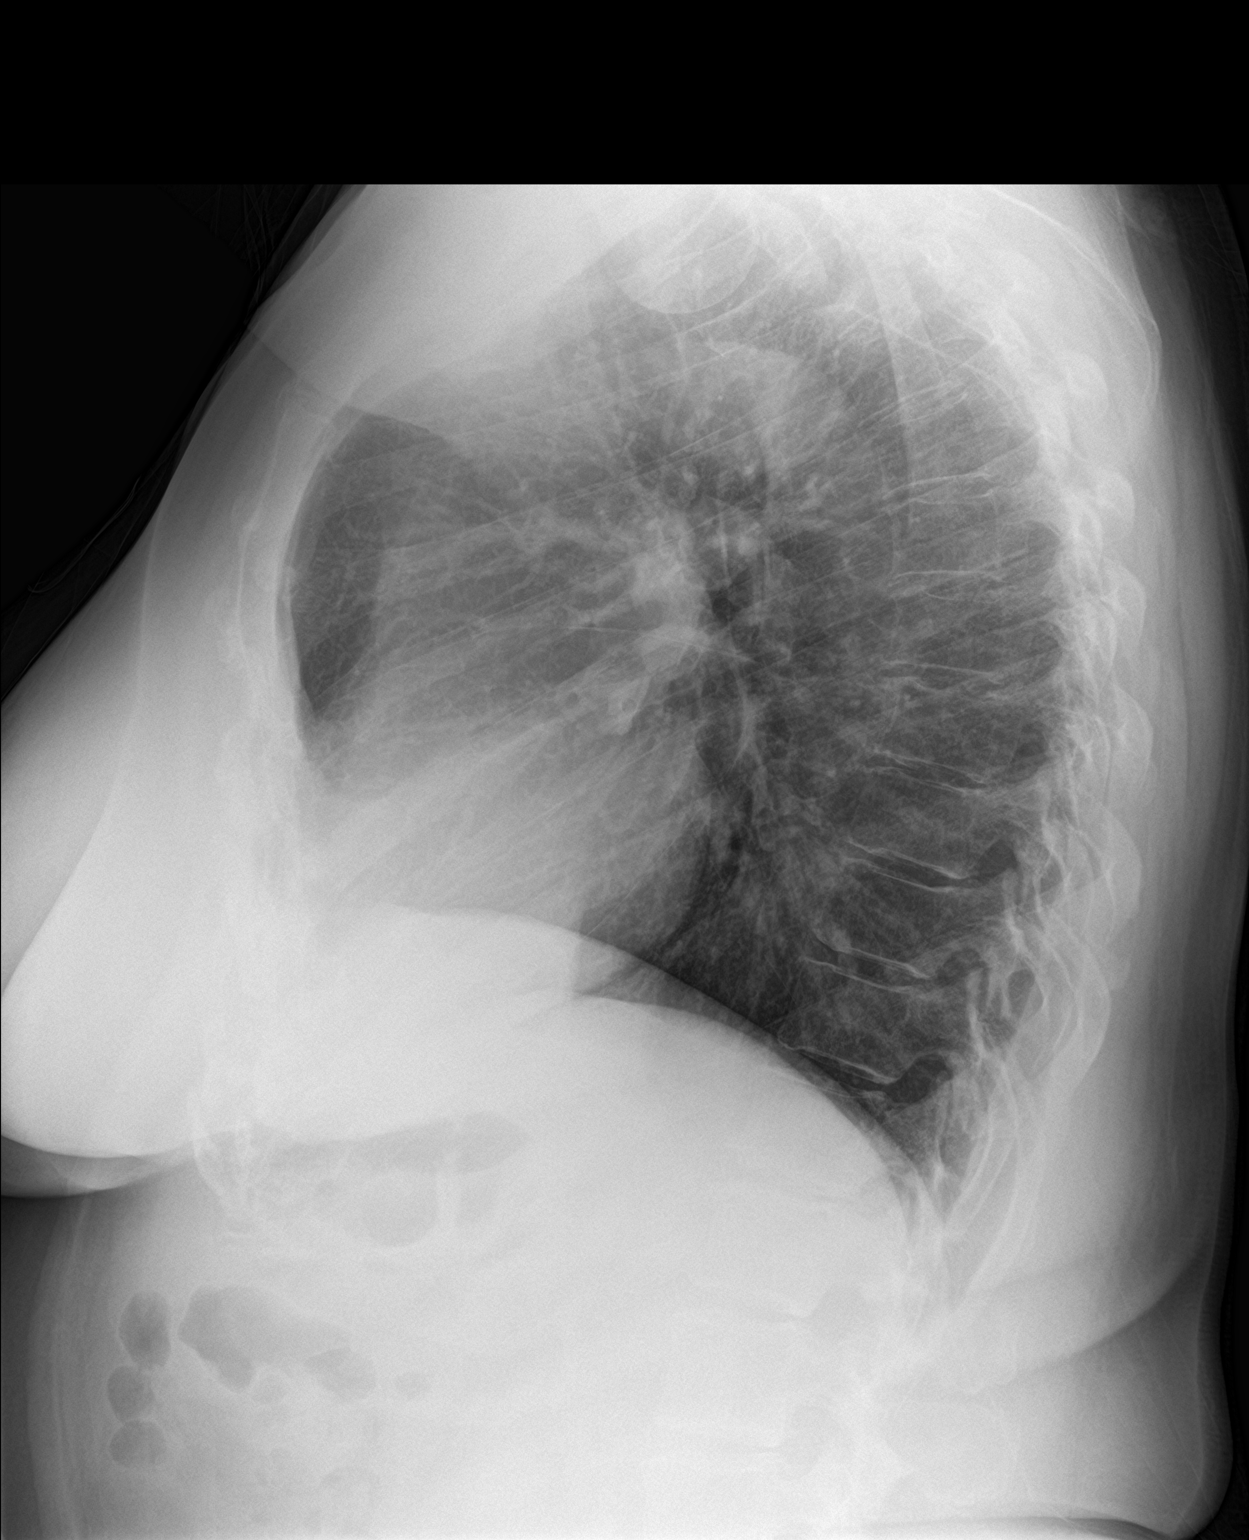

[2 of 2 positions shown; findings below may reference images not displayed]

FINDINGS: The lungs are well-expanded and clear. The heart and pulmonary
vascularity are normal. There is calcification in the wall of the
aortic arch. There is no pleural effusion. The bony thorax is
unremarkable.
IMPRESSION: There is no active cardiopulmonary disease.

Thoracic aortic atherosclerosis.

## 2017-05-24 MED ORDER — PREDNISONE 10 MG PO TABS: ORAL_TABLET | ORAL | 0 refills | Status: DC

## 2017-05-24 MED ORDER — METHYLPREDNISOLONE 4 MG PO TBPK: ORAL_TABLET | ORAL | 0 refills | Status: DC

## 2017-05-24 NOTE — Telephone Encounter (Signed)
Pt called stating that she has used the second round of antibiotics but is still having symptoms. She would like a call back when you have a chance.

## 2017-05-24 NOTE — Telephone Encounter (Signed)
Please advise 

## 2017-05-24 NOTE — Telephone Encounter (Signed)
Pt has been informed and expressed understanding.  

## 2017-05-24 NOTE — Addendum Note (Signed)
Addended by: Biagio Borg on: 05/24/2017 01:34 PM   Modules accepted: Orders

## 2017-05-24 NOTE — Telephone Encounter (Signed)
I assume pt means an intolerance to prednisone since it would be extremely unlikely to have an actual allergy to the treatment for allergic reactions  OK for medrol pak - done erx

## 2017-05-24 NOTE — Addendum Note (Signed)
Addended by: Biagio Borg on: 05/24/2017 01:20 PM   Modules accepted: Orders

## 2017-05-24 NOTE — Telephone Encounter (Signed)
Pt has been informed but she mentioned that she has an allergy to prednisone. Please advise.

## 2017-05-24 NOTE — Telephone Encounter (Signed)
[place] for cxr, and prednisone asd - I have ordered

## 2017-09-28 ENCOUNTER — Telehealth: Payer: Self-pay | Admitting: Internal Medicine

## 2017-09-28 MED ORDER — AZITHROMYCIN 250 MG PO TABS: ORAL_TABLET | ORAL | 1 refills | Status: DC

## 2017-09-28 NOTE — Telephone Encounter (Signed)
Copied from [place] [id number]. Topic: Quick Communication - See Telephone Encounter >> Sep 28, 2017  9:10 AM Ahmed Prima L wrote: CRM for notification. See Telephone encounter for:   09/28/17.  Patient said she has an upper resp issues going on. Coughing , ears burning. She said she was in there yesterday with her husband & Dr. Jenny Reichmann wouldn't see her bc it was her husbands appt. She is requesting an antibiotic. Call back [phone]

## 2017-09-28 NOTE — Telephone Encounter (Signed)
Notified pt MD sent zpac to pharmacy.Marland KitchenJohny Ward

## 2017-09-28 NOTE — Telephone Encounter (Signed)
Ok for zpack - done erx 

## 2017-10-10 DIAGNOSIS — Z1231 Encounter for screening mammogram for malignant neoplasm of breast: Secondary | ICD-10-CM | POA: Diagnosis not present

## 2017-10-10 DIAGNOSIS — Z01419 Encounter for gynecological examination (general) (routine) without abnormal findings: Secondary | ICD-10-CM | POA: Diagnosis not present

## 2017-10-10 DIAGNOSIS — Z6833 Body mass index (BMI) 33.0-33.9, adult: Secondary | ICD-10-CM | POA: Diagnosis not present

## 2017-10-20 DIAGNOSIS — H524 Presbyopia: Secondary | ICD-10-CM | POA: Diagnosis not present

## 2017-10-20 DIAGNOSIS — H10413 Chronic giant papillary conjunctivitis, bilateral: Secondary | ICD-10-CM | POA: Diagnosis not present

## 2017-10-20 DIAGNOSIS — H5203 Hypermetropia, bilateral: Secondary | ICD-10-CM | POA: Diagnosis not present

## 2017-10-20 DIAGNOSIS — H2513 Age-related nuclear cataract, bilateral: Secondary | ICD-10-CM | POA: Diagnosis not present

## 2017-11-04 ENCOUNTER — Ambulatory Visit (INDEPENDENT_AMBULATORY_CARE_PROVIDER_SITE_OTHER): Payer: PPO | Admitting: Internal Medicine

## 2017-11-04 ENCOUNTER — Ambulatory Visit: Payer: Self-pay

## 2017-11-04 ENCOUNTER — Encounter: Payer: Self-pay | Admitting: Internal Medicine

## 2017-11-04 VITALS — BP 138/88 | HR 84 | Temp 98.1°F | Ht 66.0 in | Wt 208.0 lb

## 2017-11-04 DIAGNOSIS — I10 Essential (primary) hypertension: Secondary | ICD-10-CM | POA: Diagnosis not present

## 2017-11-04 DIAGNOSIS — R079 Chest pain, unspecified: Secondary | ICD-10-CM | POA: Diagnosis not present

## 2017-11-04 DIAGNOSIS — K219 Gastro-esophageal reflux disease without esophagitis: Secondary | ICD-10-CM | POA: Diagnosis not present

## 2017-11-04 DIAGNOSIS — F418 Other specified anxiety disorders: Secondary | ICD-10-CM

## 2017-11-04 DIAGNOSIS — R072 Precordial pain: Secondary | ICD-10-CM

## 2017-11-04 MED ORDER — PANTOPRAZOLE SODIUM 40 MG PO TBEC: 40.0000 mg | DELAYED_RELEASE_TABLET | Freq: Every day | ORAL | 5 refills | Status: DC

## 2017-11-04 NOTE — Patient Instructions (Signed)
Your EKG was OK today  Please take all new medication as prescribed - the protonix 40 mg daily  You will be contacted regarding the referral for: Stress Test  Please continue all other medications as before, and refills have been done if requested.  Please have the pharmacy call with any other refills you may need.  Please keep your appointments with your specialists as you may have planned

## 2017-11-04 NOTE — Telephone Encounter (Signed)
FYI

## 2017-11-04 NOTE — Progress Notes (Signed)
Subjective:    Patient ID: Mary Ward, female    DOB: [birth year], 72 y.o.   MRN: [id number]  HPI  Here with mild intermittent SSCP dull burning without radiation, sob, n/v, diaphoresis, palp or dizziness, onset this 4am, < 30 min, helped with TUMS but admits her stress and anxiety area severe at this time due to poor work environment and multiple social stressors without hope for improvement.  Admits to mild worsening depressive symptoms related, but no suicidal ideation, or panic;  Does have some hope as she plan to retire in June and at least work pressure will resolve.  Has gained about 7 lbs with stress eating, even wakes up with loose stools from sleep sometimes. Wt Readings from Last 3 Encounters:  11/04/17 208 lb (94.3 kg)  05/10/17 201 lb (91.2 kg)  03/15/17 208 lb (94.3 kg)  Last cxr sept 2018 - neg. Just cant stop worrying she may have symptomatic CAD.No other complaints or interval change Past Medical History:  Diagnosis Date  . Allergy   . Anxiety    no per pt  . Cataract    per pt "small one starting", no need for treatment at this time  . EN (erythema nodosum)   . GERD (gastroesophageal reflux disease)   . Hyperlipidemia   . Hyperplastic colonic polyp   . Hypertension   . Hypothyroidism   . Obesity    Past Surgical History:  Procedure Laterality Date  . BREAST REDUCTION SURGERY  2002  . BUNIONECTOMY     both feet  . COLONOSCOPY    . CYST EXCISION  12/15/12   punch biopsy left shoulder blade  . HAMMER TOE SURGERY     right 2nd toe  . LAPAROSCOPIC ASSISTED VAGINAL HYSTERECTOMY  1992   with BSO  . OOPHORECTOMY    . TONSILLECTOMY      reports that  has never smoked. she has never used smokeless tobacco. She reports that she drinks about 0.5 oz of alcohol per week. She reports that she does not use drugs. family history includes Cancer in her father; Diabetes in her sister; Heart attack in her mother; Heart disease in her sister; Hyperlipidemia in her mother;  Hypertension in her mother and sister; Stroke in her mother. Allergies  Allergen Reactions  . Indomethacin     Took with ASA and caused severe bloating  . Prednisone     Developed acne  . Prevnar 13 [Pneumococcal 13-Val Conj Vacc]     Arm became very sore and pt developed "fiery red knot" at site  . Tetanus Toxoid     Entire arm became swollen and turned red  . Tetracycline     "hot, red knots" below knees   Current Outpatient Medications on File Prior to Visit  Medication Sig Dispense Refill  . ALPRAZolam (XANAX) 0.25 MG tablet Take 1 tablet (0.25 mg total) by mouth 2 (two) times daily as needed for anxiety. 40 tablet 0  . aspirin 81 MG tablet Take 81 mg by mouth daily.      Marland Kitchen atorvastatin (LIPITOR) 20 MG tablet Take 1 tablet (20 mg total) by mouth daily. 90 tablet 3  . azithromycin (ZITHROMAX Z-PAK) 250 MG tablet 2 tab by mouth day 1, then 1 per day 6 tablet 1  . diphenhydrAMINE (BENADRYL) 12.5 MG/5ML elixir Take by mouth as needed for allergies.    Noelle Penner FIBER SUPPLEMENT PO Take by mouth.    . levothyroxine (SYNTHROID, LEVOTHROID) 125 MCG tablet Take  1 tablet (125 mcg total) by mouth daily. (Patient taking differently: Take 125 mcg by mouth daily. Brand name only) 90 tablet 3  . methylPREDNISolone (MEDROL DOSEPAK) 4 MG TBPK tablet 4tab by mouth x3day, 3tab x3day 21 tablet 0  . phentermine 37.5 MG capsule Take 1 capsule (37.5 mg total) by mouth every morning. 30 capsule 1  . telmisartan-hydrochlorothiazide (MICARDIS HCT) 40-12.5 MG tablet Take 1 tablet by mouth daily. 90 tablet 3   No current facility-administered medications on file prior to visit.    Review of Systems  Constitutional: Negative for other unusual diaphoresis or sweats HENT: Negative for ear discharge or swelling Eyes: Negative for other worsening visual disturbances Respiratory: Negative for stridor or other swelling  Gastrointestinal: Negative for worsening distension or other blood Genitourinary: Negative for  retention or other urinary change Musculoskeletal: Negative for other MSK pain or swelling Skin: Negative for color change or other new lesions Neurological: Negative for worsening tremors and other numbness  Psychiatric/Behavioral: Negative for worsening agitation or other fatigue All other system neg per pt    Objective:   Physical Exam BP 138/88   Pulse 84   Temp 98.1 F (36.7 C) (Oral)   Ht 5\' 6"  (1.676 m)   Wt 208 lb (94.3 kg)   LMP 09/13/1994   SpO2 97%   BMI 33.57 kg/m  VS noted,  Constitutional: Pt appears in NAD HENT: Head: NCAT.  Right Ear: External ear normal.  Left Ear: External ear normal.  Eyes: . Pupils are equal, round, and reactive to light. Conjunctivae and EOM are normal Nose: without d/c or deformity Neck: Neck supple. Gross normal ROM Cardiovascular: Normal rate and regular rhythm.   Pulmonary/Chest: Effort normal and breath sounds without rales or wheezing.  Abd:  Soft, ND, + BS, no organomegaly, mild epigastric tender Neurological: Pt is alert. At baseline orientation, motor grossly intact Skin: Skin is warm. No rashes, other new lesions, no LE edema Psychiatric: Pt behavior is normal without agitation  No other exam findings  ECG today I have personally interpreted NSR, low voltage, non specific changes     Assessment & Plan:

## 2017-11-04 NOTE — Telephone Encounter (Signed)
Patient called in with c/o "chest tightness." She says "I've had it off and on, but at 4 am this morning it was more noticeable. I got up, took some Tums and it went away. It's still fading in and out, I'm under some stress. So I thought I should call to be seen to get checked out. The discomfort is mild and doesn't radiate, just stays in the center of my chest on the breast bone." I asked is she having pain with the tightness, she denies. I asked is she having dizziness, SOB, nausea, vomiting, sweating, she said "no." According to protocol, see PCP within 24 hours, the appointment was made before transferring to [facility], appointment today at 1540 with Dr. Jenny Reichmann, care advice given, she verbalized understanding to go to the ED if symptoms worsen before her appointment.   Reason for Disposition . [1] Chest pain lasts > 5 minutes AND [2] occurred > 3 days ago (72 hours) AND [3] NO chest pain or cardiac symptoms now  Answer Assessment - Initial Assessment Questions 1. LOCATION: "Where does it hurt?"       On breast bone in the center-just tightness 2. RADIATION: "Does the pain go anywhere else?" (e.g., into neck, jaw, arms, back)     Denies 3. ONSET: "When did the chest pain begin?" (Minutes, hours or days)      This morning 4 am, fades in and out, took tums 4. PATTERN "Does the pain come and go, or has it been constant since it started?"  "Does it get worse with exertion?"      Comes and goes 5. DURATION: "How [place] does it last" (e.g., seconds, minutes, hours)     Really can't tell 6. SEVERITY: "How bad is the pain?"  (e.g., Scale 1-10; mild, moderate, or severe)    - MILD (1-3): doesn't interfere with normal activities     - MODERATE (4-7): interferes with normal activities or awakens from sleep    - SEVERE (8-10): excruciating pain, unable to do any normal activities       Mild tightness, discomfort 7. CARDIAC RISK FACTORS: "Do you have any history of heart problems or risk factors for heart disease?"  (e.g., prior heart attack, angina; high blood pressure, diabetes, being overweight, high cholesterol, smoking, or strong family history of heart disease)     Overweight, HTN, high cholesterol 8. PULMONARY RISK FACTORS: "Do you have any history of lung disease?"  (e.g., blood clots in lung, asthma, emphysema, birth control pills)     No 9. CAUSE: "What do you think is causing the chest pain?"     I hope it's indigestion coupled with stress 10. OTHER SYMPTOMS: "Do you have any other symptoms?" (e.g., dizziness, nausea, vomiting, sweating, fever, difficulty breathing, cough)       Denies 11. PREGNANCY: "Is there any chance you are pregnant?" "When was your last menstrual period?"       No  Protocols used: CHEST PAIN-A-AH

## 2017-11-05 ENCOUNTER — Encounter: Payer: Self-pay | Admitting: Internal Medicine

## 2017-11-05 DIAGNOSIS — K219 Gastro-esophageal reflux disease without esophagitis: Secondary | ICD-10-CM | POA: Insufficient documentation

## 2017-11-05 DIAGNOSIS — R079 Chest pain, unspecified: Secondary | ICD-10-CM | POA: Insufficient documentation

## 2017-11-05 NOTE — Assessment & Plan Note (Signed)
Mild to mod situational, declines SSRI and counseling referral

## 2017-11-05 NOTE — Assessment & Plan Note (Addendum)
Atypical, ecg reviewed, declines repeat cxr, cant r/o cardiac - for stress test  Note:  Total time for pt hx, exam, review of record with pt in the room, determination of diagnoses and plan for further eval and tx is > 40 min, with over 50% spent in coordination and counseling of patient including the differential dx, tx, further evaluation and other management of chest pain, anxiety and depression, HTN, and new onset GERD

## 2017-11-05 NOTE — Assessment & Plan Note (Signed)
Mild recent onset, possibly related to wt increasing and diet, for antireflux diet and protonix 40 qd

## 2017-11-05 NOTE — Assessment & Plan Note (Signed)
stable overall by history and exam, recent data reviewed with pt, and pt to continue medical treatment as before,  to f/u any worsening symptoms or concerns BP Readings from Last 3 Encounters:  11/04/17 138/88  05/10/17 (!) 150/90  03/15/17 140/88

## 2017-11-11 ENCOUNTER — Telehealth (HOSPITAL_COMMUNITY): Payer: Self-pay | Admitting: Radiology

## 2017-11-11 NOTE — Telephone Encounter (Signed)
Patient given detailed instructions per Myocardial Perfusion Study Information Sheet for the test on 11/14/2017 at 9:45. Patient notified to arrive 15 minutes early and that it is imperative to arrive on time for appointment to keep from having the test rescheduled.  If you need to cancel or reschedule your appointment, please call the office within 24 hours of your appointment. . Patient verbalized understanding.EHK

## 2017-11-14 ENCOUNTER — Ambulatory Visit (HOSPITAL_COMMUNITY): Payer: PPO | Attending: Cardiology

## 2017-11-14 DIAGNOSIS — Z8249 Family history of ischemic heart disease and other diseases of the circulatory system: Secondary | ICD-10-CM | POA: Diagnosis not present

## 2017-11-14 DIAGNOSIS — I10 Essential (primary) hypertension: Secondary | ICD-10-CM | POA: Insufficient documentation

## 2017-11-14 DIAGNOSIS — R072 Precordial pain: Secondary | ICD-10-CM | POA: Insufficient documentation

## 2017-11-14 DIAGNOSIS — E785 Hyperlipidemia, unspecified: Secondary | ICD-10-CM | POA: Diagnosis not present

## 2017-11-14 IMAGING — NM NM MISC PROCEDURE
5 series · 30 of 30 positions shown · non-contrast
Comparison: none

[Series 1: stress - gated · 6.51mm/px · 6 of 478 frames shown]
[frame 40/478  full-range]
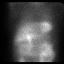
[frame 120/478  full-range]
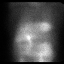
[frame 200/478  full-range]
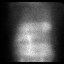
[frame 279/478  full-range]
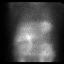
[frame 359/478  full-range]
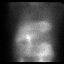
[frame 439/478  full-range]
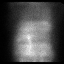

[Series 1: wbr_s-proj_st stress - gated · 6.51mm/px · 6 of 512 frames shown]
[frame 43/512]
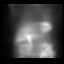
[frame 128/512]
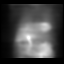
[frame 214/512]
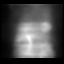
[frame 299/512]
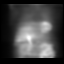
[frame 384/512]
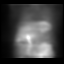
[frame 470/512]
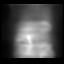

[Series 2: stress - perfusion · 6.51mm/px · 6 of 64 frames shown]
[frame 6/64]
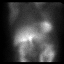
[frame 16/64]
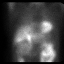
[frame 27/64]
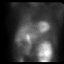
[frame 38/64]
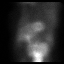
[frame 48/64]
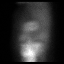
[frame 59/64]
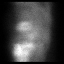

[Series 3: rest · 6.51mm/px · 6 of 64 frames shown]
[frame 6/64]
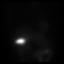
[frame 16/64]
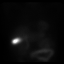
[frame 27/64]
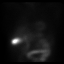
[frame 38/64]
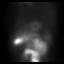
[frame 48/64]
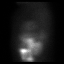
[frame 59/64]
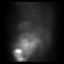

[Series 3: wbr_r-proj_st rest · 6.51mm/px · 6 of 64 frames shown]
[frame 6/64]
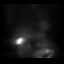
[frame 16/64]
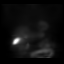
[frame 27/64]
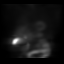
[frame 38/64]
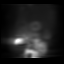
[frame 48/64]
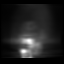
[frame 59/64]
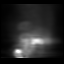

[30 of 30 positions shown; findings below may reference images not displayed]

Canned report from images found in remote index.

Refer to host system for actual result text.

## 2017-11-14 MED ORDER — TECHNETIUM TC 99M TETROFOSMIN IV KIT
33.0000 | PACK | Freq: Once | INTRAVENOUS | Status: AC | PRN
  Administered 2017-11-14: 33 via INTRAVENOUS
  Filled 2017-11-14: qty 33

## 2017-11-15 ENCOUNTER — Ambulatory Visit (HOSPITAL_COMMUNITY): Payer: PPO | Attending: Cardiology

## 2017-11-15 MED ORDER — TECHNETIUM TC 99M TETROFOSMIN IV KIT
32.0000 | PACK | Freq: Once | INTRAVENOUS | Status: AC | PRN
  Administered 2017-11-15: 32 via INTRAVENOUS
  Filled 2017-11-15: qty 32

## 2017-11-16 LAB — MYOCARDIAL PERFUSION IMAGING
CHL CUP MPHR: 150 {beats}/min
CHL CUP NUCLEAR SDS: 4
CHL CUP RESTING HR STRESS: 74 {beats}/min
CSEPHR: 97 %
CSEPPHR: 146 {beats}/min
Estimated workload: 7 METS
Exercise duration (min): 6 min
LHR: 0.35
LV dias vol: 68 mL (ref 46–106)
LV sys vol: 22 mL
NUC STRESS TID: 0.91
RPE: 17
SRS: 7
SSS: 11

## 2017-12-27 ENCOUNTER — Other Ambulatory Visit (INDEPENDENT_AMBULATORY_CARE_PROVIDER_SITE_OTHER): Payer: PPO

## 2017-12-27 DIAGNOSIS — R739 Hyperglycemia, unspecified: Secondary | ICD-10-CM | POA: Diagnosis not present

## 2017-12-27 DIAGNOSIS — Z0000 Encounter for general adult medical examination without abnormal findings: Secondary | ICD-10-CM

## 2017-12-27 LAB — URINALYSIS, ROUTINE W REFLEX MICROSCOPIC
BILIRUBIN URINE: NEGATIVE
HGB URINE DIPSTICK: NEGATIVE
KETONES UR: NEGATIVE
Leukocytes, UA: NEGATIVE
NITRITE: NEGATIVE
Specific Gravity, Urine: 1.02 (ref 1.000–1.030)
TOTAL PROTEIN, URINE-UPE24: NEGATIVE
Urine Glucose: NEGATIVE
Urobilinogen, UA: 0.2 (ref 0.0–1.0)
pH: 6 (ref 5.0–8.0)

## 2017-12-27 LAB — BASIC METABOLIC PANEL
BUN: 15 mg/dL (ref 6–23)
CHLORIDE: 105 meq/L (ref 96–112)
CO2: 28 meq/L (ref 19–32)
Calcium: 9.2 mg/dL (ref 8.4–10.5)
Creatinine, Ser: 0.77 mg/dL (ref 0.40–1.20)
GFR: 78.67 mL/min (ref >60.00)
Glucose, Bld: 113 mg/dL — ABNORMAL HIGH (ref 70–99)
POTASSIUM: 4.2 meq/L (ref 3.5–5.1)
Sodium: 140 mEq/L (ref 135–145)

## 2017-12-27 LAB — CBC WITH DIFFERENTIAL/PLATELET
BASOS ABS: 0.1 10*3/uL (ref 0.0–0.1)
BASOS PCT: 0.7 % (ref 0.0–3.0)
EOS ABS: 0.1 10*3/uL (ref 0.0–0.7)
Eosinophils Relative: 1.9 % (ref 0.0–5.0)
HCT: 42.2 % (ref 36.0–46.0)
Hemoglobin: 14.1 g/dL (ref 12.0–15.0)
LYMPHS ABS: 2.5 10*3/uL (ref 0.7–4.0)
LYMPHS PCT: 32.7 % (ref 12.0–46.0)
MCHC: 33.4 g/dL (ref 30.0–36.0)
MCV: 84 fl (ref 78.0–100.0)
Monocytes Absolute: 0.6 10*3/uL (ref 0.1–1.0)
Monocytes Relative: 8 % (ref 3.0–12.0)
NEUTROS ABS: 4.3 10*3/uL (ref 1.4–7.7)
NEUTROS PCT: 56.7 % (ref 43.0–77.0)
PLATELETS: 157 10*3/uL (ref 150.0–400.0)
RBC: 5.02 Mil/uL (ref 3.87–5.11)
RDW: 13.7 % (ref 11.5–15.5)
WBC: 7.5 10*3/uL (ref 4.0–10.5)

## 2017-12-27 LAB — HEPATIC FUNCTION PANEL
ALK PHOS: 78 U/L (ref 39–117)
ALT: 21 U/L (ref 0–35)
AST: 16 U/L (ref 0–37)
Albumin: 3.9 g/dL (ref 3.5–5.2)
BILIRUBIN DIRECT: 0.2 mg/dL (ref 0.0–0.3)
TOTAL PROTEIN: 6.7 g/dL (ref 6.0–8.3)
Total Bilirubin: 0.5 mg/dL (ref 0.2–1.2)

## 2017-12-27 LAB — LIPID PANEL
CHOL/HDL RATIO: 3
Cholesterol: 133 mg/dL (ref 0–200)
HDL: 44.5 mg/dL (ref >39.00)
LDL Cholesterol: 71 mg/dL (ref 0–99)
NONHDL: 88.2
TRIGLYCERIDES: 84 mg/dL (ref 0.0–149.0)
VLDL: 16.8 mg/dL (ref 0.0–40.0)

## 2017-12-27 LAB — TSH: TSH: 0.68 u[IU]/mL (ref 0.35–4.50)

## 2017-12-27 LAB — HEMOGLOBIN A1C: Hgb A1c MFr Bld: 6.2 % (ref 4.6–6.5)

## 2017-12-30 ENCOUNTER — Encounter: Payer: 59 | Admitting: Internal Medicine

## 2018-01-03 ENCOUNTER — Encounter: Payer: Self-pay | Admitting: Internal Medicine

## 2018-01-03 ENCOUNTER — Telehealth: Payer: Self-pay

## 2018-01-03 ENCOUNTER — Ambulatory Visit (INDEPENDENT_AMBULATORY_CARE_PROVIDER_SITE_OTHER): Payer: PPO | Admitting: Internal Medicine

## 2018-01-03 VITALS — BP 116/78 | HR 62 | Temp 97.7°F | Ht 66.0 in | Wt 211.0 lb

## 2018-01-03 DIAGNOSIS — Z0000 Encounter for general adult medical examination without abnormal findings: Secondary | ICD-10-CM

## 2018-01-03 DIAGNOSIS — R739 Hyperglycemia, unspecified: Secondary | ICD-10-CM

## 2018-01-03 MED ORDER — ATORVASTATIN CALCIUM 20 MG PO TABS: 20.0000 mg | ORAL_TABLET | Freq: Every day | ORAL | 3 refills | Status: DC

## 2018-01-03 MED ORDER — PANTOPRAZOLE SODIUM 40 MG PO TBEC: 40.0000 mg | DELAYED_RELEASE_TABLET | Freq: Every day | ORAL | 3 refills | Status: DC

## 2018-01-03 MED ORDER — TELMISARTAN-HCTZ 40-12.5 MG PO TABS: 1.0000 | ORAL_TABLET | Freq: Every day | ORAL | 3 refills | Status: DC

## 2018-01-03 MED ORDER — LEVOTHYROXINE SODIUM 125 MCG PO TABS: 125.0000 ug | ORAL_TABLET | Freq: Every day | ORAL | 3 refills | Status: DC

## 2018-01-03 MED ORDER — ALPRAZOLAM 0.25 MG PO TABS: 0.2500 mg | ORAL_TABLET | Freq: Two times a day (BID) | ORAL | 0 refills | Status: DC | PRN

## 2018-01-03 NOTE — Telephone Encounter (Signed)
Please advise.  Copied from [place] [phone]. Topic: Inquiry >> [name] 03, 2018  9:05 AM Pricilla Handler wrote: Reason for CRM: Patient called wanting to speak with Dr. Gwynn Burly nurse. Patient wants to know if she can take a Tumeric supplement with her other prescription medications. Please call patient at [phone].         Thank You!!!

## 2018-01-03 NOTE — Assessment & Plan Note (Signed)
Mild, for wt control,  to f/u any worsening symptoms or concerns

## 2018-01-03 NOTE — Patient Instructions (Signed)
Please continue all other medications as before, and refills have been done if requested.  Please have the pharmacy call with any other refills you may need.  Please continue your efforts at being more active, low cholesterol diet, and weight control.  You are otherwise up to date with prevention measures today.  Please keep your appointments with your specialists as you may have planned  Please return in 1 year for your yearly visit, or sooner if needed, with Lab testing done 3-5 days before  

## 2018-01-03 NOTE — Telephone Encounter (Signed)
Ok for [employer], thanks

## 2018-01-03 NOTE — Progress Notes (Signed)
Subjective:    Patient ID: Mary Ward, female    DOB: [birth year], 72 y.o.   MRN: [id number]  HPI  Here for wellness and f/u;  Overall doing ok;  Pt denies Chest pain, worsening SOB, DOE, wheezing, orthopnea, PND, worsening LE edema, palpitations, dizziness or syncope.  Pt denies neurological change such as new headache, facial or extremity weakness.  Pt denies polydipsia, polyuria, or low sugar symptoms. Pt states overall good compliance with treatment and medications, good tolerability, and has been trying to follow appropriate diet.  Pt denies worsening depressive symptoms, suicidal ideation or panic. No fever, night sweats, wt loss, loss of appetite, or other constitutional symptoms.  Pt states good ability with ADL's, has low fall risk, home safety reviewed and adequate, no other significant changes in hearing or vision, and only occasionally active with exercise.  Retiring June 28,2019.  Recalls she had the flu shot last year oct 2018 with one wk of injection site LUE pain which still somewhat persists to this day, hard to abduct or forward elevated,   Has some mild plantar fasciitis pain in the am to both heels for several months, but alleve helps.No other new complaint or interval hx. She conts to care for her husband with major depressoin now better on symbyax Past Medical History:  Diagnosis Date  . Allergy   . Anxiety    no per pt  . Cataract    per pt "small one starting", no need for treatment at this time  . EN (erythema nodosum)   . GERD (gastroesophageal reflux disease)   . Hyperlipidemia   . Hyperplastic colonic polyp   . Hypertension   . Hypothyroidism   . Obesity    Past Surgical History:  Procedure Laterality Date  . BREAST REDUCTION SURGERY  2002  . BUNIONECTOMY     both feet  . COLONOSCOPY    . CYST EXCISION  12/15/12   punch biopsy left shoulder blade  . HAMMER TOE SURGERY     right 2nd toe  . LAPAROSCOPIC ASSISTED VAGINAL HYSTERECTOMY  1992   with BSO  .  OOPHORECTOMY    . TONSILLECTOMY      reports that she has never smoked. She has never used smokeless tobacco. She reports that she drinks about 0.5 oz of alcohol per week. She reports that she does not use drugs. family history includes Cancer in her father; Diabetes in her sister; Heart attack in her mother; Heart disease in her sister; Hyperlipidemia in her mother; Hypertension in her mother and sister; Stroke in her mother. Allergies  Allergen Reactions  . Indomethacin     Took with ASA and caused severe bloating  . Prednisone     Developed acne  . Prevnar 13 [Pneumococcal 13-Val Conj Vacc]     Arm became very sore and pt developed "fiery red knot" at site  . Tetanus Toxoid     Entire arm became swollen and turned red  . Tetracycline     "hot, red knots" below knees   Current Outpatient Medications on File Prior to Visit  Medication Sig Dispense Refill  . aspirin 81 MG tablet Take 81 mg by mouth daily.      . diphenhydrAMINE (BENADRYL) 12.5 MG/5ML elixir Take by mouth as needed for allergies.    Noelle Penner FIBER SUPPLEMENT PO Take by mouth.    . phentermine 37.5 MG capsule Take 1 capsule (37.5 mg total) by mouth every morning. 30 capsule 1   No  current facility-administered medications on file prior to visit.    Review of Systems Constitutional: Negative for other unusual diaphoresis, sweats, appetite or weight changes HENT: Negative for other worsening hearing loss, ear pain, facial swelling, mouth sores or neck stiffness.   Eyes: Negative for other worsening pain, redness or other visual disturbance.  Respiratory: Negative for other stridor or swelling Cardiovascular: Negative for other palpitations or other chest pain  Gastrointestinal: Negative for worsening diarrhea or loose stools, blood in stool, distention or other pain Genitourinary: Negative for hematuria, flank pain or other change in urine volume.  Musculoskeletal: Negative for myalgias or other joint swelling.  Skin:  Negative for other color change, or other wound or worsening drainage.  Neurological: Negative for other syncope or numbness. Hematological: Negative for other adenopathy or swelling Psychiatric/Behavioral: Negative for hallucinations, other worsening agitation, SI, self-injury, or new decreased concentration All other system neg per pt    Objective:   Physical Exam BP 116/78   Pulse 62   Temp 97.7 F (36.5 C) (Oral)   Ht 5\' 6"  (1.676 m)   Wt 211 lb (95.7 kg)   LMP 09/13/1994   SpO2 96%   BMI 34.06 kg/m  VS noted,  Constitutional: Pt is oriented to person, place, and time. Appears well-developed and well-nourished, in no significant distress and comfortable Head: Normocephalic and atraumatic  Eyes: Conjunctivae and EOM are normal. Pupils are equal, round, and reactive to light Right Ear: External ear normal without discharge Left Ear: External ear normal without discharge Nose: Nose without discharge or deformity Mouth/Throat: Oropharynx is without other ulcerations and moist  Neck: Normal range of motion. Neck supple. No JVD present. No tracheal deviation present or significant neck LA or mass Cardiovascular: Normal rate, regular rhythm, normal heart sounds and intact distal pulses.   Pulmonary/Chest: WOB normal and breath sounds without rales or wheezing  Abdominal: Soft. Bowel sounds are normal. NT. No HSM  Musculoskeletal: Normal range of motion. Exhibits no edema Lymphadenopathy: Has no other cervical adenopathy.  Neurological: Pt is alert and oriented to person, place, and time. Pt has normal reflexes. No cranial nerve deficit. Motor grossly intact, Gait intact Skin: Skin is warm and dry. No rash noted or new ulcerations Psychiatric:  Has normal mood and affect. Behavior is normal without agitation No other exam findings Lab Results  Component Value Date   WBC 7.5 12/27/2017   HGB 14.1 12/27/2017   HCT 42.2 12/27/2017   PLT 157.0 12/27/2017   GLUCOSE 113 (H) 12/27/2017    CHOL 133 12/27/2017   TRIG 84.0 12/27/2017   HDL 44.50 12/27/2017   LDLCALC 71 12/27/2017   ALT 21 12/27/2017   AST 16 12/27/2017   NA 140 12/27/2017   K 4.2 12/27/2017   CL 105 12/27/2017   CREATININE 0.77 12/27/2017   BUN 15 12/27/2017   CO2 28 12/27/2017   TSH 0.68 12/27/2017   HGBA1C 6.2 12/27/2017       Assessment & Plan:

## 2018-01-03 NOTE — Assessment & Plan Note (Signed)

## 2018-01-03 NOTE — Telephone Encounter (Signed)
Pt has been informed and expressed understanding.  

## 2018-04-04 DIAGNOSIS — M722 Plantar fascial fibromatosis: Secondary | ICD-10-CM | POA: Diagnosis not present

## 2018-04-14 DIAGNOSIS — M722 Plantar fascial fibromatosis: Secondary | ICD-10-CM | POA: Diagnosis not present

## 2018-04-17 DIAGNOSIS — M722 Plantar fascial fibromatosis: Secondary | ICD-10-CM | POA: Diagnosis not present

## 2018-04-20 DIAGNOSIS — M722 Plantar fascial fibromatosis: Secondary | ICD-10-CM | POA: Diagnosis not present

## 2018-04-24 DIAGNOSIS — M722 Plantar fascial fibromatosis: Secondary | ICD-10-CM | POA: Diagnosis not present

## 2018-04-27 DIAGNOSIS — M722 Plantar fascial fibromatosis: Secondary | ICD-10-CM | POA: Diagnosis not present

## 2018-05-01 DIAGNOSIS — M722 Plantar fascial fibromatosis: Secondary | ICD-10-CM | POA: Diagnosis not present

## 2018-05-02 DIAGNOSIS — M722 Plantar fascial fibromatosis: Secondary | ICD-10-CM | POA: Diagnosis not present

## 2018-05-04 DIAGNOSIS — M722 Plantar fascial fibromatosis: Secondary | ICD-10-CM | POA: Diagnosis not present

## 2018-05-08 DIAGNOSIS — M722 Plantar fascial fibromatosis: Secondary | ICD-10-CM | POA: Diagnosis not present

## 2018-05-11 DIAGNOSIS — M722 Plantar fascial fibromatosis: Secondary | ICD-10-CM | POA: Diagnosis not present

## 2018-05-18 DIAGNOSIS — M722 Plantar fascial fibromatosis: Secondary | ICD-10-CM | POA: Diagnosis not present

## 2018-05-22 DIAGNOSIS — M722 Plantar fascial fibromatosis: Secondary | ICD-10-CM | POA: Diagnosis not present

## 2018-05-24 DIAGNOSIS — M722 Plantar fascial fibromatosis: Secondary | ICD-10-CM | POA: Diagnosis not present

## 2018-05-25 DIAGNOSIS — M722 Plantar fascial fibromatosis: Secondary | ICD-10-CM | POA: Diagnosis not present

## 2018-06-05 DIAGNOSIS — M722 Plantar fascial fibromatosis: Secondary | ICD-10-CM | POA: Diagnosis not present

## 2018-06-07 DIAGNOSIS — D1801 Hemangioma of skin and subcutaneous tissue: Secondary | ICD-10-CM | POA: Diagnosis not present

## 2018-06-07 DIAGNOSIS — L814 Other melanin hyperpigmentation: Secondary | ICD-10-CM | POA: Diagnosis not present

## 2018-06-07 DIAGNOSIS — L820 Inflamed seborrheic keratosis: Secondary | ICD-10-CM | POA: Diagnosis not present

## 2018-06-07 DIAGNOSIS — L821 Other seborrheic keratosis: Secondary | ICD-10-CM | POA: Diagnosis not present

## 2018-06-07 DIAGNOSIS — D225 Melanocytic nevi of trunk: Secondary | ICD-10-CM | POA: Diagnosis not present

## 2018-06-08 DIAGNOSIS — M722 Plantar fascial fibromatosis: Secondary | ICD-10-CM | POA: Diagnosis not present

## 2018-06-12 DIAGNOSIS — M722 Plantar fascial fibromatosis: Secondary | ICD-10-CM | POA: Diagnosis not present

## 2018-06-15 DIAGNOSIS — M722 Plantar fascial fibromatosis: Secondary | ICD-10-CM | POA: Diagnosis not present

## 2018-06-19 DIAGNOSIS — M722 Plantar fascial fibromatosis: Secondary | ICD-10-CM | POA: Diagnosis not present

## 2018-06-22 DIAGNOSIS — M722 Plantar fascial fibromatosis: Secondary | ICD-10-CM | POA: Diagnosis not present

## 2018-06-23 ENCOUNTER — Ambulatory Visit (INDEPENDENT_AMBULATORY_CARE_PROVIDER_SITE_OTHER): Payer: PPO

## 2018-06-23 DIAGNOSIS — Z23 Encounter for immunization: Secondary | ICD-10-CM

## 2018-06-26 DIAGNOSIS — M722 Plantar fascial fibromatosis: Secondary | ICD-10-CM | POA: Diagnosis not present

## 2018-06-29 DIAGNOSIS — M722 Plantar fascial fibromatosis: Secondary | ICD-10-CM | POA: Diagnosis not present

## 2018-07-03 DIAGNOSIS — M722 Plantar fascial fibromatosis: Secondary | ICD-10-CM | POA: Diagnosis not present

## 2018-07-19 DIAGNOSIS — M62830 Muscle spasm of back: Secondary | ICD-10-CM | POA: Diagnosis not present

## 2018-07-19 DIAGNOSIS — M9903 Segmental and somatic dysfunction of lumbar region: Secondary | ICD-10-CM | POA: Diagnosis not present

## 2018-07-19 DIAGNOSIS — M545 Low back pain: Secondary | ICD-10-CM | POA: Diagnosis not present

## 2018-07-19 DIAGNOSIS — M9904 Segmental and somatic dysfunction of sacral region: Secondary | ICD-10-CM | POA: Diagnosis not present

## 2018-09-27 ENCOUNTER — Other Ambulatory Visit: Payer: Self-pay | Admitting: Internal Medicine

## 2018-09-28 DIAGNOSIS — M62830 Muscle spasm of back: Secondary | ICD-10-CM | POA: Diagnosis not present

## 2018-09-28 DIAGNOSIS — M545 Low back pain: Secondary | ICD-10-CM | POA: Diagnosis not present

## 2018-09-28 DIAGNOSIS — M9903 Segmental and somatic dysfunction of lumbar region: Secondary | ICD-10-CM | POA: Diagnosis not present

## 2018-09-28 DIAGNOSIS — M9904 Segmental and somatic dysfunction of sacral region: Secondary | ICD-10-CM | POA: Diagnosis not present

## 2018-10-02 DIAGNOSIS — M9904 Segmental and somatic dysfunction of sacral region: Secondary | ICD-10-CM | POA: Diagnosis not present

## 2018-10-02 DIAGNOSIS — M62830 Muscle spasm of back: Secondary | ICD-10-CM | POA: Diagnosis not present

## 2018-10-02 DIAGNOSIS — M9903 Segmental and somatic dysfunction of lumbar region: Secondary | ICD-10-CM | POA: Diagnosis not present

## 2018-10-02 DIAGNOSIS — M545 Low back pain: Secondary | ICD-10-CM | POA: Diagnosis not present

## 2018-10-03 DIAGNOSIS — M545 Low back pain: Secondary | ICD-10-CM | POA: Diagnosis not present

## 2018-10-03 DIAGNOSIS — M62830 Muscle spasm of back: Secondary | ICD-10-CM | POA: Diagnosis not present

## 2018-10-03 DIAGNOSIS — M9904 Segmental and somatic dysfunction of sacral region: Secondary | ICD-10-CM | POA: Diagnosis not present

## 2018-10-03 DIAGNOSIS — M9903 Segmental and somatic dysfunction of lumbar region: Secondary | ICD-10-CM | POA: Diagnosis not present

## 2018-10-18 DIAGNOSIS — Z1231 Encounter for screening mammogram for malignant neoplasm of breast: Secondary | ICD-10-CM | POA: Diagnosis not present

## 2018-10-18 DIAGNOSIS — Z6836 Body mass index (BMI) 36.0-36.9, adult: Secondary | ICD-10-CM | POA: Diagnosis not present

## 2018-10-18 DIAGNOSIS — Z01419 Encounter for gynecological examination (general) (routine) without abnormal findings: Secondary | ICD-10-CM | POA: Diagnosis not present

## 2018-10-19 ENCOUNTER — Other Ambulatory Visit: Payer: Self-pay | Admitting: Obstetrics and Gynecology

## 2018-10-19 DIAGNOSIS — R928 Other abnormal and inconclusive findings on diagnostic imaging of breast: Secondary | ICD-10-CM

## 2018-10-25 ENCOUNTER — Ambulatory Visit
Admission: RE | Admit: 2018-10-25 | Discharge: 2018-10-25 | Disposition: A | Discharge status: Home / Self Care | Payer: PPO | Source: Ambulatory Visit | Attending: Obstetrics and Gynecology | Admitting: Obstetrics and Gynecology

## 2018-10-25 ENCOUNTER — Ambulatory Visit: Payer: PPO

## 2018-10-25 DIAGNOSIS — R928 Other abnormal and inconclusive findings on diagnostic imaging of breast: Secondary | ICD-10-CM | POA: Diagnosis not present

## 2018-10-25 IMAGING — MG DIGITAL DIAGNOSTIC UNILATERAL LEFT MAMMOGRAM WITH TOMO AND CAD
2 series · 2 of 10 positions shown · non-contrast
Comparison: Previous exam(s).

CLINICAL DATA: Patient recalled from screening for left breast
asymmetry.

EXAM:
DIGITAL DIAGNOSTIC UNILATERAL LEFT MAMMOGRAM WITH CAD AND TOMO

[L ML tomo · tomo slice 52/103.0]
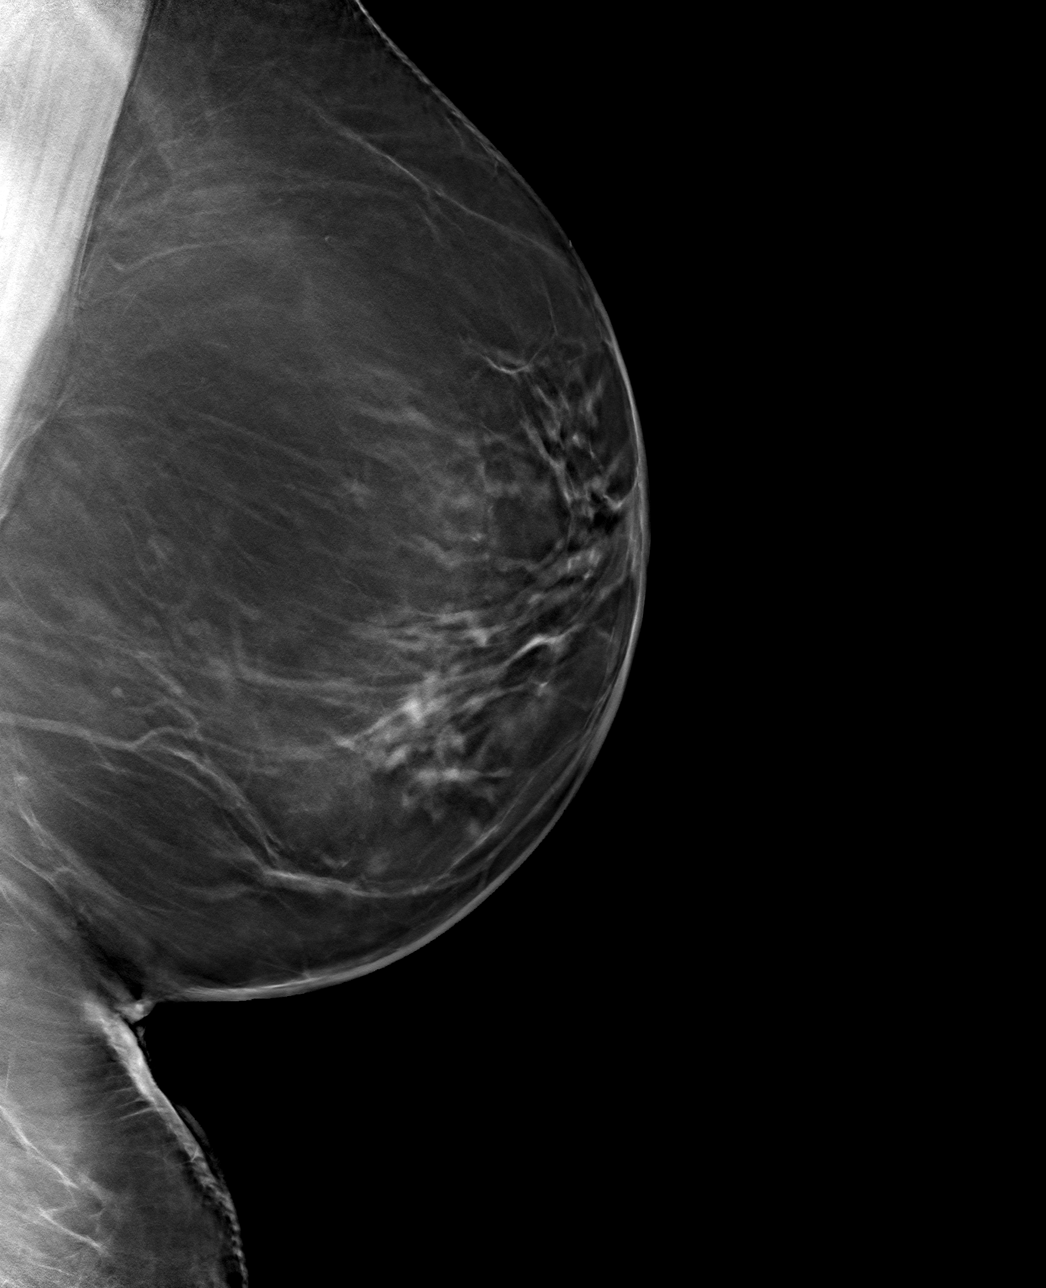

[L CC tomo · tomo slice 45/88.0]
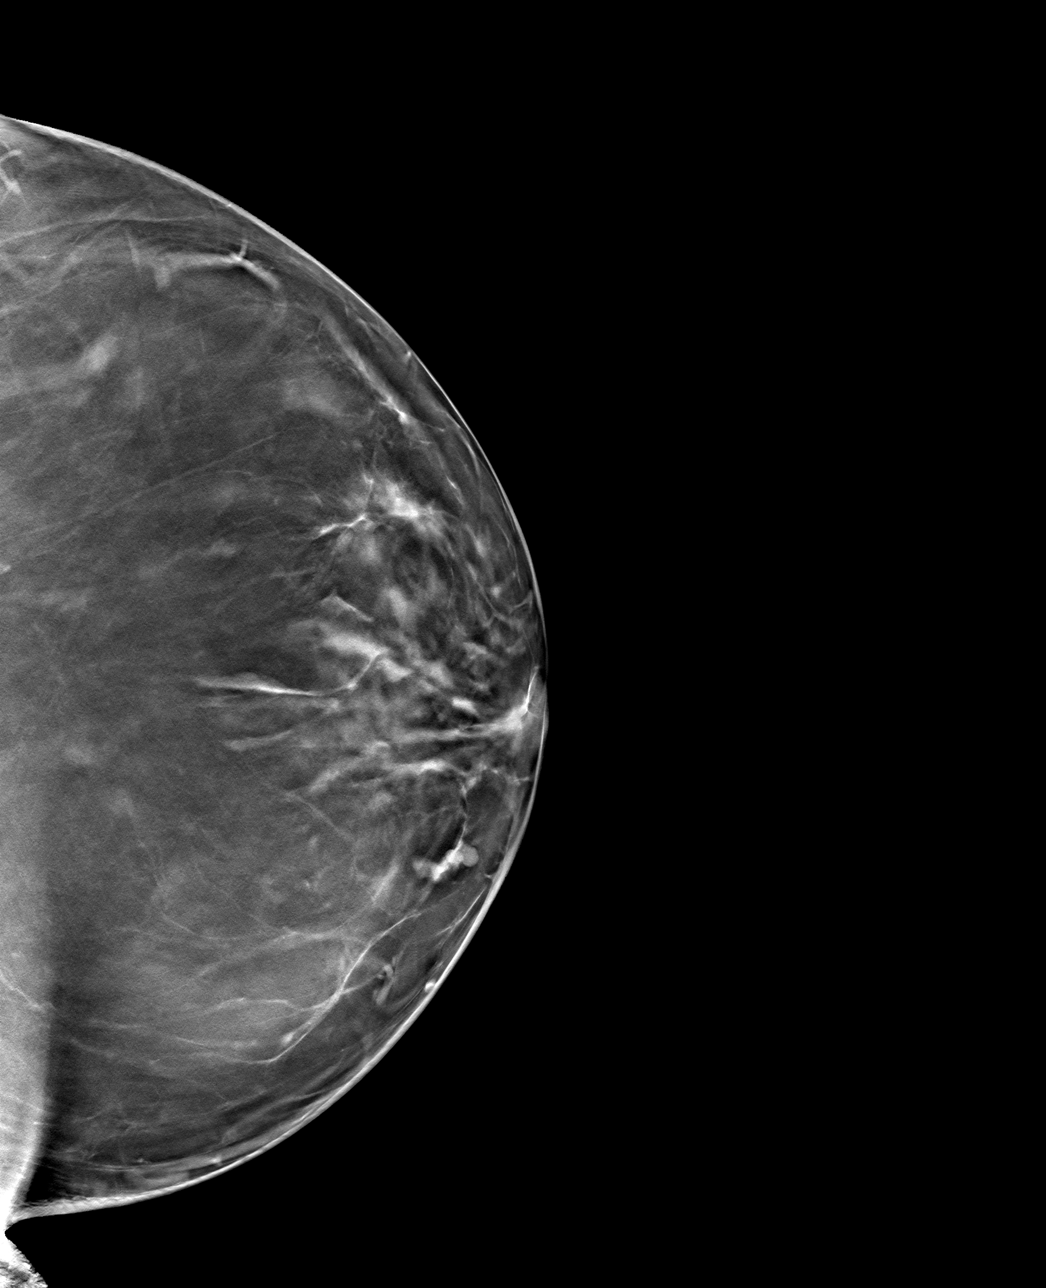

[2 of 10 positions shown; findings below may reference images not displayed]

ACR Breast Density Category b: There are scattered areas of
fibroglandular density.
FINDINGS: Asymmetry within the outer portion of the left breast resolved with
additional imaging compatible with dense fibroglandular tissue. No
suspicious masses, calcifications or distortion identified within
the left breast.

Mammographic images were processed with CAD.
IMPRESSION: No mammographic evidence for malignancy.

RECOMMENDATION:
Screening mammogram in one year.(Code:[OC])

I have discussed the findings and recommendations with the patient.
Results were also provided in writing at the conclusion of the
visit. If applicable, a reminder letter will be sent to the patient
regarding the next appointment.

BI-RADS CATEGORY  1: Negative.

## 2018-11-03 DIAGNOSIS — H25813 Combined forms of age-related cataract, bilateral: Secondary | ICD-10-CM | POA: Diagnosis not present

## 2018-11-03 DIAGNOSIS — H524 Presbyopia: Secondary | ICD-10-CM | POA: Diagnosis not present

## 2018-11-03 DIAGNOSIS — H40013 Open angle with borderline findings, low risk, bilateral: Secondary | ICD-10-CM | POA: Diagnosis not present

## 2018-11-03 DIAGNOSIS — H40053 Ocular hypertension, bilateral: Secondary | ICD-10-CM | POA: Diagnosis not present

## 2018-11-03 DIAGNOSIS — H52223 Regular astigmatism, bilateral: Secondary | ICD-10-CM | POA: Diagnosis not present

## 2018-11-03 DIAGNOSIS — H5203 Hypermetropia, bilateral: Secondary | ICD-10-CM | POA: Diagnosis not present

## 2018-11-03 DIAGNOSIS — H353121 Nonexudative age-related macular degeneration, left eye, early dry stage: Secondary | ICD-10-CM | POA: Diagnosis not present

## 2018-12-24 ENCOUNTER — Other Ambulatory Visit: Payer: Self-pay | Admitting: Internal Medicine

## 2019-01-03 ENCOUNTER — Other Ambulatory Visit (INDEPENDENT_AMBULATORY_CARE_PROVIDER_SITE_OTHER): Payer: PPO

## 2019-01-03 DIAGNOSIS — Z0000 Encounter for general adult medical examination without abnormal findings: Secondary | ICD-10-CM | POA: Diagnosis not present

## 2019-01-03 DIAGNOSIS — R739 Hyperglycemia, unspecified: Secondary | ICD-10-CM | POA: Diagnosis not present

## 2019-01-03 LAB — URINALYSIS, ROUTINE W REFLEX MICROSCOPIC
Bilirubin Urine: NEGATIVE
Hgb urine dipstick: NEGATIVE
Ketones, ur: NEGATIVE
Leukocytes,Ua: NEGATIVE
Nitrite: NEGATIVE
RBC / HPF: NONE SEEN (ref 0–?)
Specific Gravity, Urine: 1.025 (ref 1.000–1.030)
Total Protein, Urine: NEGATIVE
Urine Glucose: NEGATIVE
Urobilinogen, UA: 0.2 (ref 0.0–1.0)
pH: 5 (ref 5.0–8.0)

## 2019-01-03 LAB — CBC WITH DIFFERENTIAL/PLATELET
Basophils Absolute: 0.1 10*3/uL (ref 0.0–0.1)
Basophils Relative: 0.9 % (ref 0.0–3.0)
Eosinophils Absolute: 0.2 10*3/uL (ref 0.0–0.7)
Eosinophils Relative: 2.3 % (ref 0.0–5.0)
HCT: 44.7 % (ref 36.0–46.0)
Hemoglobin: 15 g/dL (ref 12.0–15.0)
Lymphocytes Relative: 33.6 % (ref 12.0–46.0)
Lymphs Abs: 3.3 10*3/uL (ref 0.7–4.0)
MCHC: 33.7 g/dL (ref 30.0–36.0)
MCV: 84 fl (ref 78.0–100.0)
Monocytes Absolute: 0.7 10*3/uL (ref 0.1–1.0)
Monocytes Relative: 7.1 % (ref 3.0–12.0)
Neutro Abs: 5.5 10*3/uL (ref 1.4–7.7)
Neutrophils Relative %: 56.1 % (ref 43.0–77.0)
Platelets: 164 10*3/uL (ref 150.0–400.0)
RBC: 5.32 Mil/uL — ABNORMAL HIGH (ref 3.87–5.11)
RDW: 13.3 % (ref 11.5–15.5)
WBC: 9.8 10*3/uL (ref 4.0–10.5)

## 2019-01-03 LAB — LIPID PANEL
Cholesterol: 148 mg/dL (ref 0–200)
HDL: 42.7 mg/dL (ref >39.00)
LDL Cholesterol: 76 mg/dL (ref 0–99)
NonHDL: 105.52
Total CHOL/HDL Ratio: 3
Triglycerides: 150 mg/dL — ABNORMAL HIGH (ref 0.0–149.0)
VLDL: 30 mg/dL (ref 0.0–40.0)

## 2019-01-03 LAB — HEPATIC FUNCTION PANEL
ALT: 20 U/L (ref 0–35)
AST: 18 U/L (ref 0–37)
Albumin: 4 g/dL (ref 3.5–5.2)
Alkaline Phosphatase: 86 U/L (ref 39–117)
Bilirubin, Direct: 0.1 mg/dL (ref 0.0–0.3)
Total Bilirubin: 0.6 mg/dL (ref 0.2–1.2)
Total Protein: 7.1 g/dL (ref 6.0–8.3)

## 2019-01-03 LAB — BASIC METABOLIC PANEL
BUN: 23 mg/dL (ref 6–23)
CO2: 24 mEq/L (ref 19–32)
Calcium: 9 mg/dL (ref 8.4–10.5)
Chloride: 103 mEq/L (ref 96–112)
Creatinine, Ser: 0.78 mg/dL (ref 0.40–1.20)
GFR: 72.72 mL/min (ref >60.00)
Glucose, Bld: 103 mg/dL — ABNORMAL HIGH (ref 70–99)
Potassium: 4 mEq/L (ref 3.5–5.1)
Sodium: 138 mEq/L (ref 135–145)

## 2019-01-03 LAB — HEMOGLOBIN A1C: Hgb A1c MFr Bld: 6.3 % (ref 4.6–6.5)

## 2019-01-03 LAB — TSH: TSH: 0.94 u[IU]/mL (ref 0.35–4.50)

## 2019-01-09 ENCOUNTER — Ambulatory Visit (INDEPENDENT_AMBULATORY_CARE_PROVIDER_SITE_OTHER): Payer: PPO | Admitting: Internal Medicine

## 2019-01-09 ENCOUNTER — Encounter: Payer: Self-pay | Admitting: Internal Medicine

## 2019-01-09 DIAGNOSIS — R739 Hyperglycemia, unspecified: Secondary | ICD-10-CM | POA: Diagnosis not present

## 2019-01-09 DIAGNOSIS — I2510 Atherosclerotic heart disease of native coronary artery without angina pectoris: Secondary | ICD-10-CM | POA: Diagnosis not present

## 2019-01-09 DIAGNOSIS — E2839 Other primary ovarian failure: Secondary | ICD-10-CM | POA: Diagnosis not present

## 2019-01-09 DIAGNOSIS — Z0000 Encounter for general adult medical examination without abnormal findings: Secondary | ICD-10-CM

## 2019-01-09 MED ORDER — ATORVASTATIN CALCIUM 20 MG PO TABS: 20.0000 mg | ORAL_TABLET | Freq: Every day | ORAL | 1 refills | Status: DC

## 2019-01-09 MED ORDER — TELMISARTAN-HCTZ 40-12.5 MG PO TABS: 1.0000 | ORAL_TABLET | Freq: Every day | ORAL | 1 refills | Status: DC

## 2019-01-09 MED ORDER — LEVOTHYROXINE SODIUM 125 MCG PO TABS: 125.0000 ug | ORAL_TABLET | Freq: Every day | ORAL | 1 refills | Status: DC

## 2019-01-09 MED ORDER — ZOSTER VAC RECOMB ADJUVANTED 50 MCG/0.5ML IM SUSR: 0.5000 mL | Freq: Once | INTRAMUSCULAR | 1 refills | Status: AC

## 2019-01-09 NOTE — Assessment & Plan Note (Signed)

## 2019-01-09 NOTE — Progress Notes (Signed)
Patient ID: Mary Ward, female   DOB: [birth year], 73 y.o.   MRN: [id number]  Virtual Visit via Video Note  I connected with Nilza Eaker Trimmer on 01/09/19 at  8:00 AM EDT by a video enabled telemedicine application and verified that I am speaking with the correct person using two identifiers. I am at the office, pt is at home, and no others present   I discussed the limitations of evaluation and management by telemedicine and the availability of in person appointments. The patient expressed understanding and agreed to proceed.  History of Present Illness: Here for wellness and f/u;  Overall doing ok;  Pt denies Chest pain, worsening SOB, DOE, wheezing, orthopnea, PND, worsening LE edema, palpitations, dizziness or syncope.  Pt denies neurological change such as new headache, facial or extremity weakness.  Pt denies polydipsia, polyuria, or low sugar symptoms. Pt states overall good compliance with treatment and medications, good tolerability, and has been trying to follow appropriate diet.  Pt denies worsening depressive symptoms, suicidal ideation or panic. No fever, night sweats, wt loss, loss of appetite, or other constitutional symptoms.  Pt states good ability with ADL's, has low fall risk, home safety reviewed and adequate, no other significant changes in hearing or vision, and only some active with exercise with gym 4 days per wk prior to pandeminc, now still with home exercise and yardwork.  Beloved dog died last yr.  Husbands depression better.  Has had some significant plantar fasciitis recenlty improved with steroid topical patch per ortho   Past Medical History:  Diagnosis Date  . Allergy   . Anxiety    no per pt  . Cataract    per pt "small one starting", no need for treatment at this time  . EN (erythema nodosum)   . GERD (gastroesophageal reflux disease)   . Hyperlipidemia   . Hyperplastic colonic polyp   . Hypertension   . Hypothyroidism   . Obesity    Past Surgical History:   Procedure Laterality Date  . BREAST REDUCTION SURGERY  2002  . BUNIONECTOMY     both feet  . COLONOSCOPY    . CYST EXCISION  12/15/12   punch biopsy left shoulder blade  . HAMMER TOE SURGERY     right 2nd toe  . LAPAROSCOPIC ASSISTED VAGINAL HYSTERECTOMY  1992   with BSO  . OOPHORECTOMY    . TONSILLECTOMY      reports that she has never smoked. She has never used smokeless tobacco. She reports current alcohol use of about 1.0 standard drinks of alcohol per week. She reports that she does not use drugs. family history includes Cancer in her father; Diabetes in her sister; Heart attack in her mother; Heart disease in her sister; Hyperlipidemia in her mother; Hypertension in her mother and sister; Stroke in her mother. Allergies  Allergen Reactions  . Indomethacin     Took with ASA and caused severe bloating  . Prednisone     Developed acne  . Prevnar 13 [Pneumococcal 13-Val Conj Vacc]     Arm became very sore and pt developed "fiery red knot" at site  . Tetanus Toxoid     Entire arm became swollen and turned red  . Tetracycline     "hot, red knots" below knees   Current Outpatient Medications on File Prior to Visit  Medication Sig Dispense Refill  . aspirin 81 MG tablet Take 81 mg by mouth daily.      . diphenhydrAMINE (BENADRYL) 12.5  MG/5ML elixir Take by mouth as needed for allergies.    Noelle Penner FIBER SUPPLEMENT PO Take by mouth.     No current facility-administered medications on file prior to visit.    Observations/Objective: Alert, NAD, mild nervous, slight tearing in discussion of husband, resp normal, cn 2-12 intact, moves all 4s, no visible rash or swelling  Lab Results  Component Value Date   WBC 9.8 01/03/2019   HGB 15.0 01/03/2019   HCT 44.7 01/03/2019   PLT 164.0 01/03/2019   GLUCOSE 103 (H) 01/03/2019   CHOL 148 01/03/2019   TRIG 150.0 (H) 01/03/2019   HDL 42.70 01/03/2019   LDLCALC 76 01/03/2019   ALT 20 01/03/2019   AST 18 01/03/2019   NA 138 01/03/2019    K 4.0 01/03/2019   CL 103 01/03/2019   CREATININE 0.78 01/03/2019   BUN 23 01/03/2019   CO2 24 01/03/2019   TSH 0.94 01/03/2019   HGBA1C 6.3 01/03/2019   Assessment and Plan: See notes  Follow Up Instructions: See notes   I discussed the assessment and treatment plan with the patient. The patient was provided an opportunity to ask questions and all were answered. The patient agreed with the plan and demonstrated an understanding of the instructions.   The patient was advised to call back or seek an in-person evaluation if the symptoms worsen or if the condition fails to improve as anticipated.  Cathlean Cower, MD

## 2019-01-09 NOTE — Patient Instructions (Signed)
Please continue all other medications as before, and refills have been done if requested.  Please have the pharmacy call with any other refills you may need.  Please continue your efforts at being more active, low cholesterol diet, and weight control.  You are otherwise up to date with prevention measures today.  Please keep your appointments with your specialists as you may have planned  You will be contacted regarding the referral for: Bone density scan, colonoscopy, and Cardiac CT score testing  Your shingles shot was sent to the pharmacy  Please return in 1 year for your yearly visit, or sooner if needed, with Lab testing done 3-5 days before

## 2019-01-09 NOTE — Assessment & Plan Note (Signed)
stable overall by history and exam, recent data reviewed with pt, and pt to continue medical treatment as before,  to f/u any worsening symptoms or concerns  

## 2019-01-09 NOTE — Assessment & Plan Note (Signed)
Pt requests ct cardiac scoring for further evaluation

## 2019-02-08 ENCOUNTER — Encounter: Payer: Self-pay | Admitting: Gastroenterology

## 2019-02-22 ENCOUNTER — Encounter: Payer: Self-pay | Admitting: Gastroenterology

## 2019-03-22 ENCOUNTER — Other Ambulatory Visit: Payer: Self-pay

## 2019-03-22 ENCOUNTER — Ambulatory Visit (AMBULATORY_SURGERY_CENTER): Payer: Self-pay | Admitting: *Deleted

## 2019-03-22 VITALS — Ht 65.0 in | Wt 215.0 lb

## 2019-03-22 DIAGNOSIS — Z86010 Personal history of colonic polyps: Secondary | ICD-10-CM

## 2019-03-22 MED ORDER — NA SULFATE-K SULFATE-MG SULF 17.5-3.13-1.6 GM/177ML PO SOLN: ORAL | 0 refills | Status: DC

## 2019-03-22 NOTE — Progress Notes (Signed)
Patient's pre-visit was done today over the phone with the patient due to COVID-19 pandemic. Name,DOB and address verified. Insurance verified. Packet of Prep instructions mailed to patient including copy of a consent form and pre-procedure patient acknowledgement form-pt is aware. Suprep $15 Coupon included. Patient request Suprep over [employer]. Patient understands to call us back with any questions or concerns. Patient denies any allergies to eggs or soy. Patient denies any problems with anesthesia/sedation. Patient denies any oxygen use at home. Patient denies taking any diet/weight loss medications or blood thinners. Pt is aware that care partner will wait in the car during proceudre; if they feel like they will be too hot to wait in the car; they may wait in the lobby.  We want them to wear a mask (we do not have any that we can provide them), practice social distancing, and we will check their temperatures when they get here.  I did remind patient that their care partner needs to stay in the parking lot the entire time. Pt will wear mask into building.

## 2019-03-28 ENCOUNTER — Ambulatory Visit (INDEPENDENT_AMBULATORY_CARE_PROVIDER_SITE_OTHER)
Admission: RE | Admit: 2019-03-28 | Discharge: 2019-03-28 | Disposition: A | Discharge status: Home / Self Care | Payer: PPO | Source: Ambulatory Visit | Attending: Internal Medicine | Admitting: Internal Medicine

## 2019-03-28 ENCOUNTER — Other Ambulatory Visit: Payer: Self-pay

## 2019-03-28 DIAGNOSIS — E2839 Other primary ovarian failure: Secondary | ICD-10-CM | POA: Diagnosis not present

## 2019-03-29 ENCOUNTER — Encounter: Payer: Self-pay | Admitting: Internal Medicine

## 2019-04-04 ENCOUNTER — Telehealth: Payer: Self-pay | Admitting: Gastroenterology

## 2019-04-04 ENCOUNTER — Inpatient Hospital Stay: Admission: RE | Admit: 2019-04-04 | Payer: PPO | Source: Ambulatory Visit

## 2019-04-04 NOTE — Telephone Encounter (Signed)

## 2019-04-05 ENCOUNTER — Encounter: Payer: Self-pay | Admitting: Gastroenterology

## 2019-04-05 ENCOUNTER — Other Ambulatory Visit: Payer: Self-pay

## 2019-04-05 ENCOUNTER — Ambulatory Visit (AMBULATORY_SURGERY_CENTER): Payer: PPO | Admitting: Gastroenterology

## 2019-04-05 VITALS — BP 131/68 | HR 58 | Temp 97.6°F | Resp 16 | Ht 66.0 in | Wt 211.0 lb

## 2019-04-05 DIAGNOSIS — K621 Rectal polyp: Secondary | ICD-10-CM | POA: Diagnosis not present

## 2019-04-05 DIAGNOSIS — Z86010 Personal history of colonic polyps: Secondary | ICD-10-CM

## 2019-04-05 DIAGNOSIS — D128 Benign neoplasm of rectum: Secondary | ICD-10-CM

## 2019-04-05 MED ORDER — SODIUM CHLORIDE 0.9 % IV SOLN: 500.0000 mL | Freq: Once | INTRAVENOUS | Status: DC

## 2019-04-05 NOTE — Progress Notes (Signed)
Called to room to assist during endoscopic procedure.  Patient ID and intended procedure confirmed with present staff. Received instructions for my participation in the procedure from the performing physician.  

## 2019-04-05 NOTE — Progress Notes (Signed)
Pt's states no medical or surgical changes since previsit or office visit.  Mary Ward

## 2019-04-05 NOTE — Op Note (Signed)
[facility] Patient Name: Mary Ward Procedure Date: 04/05/2019 7:31 AM MRN: [id number] Endoscopist: Ladene Artist , MD Age: 73 Referring MD:  Date of Birth: 1946-06-04 Gender: Female Account #: [id number] Procedure:                Colonoscopy Indications:              Surveillance: Personal history of adenomatous                            polyps on last colonoscopy 5 years ago Medicines:                Monitored Anesthesia Care Procedure:                Pre-Anesthesia Assessment:                           - Prior to the procedure, a History and Physical                            was performed, and patient medications and                            allergies were reviewed. The patient's tolerance of                            previous anesthesia was also reviewed. The risks                            and benefits of the procedure and the sedation                            options and risks were discussed with the patient.                            All questions were answered, and informed consent                            was obtained. Prior Anticoagulants: The patient has                            taken no previous anticoagulant or antiplatelet                            agents. ASA Grade Assessment: II - A patient with                            mild systemic disease. After reviewing the risks                            and benefits, the patient was deemed in                            satisfactory condition to undergo the procedure.  After obtaining informed consent, the colonoscope                            was passed under direct vision. Throughout the                            procedure, the patient's blood pressure, pulse, and                            oxygen saturations were monitored continuously. The                            Colonoscope was introduced through the anus and                            advanced to the the cecum,  identified by                            appendiceal orifice and ileocecal valve. The                            ileocecal valve, appendiceal orifice, and rectum                            were photographed. The quality of the bowel                            preparation was excellent. The colonoscopy was                            performed without difficulty. The patient tolerated                            the procedure well. Scope In: 7:40:35 AM Scope Out: 7:53:26 AM Scope Withdrawal Time: 0 hours 10 minutes 59 seconds  Total Procedure Duration: 0 hours 12 minutes 51 seconds  Findings:                 The perianal and digital rectal examinations were                            normal.                           Two sessile polyps were found in the rectum. The                            polyps were 4 mm in size. These polyps were removed                            with a cold biopsy forceps. Resection and retrieval                            were complete.  Multiple small-mouthed diverticula were found in                            the left colon. There was no evidence of                            diverticular bleeding.                           Internal hemorrhoids were found during                            retroflexion. The hemorrhoids were small and Grade                            I (internal hemorrhoids that do not prolapse).                           The exam was otherwise without abnormality on                            direct and retroflexion views. Complications:            No immediate complications. Estimated blood loss:                            None. Estimated Blood Loss:     Estimated blood loss: none. Impression:               - Two 4 mm polyps in the rectum, removed with a                            cold biopsy forceps. Resected and retrieved.                           - Mild diverticulosis in the left colon.                           -  Internal hemorrhoids.                           - The examination was otherwise normal on direct                            and retroflexion views. Recommendation:           - Repeat colonoscopy likely in 7 years for                            surveillance pending pathology review.                           - Patient has a contact number available for                            emergencies. The signs and symptoms of potential  delayed complications were discussed with the                            patient. Return to normal activities tomorrow.                            Written discharge instructions were provided to the                            patient.                           - High fiber diet.                           - Continue present medications.                           - Await pathology results. Ladene Artist, MD 04/05/2019 7:57:24 AM This report has been signed electronically.

## 2019-04-05 NOTE — Progress Notes (Signed)
PT taken to PACU. Monitors in place. VSS. Report given to RN. 

## 2019-04-05 NOTE — Patient Instructions (Signed)
Thank you for allowing [place] to participate in your care today!  Await pathology results by mail, approximately 2 weeks.  Will make recommendations at that time for future colonoscopy.  Resume previous diet and medications today.  Return to your normal activities tomorrow.     YOU HAD AN ENDOSCOPIC PROCEDURE TODAY AT [facility]:   Refer to the procedure report that was given to you for any specific questions about what was found during the examination.  If the procedure report does not answer your questions, please call your gastroenterologist to clarify.  If you requested that your care partner not be given the details of your procedure findings, then the procedure report has been included in a sealed envelope for you to review at your convenience later.  YOU SHOULD EXPECT: Some feelings of bloating in the abdomen. Passage of more gas than usual.  Walking can help get rid of the air that was put into your GI tract during the procedure and reduce the bloating. If you had a lower endoscopy (such as a colonoscopy or flexible sigmoidoscopy) you may notice spotting of blood in your stool or on the toilet paper. If you underwent a bowel prep for your procedure, you may not have a normal bowel movement for a few days.  Please Note:  You might notice some irritation and congestion in your nose or some drainage.  This is from the oxygen used during your procedure.  There is no need for concern and it should clear up in a day or so.  SYMPTOMS TO REPORT IMMEDIATELY:   Following lower endoscopy (colonoscopy or flexible sigmoidoscopy):  Excessive amounts of blood in the stool  Significant tenderness or worsening of abdominal pains  Swelling of the abdomen that is new, acute  Fever of 100F or higher  For urgent or emergent issues, a gastroenterologist can be reached at any hour by calling [phone].   DIET:  We do recommend a small meal at first, but then you may proceed to your  regular diet.  Drink plenty of fluids but you should avoid alcoholic beverages for 24 hours.  ACTIVITY:  You should plan to take it easy for the rest of today and you should NOT DRIVE or use heavy machinery until tomorrow (because of the sedation medicines used during the test).    FOLLOW UP: Our staff will call the number listed on your records 48-72 hours following your procedure to check on you and address any questions or concerns that you may have regarding the information given to you following your procedure. If we do not reach you, we will leave a message.  We will attempt to reach you two times.  During this call, we will ask if you have developed any symptoms of COVID 19. If you develop any symptoms (ie: fever, flu-like symptoms, shortness of breath, cough etc.) before then, please call [phone].  If you test positive for Covid 19 in the 2 weeks post procedure, please call and report this information to [place].    If any biopsies were taken you will be contacted by phone or by letter within the next 1-3 weeks.  Please call us at [phone] if you have not heard about the biopsies in 3 weeks.    SIGNATURES/CONFIDENTIALITY: You and/or your care partner have signed paperwork which will be entered into your electronic medical record.  These signatures attest to the fact that that the information above on your After Visit Summary has been  reviewed and is understood.  Full responsibility of the confidentiality of this discharge information lies with you and/or your care-partner. 

## 2019-04-09 ENCOUNTER — Telehealth: Payer: Self-pay

## 2019-04-09 NOTE — Telephone Encounter (Signed)
  Follow up Call-  Call back number 04/05/2019  Post procedure Call Back phone  # [phone]  Permission to leave phone message Yes  Some recent data might be hidden     Patient questions:  Do you have a fever, pain , or abdominal swelling? No. Pain Score  0 *  Have you tolerated food without any problems? Yes.    Have you been able to return to your normal activities? Yes.    Do you have any questions about your discharge instructions: Diet   No. Medications  No. Follow up visit  No.  Do you have questions or concerns about your Care? No.  Actions: * If pain score is 4 or above: No action needed, pain <4.  1. Have you developed a fever since your procedure? no  2.   Have you had an respiratory symptoms (SOB or cough) since your procedure? no  3.   Have you tested positive for COVID 19 since your procedure no  4.   Have you had any family members/close contacts diagnosed with the COVID 19 since your procedure?  no   If yes to any of these questions please route to Joylene John, RN and Alphonsa Gin, Therapist, sports.

## 2019-04-09 NOTE — Telephone Encounter (Signed)
NO ANSWER, MESSAGE LEFT FOR PATIENT. 

## 2019-04-11 ENCOUNTER — Encounter: Payer: Self-pay | Admitting: Gastroenterology

## 2019-04-18 ENCOUNTER — Ambulatory Visit (INDEPENDENT_AMBULATORY_CARE_PROVIDER_SITE_OTHER)
Admission: RE | Admit: 2019-04-18 | Discharge: 2019-04-18 | Disposition: A | Discharge status: Home / Self Care | Payer: Self-pay | Source: Ambulatory Visit | Attending: Internal Medicine | Admitting: Internal Medicine

## 2019-04-18 ENCOUNTER — Other Ambulatory Visit: Payer: Self-pay

## 2019-04-18 ENCOUNTER — Telehealth: Payer: Self-pay

## 2019-04-18 DIAGNOSIS — I2510 Atherosclerotic heart disease of native coronary artery without angina pectoris: Secondary | ICD-10-CM

## 2019-04-18 IMAGING — CT CT HEART SCORING
2 series · 15 of 20 positions shown, 17 images · non-contrast
Comparison: Chest CT [DATE].
COMPARISON: Chest CT [DATE].

Addendum:
EXAM:
OVER-READ INTERPRETATION  CT CHEST

The following report is an over-read performed by radiologist Dr.
RAYMUND [REDACTED] on [DATE]. This
over-read does not include interpretation of cardiac or coronary
anatomy or pathology. The coronary calcium score interpretation by
the cardiologist is attached.
CLINICAL DATA: Risk stratification
Coronary Calcium Score
TECHNIQUE: The patient was scanned on a Siemens Force scanner. Axial
non-contrast 3 mm slices were carried out through the heart. The
data set was analyzed on a dedicated work station and scored using
the Agatson method.

[Series 2: casc 3.0 i36f 2 bestdiast 69 % · axial · 0.34mm/px · z∈[-231,-147]mm · 8 of 38 slices shown, 10 images]
[im 5/38  vessel]
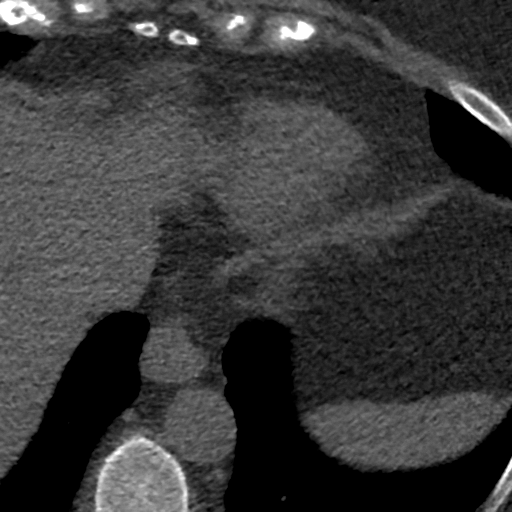
[im 5/38  lung]
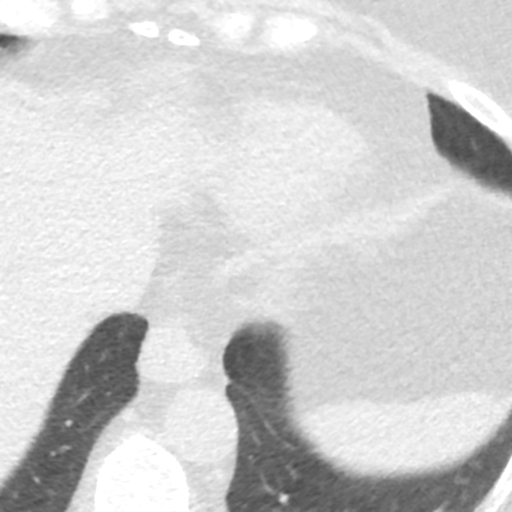
[im 9/38  vessel]
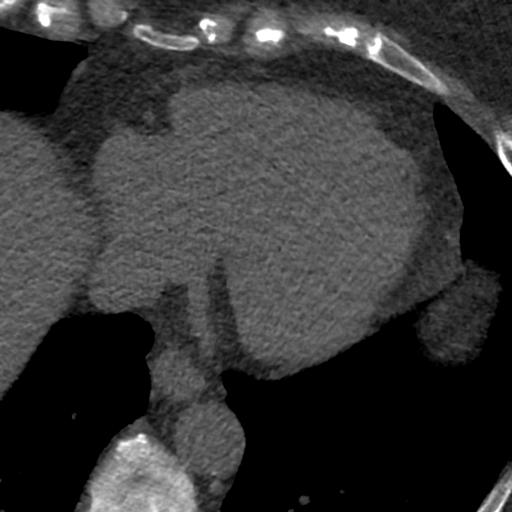
[im 13/38  vessel]
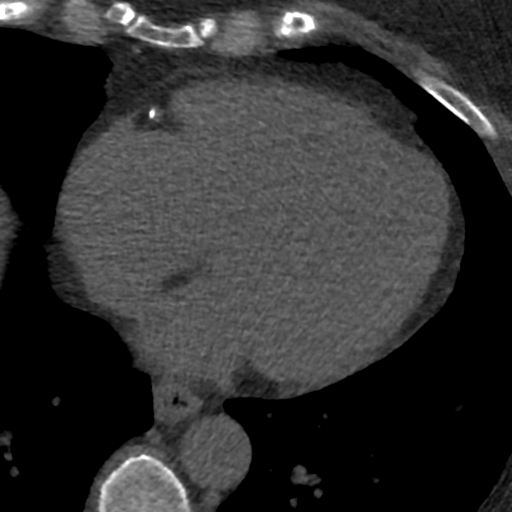
[im 17/38  vessel]
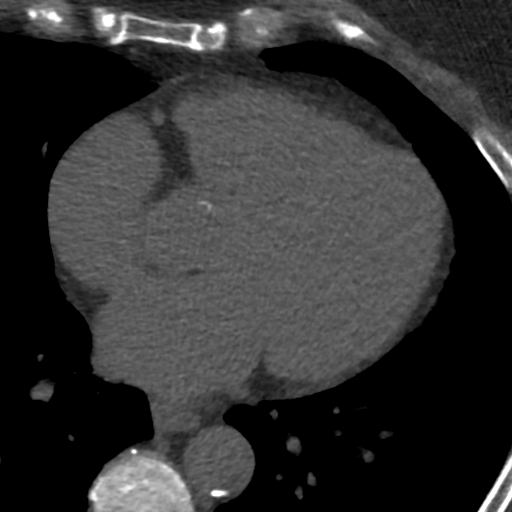
[im 21/38  vessel]
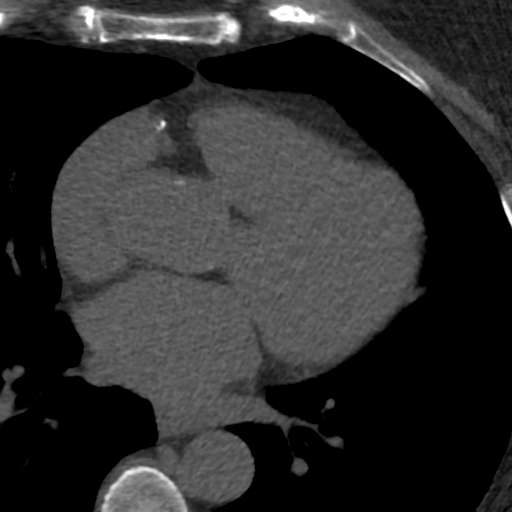
[im 21/38  lung]
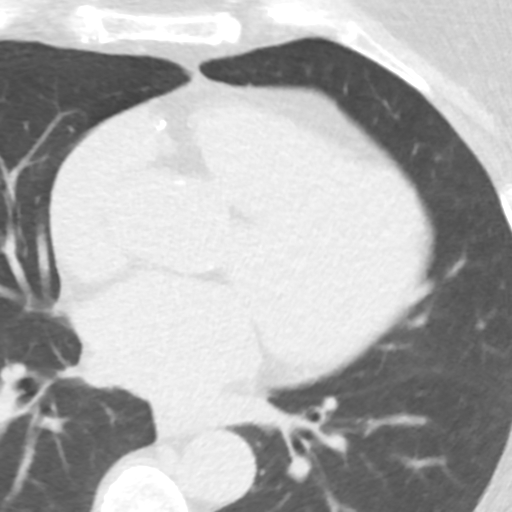
[im 25/38  vessel]
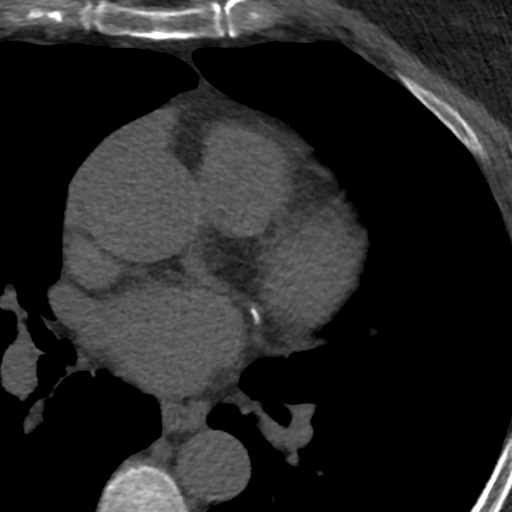
[im 29/38  vessel]
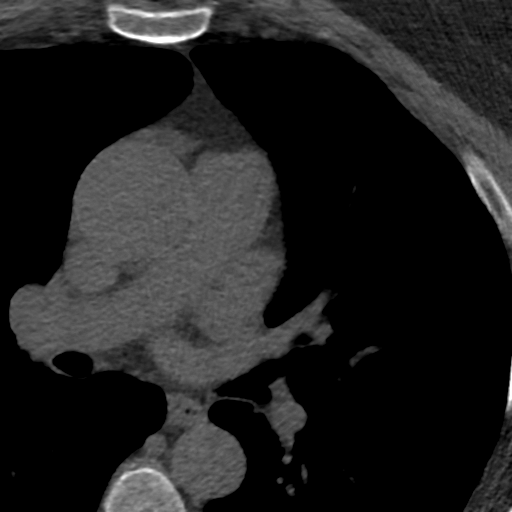
[im 33/38  vessel]
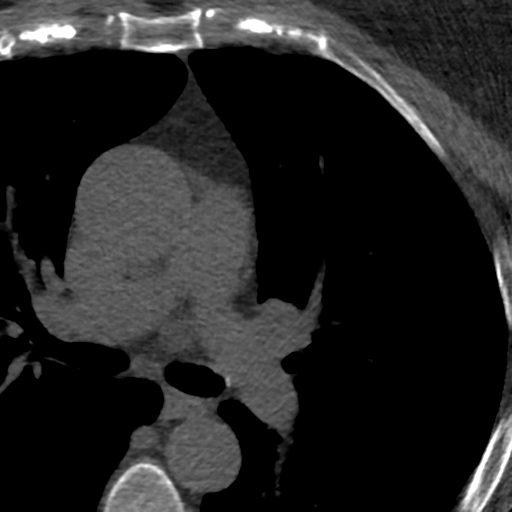

[Series 4: lung st 70 % · axial · 0.71mm/px · z∈[-230,-158]mm · 7 of 38 slices shown]
[im 5/38  lung]
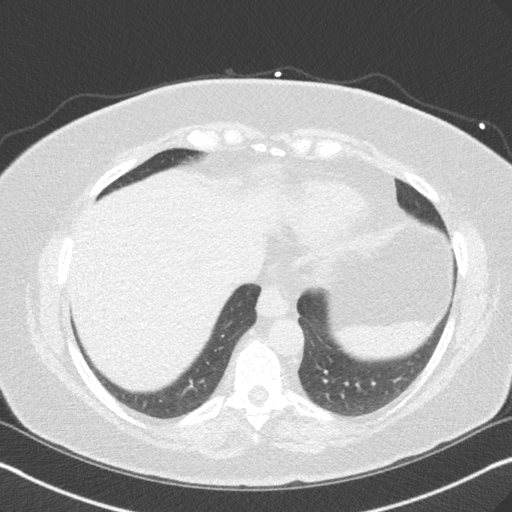
[im 9/38  lung]
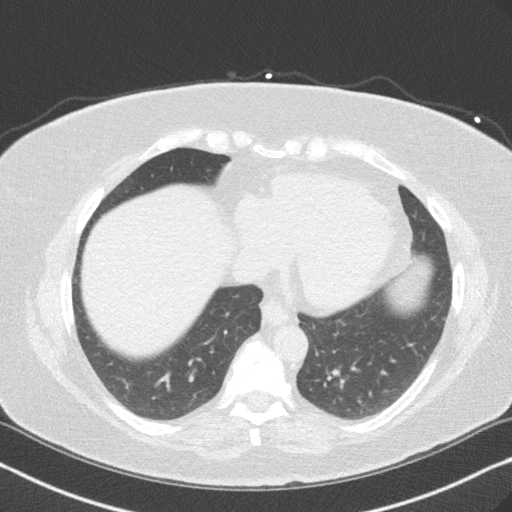
[im 13/38  lung]
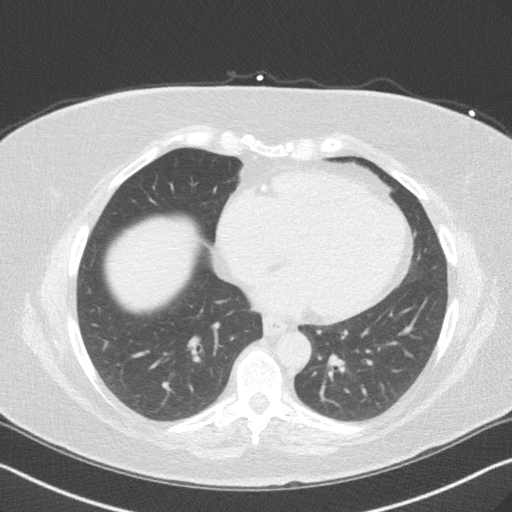
[im 17/38  lung]
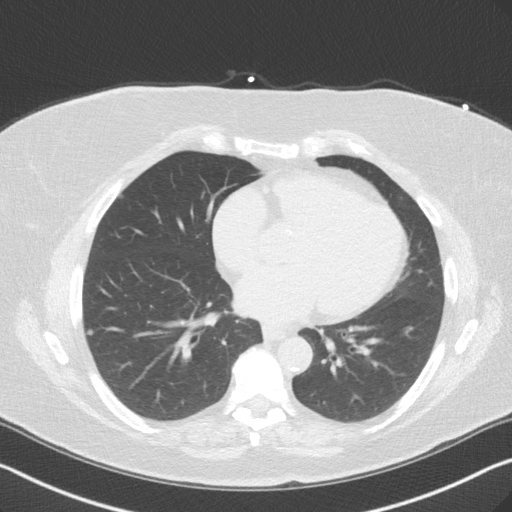
[im 21/38  lung]
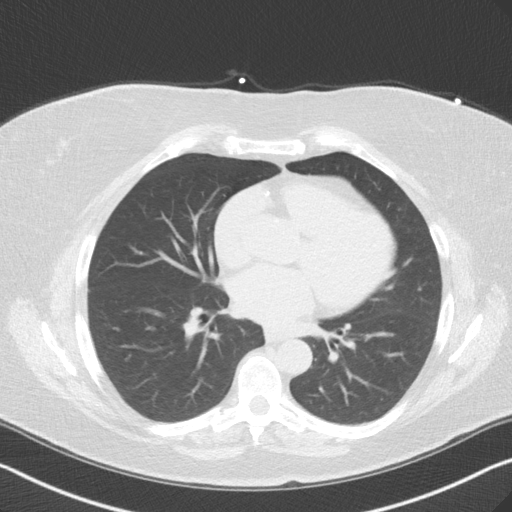
[im 25/38  lung]
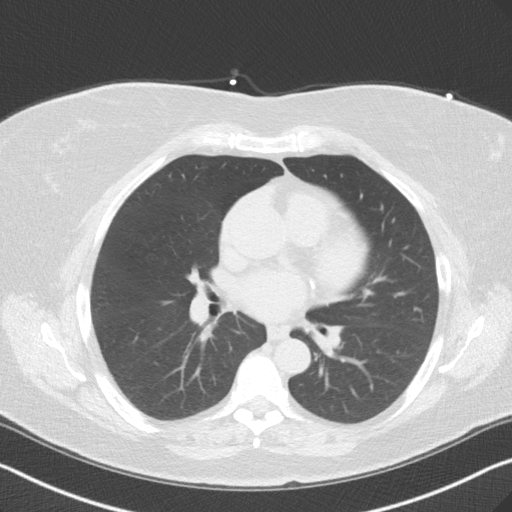
[im 29/38  lung]
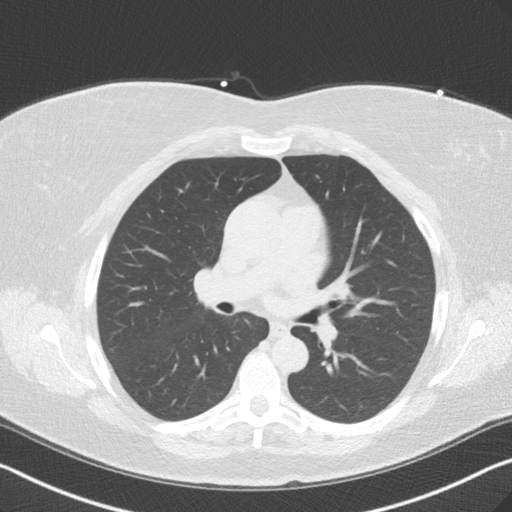

[15 of 20 positions shown; findings below may reference images not displayed]

FINDINGS: Aortic atherosclerosis. Multiple small pulmonary nodules are noted
in the right lung, largest of which measures only 4 mm. Within the
visualized portions of the thorax there are no other larger more
suspicious appearing pulmonary nodules or masses, there is no acute
consolidative airspace disease, no pleural effusions, no
pneumothorax and no lymphadenopathy. Visualized portions of the
upper abdomen demonstrates diffuse low attenuation throughout the
visualized hepatic parenchyma. There are no aggressive appearing
lytic or blastic lesions noted in the visualized portions of the
skeleton.
IMPRESSION: 1. Multiple small pulmonary nodules in the lungs bilaterally, 4 mm
or less in size. These are nonspecific, but statistically likely
benign. No follow-up needed if patient is low-risk (and has no known
or suspected primary neoplasm). Non-contrast chest CT can be
considered in 12 months if patient is high-risk. This recommendation
follows the consensus statement: Guidelines for Management of
Incidental Pulmonary Nodules Detected on CT Images: From the
2. Hepatic steatosis.
3.  Aortic Atherosclerosis ([4S]-[4S]).
FINDINGS: Non-cardiac: See separate report from [REDACTED].

Ascending Aorta: Mildly dilated at 40mm. Mild aortic calcifications.
Mild aortic annular calcifications.

Pericardium: Normal

Coronary arteries: Normal coronary origins. Calcifications noted in
all 3 epicardial vessels.
IMPRESSION: Coronary calcium score of 225. This was 85th percentile for age and
sex matched control.

Recommend aggressive risk factor modification.

*** End of Addendum ***
EXAM:
OVER-READ INTERPRETATION  CT CHEST

The following report is an over-read performed by radiologist Dr.
RAYMUND [REDACTED] on [DATE]. This
over-read does not include interpretation of cardiac or coronary
anatomy or pathology. The coronary calcium score interpretation by
the cardiologist is attached.
FINDINGS: Aortic atherosclerosis. Multiple small pulmonary nodules are noted
in the right lung, largest of which measures only 4 mm. Within the
visualized portions of the thorax there are no other larger more
suspicious appearing pulmonary nodules or masses, there is no acute
consolidative airspace disease, no pleural effusions, no
pneumothorax and no lymphadenopathy. Visualized portions of the
upper abdomen demonstrates diffuse low attenuation throughout the
visualized hepatic parenchyma. There are no aggressive appearing
lytic or blastic lesions noted in the visualized portions of the
skeleton.
IMPRESSION: 1. Multiple small pulmonary nodules in the lungs bilaterally, 4 mm
or less in size. These are nonspecific, but statistically likely
benign. No follow-up needed if patient is low-risk (and has no known
or suspected primary neoplasm). Non-contrast chest CT can be
considered in 12 months if patient is high-risk. This recommendation
follows the consensus statement: Guidelines for Management of
Incidental Pulmonary Nodules Detected on CT Images: From the
2. Hepatic steatosis.
3.  Aortic Atherosclerosis ([4S]-[4S]).

## 2019-04-18 NOTE — Telephone Encounter (Signed)
Pt has viewed results via MyChart  

## 2019-04-18 NOTE — Telephone Encounter (Signed)
-----   Message from Biagio Borg, MD sent at 04/18/2019 11:12 AM EDT ----- Left message on MyChart, pt to cont same tx except  The test results show that your current treatment is OK, as the CT Cardiac Calcium score is 225, which is moderately high.  At this point, you would need to really to focus on bad risk factors such as hypertension, cholesterol, sugar and smoking, as these will all contribute to eventual worsening.  You would not need to see cardiology at this time, unless you feel you want to do this

## 2019-06-04 ENCOUNTER — Other Ambulatory Visit: Payer: Self-pay | Admitting: Internal Medicine

## 2019-06-04 MED ORDER — SYNTHROID 125 MCG PO TABS: 125.0000 ug | ORAL_TABLET | Freq: Every day | ORAL | 1 refills | Status: DC

## 2019-06-20 DIAGNOSIS — L814 Other melanin hyperpigmentation: Secondary | ICD-10-CM | POA: Diagnosis not present

## 2019-06-20 DIAGNOSIS — D1801 Hemangioma of skin and subcutaneous tissue: Secondary | ICD-10-CM | POA: Diagnosis not present

## 2019-06-20 DIAGNOSIS — L821 Other seborrheic keratosis: Secondary | ICD-10-CM | POA: Diagnosis not present

## 2019-06-20 DIAGNOSIS — D485 Neoplasm of uncertain behavior of skin: Secondary | ICD-10-CM | POA: Diagnosis not present

## 2019-06-20 DIAGNOSIS — L918 Other hypertrophic disorders of the skin: Secondary | ICD-10-CM | POA: Diagnosis not present

## 2019-06-20 DIAGNOSIS — D225 Melanocytic nevi of trunk: Secondary | ICD-10-CM | POA: Diagnosis not present

## 2019-06-20 DIAGNOSIS — L570 Actinic keratosis: Secondary | ICD-10-CM | POA: Diagnosis not present

## 2019-06-23 ENCOUNTER — Other Ambulatory Visit: Payer: Self-pay

## 2019-06-23 ENCOUNTER — Ambulatory Visit (INDEPENDENT_AMBULATORY_CARE_PROVIDER_SITE_OTHER): Payer: PPO

## 2019-06-23 DIAGNOSIS — Z23 Encounter for immunization: Secondary | ICD-10-CM | POA: Diagnosis not present

## 2019-08-28 ENCOUNTER — Other Ambulatory Visit: Payer: Self-pay | Admitting: Internal Medicine

## 2019-10-02 ENCOUNTER — Other Ambulatory Visit: Payer: PPO

## 2019-10-09 ENCOUNTER — Ambulatory Visit: Payer: PPO

## 2019-10-18 ENCOUNTER — Ambulatory Visit: Payer: PPO | Attending: Internal Medicine

## 2019-10-18 DIAGNOSIS — Z23 Encounter for immunization: Secondary | ICD-10-CM | POA: Insufficient documentation

## 2019-10-18 NOTE — Progress Notes (Signed)
   Covid-19 Vaccination Clinic  Name:  Mary Ward    MRN: [id number] DOB: 10-06-1945  10/18/2019  [name] was observed post Covid-19 immunization for 15 minutes without incidence. She was provided with Vaccine Information Sheet and instruction to access the V-Safe system.   [name] was instructed to call 911 with any severe reactions post vaccine: Marland Kitchen Difficulty breathing  . Swelling of your face and throat  . A fast heartbeat  . A bad rash all over your body  . Dizziness and weakness    Immunizations Administered    Name Date Dose VIS Date Route   Pfizer COVID-19 Vaccine 10/18/2019  4:59 PM 0.3 mL 08/24/2019 Intramuscular   Manufacturer: [place]   Lot: [id number]   [place]: [id number]

## 2019-10-22 DIAGNOSIS — Z6832 Body mass index (BMI) 32.0-32.9, adult: Secondary | ICD-10-CM | POA: Diagnosis not present

## 2019-10-22 DIAGNOSIS — Z01419 Encounter for gynecological examination (general) (routine) without abnormal findings: Secondary | ICD-10-CM | POA: Diagnosis not present

## 2019-11-12 ENCOUNTER — Ambulatory Visit: Payer: PPO | Attending: Internal Medicine

## 2019-11-12 DIAGNOSIS — Z23 Encounter for immunization: Secondary | ICD-10-CM

## 2019-11-12 NOTE — Progress Notes (Signed)
   Covid-19 Vaccination Clinic  Name:  Mary Ward    MRN: [id number] DOB: [birth year]  11/12/2019  [name] was observed post Covid-19 immunization for 15 minutes without incidence. She was provided with Vaccine Information Sheet and instruction to access the V-Safe system.   [name] was instructed to call 911 with any severe reactions post vaccine: Marland Kitchen Difficulty breathing  . Swelling of your face and throat  . A fast heartbeat  . A bad rash all over your body  . Dizziness and weakness    Immunizations Administered    Name Date Dose VIS Date Route   Pfizer COVID-19 Vaccine 11/12/2019  3:02 PM 0.3 mL 08/24/2019 Intramuscular   Manufacturer: [place]   Lot: [id number]   [place]: [id number]

## 2019-11-26 ENCOUNTER — Other Ambulatory Visit: Payer: Self-pay | Admitting: Internal Medicine

## 2019-11-26 DIAGNOSIS — N390 Urinary tract infection, site not specified: Secondary | ICD-10-CM | POA: Diagnosis not present

## 2019-11-26 DIAGNOSIS — R319 Hematuria, unspecified: Secondary | ICD-10-CM | POA: Diagnosis not present

## 2019-11-26 DIAGNOSIS — R309 Painful micturition, unspecified: Secondary | ICD-10-CM | POA: Diagnosis not present

## 2019-11-26 NOTE — Telephone Encounter (Signed)
Please refill as per office routine med refill policy (all routine meds refilled for 3 mo or monthly per pt preference up to one year from last visit, then month to month grace period for 3 mo, then further med refills will have to be denied)  

## 2019-12-24 DIAGNOSIS — R309 Painful micturition, unspecified: Secondary | ICD-10-CM | POA: Diagnosis not present

## 2019-12-24 DIAGNOSIS — N390 Urinary tract infection, site not specified: Secondary | ICD-10-CM | POA: Diagnosis not present

## 2019-12-24 DIAGNOSIS — R1030 Lower abdominal pain, unspecified: Secondary | ICD-10-CM | POA: Diagnosis not present

## 2020-01-01 ENCOUNTER — Other Ambulatory Visit (INDEPENDENT_AMBULATORY_CARE_PROVIDER_SITE_OTHER): Payer: PPO

## 2020-01-01 DIAGNOSIS — Z0000 Encounter for general adult medical examination without abnormal findings: Secondary | ICD-10-CM

## 2020-01-01 DIAGNOSIS — R739 Hyperglycemia, unspecified: Secondary | ICD-10-CM | POA: Diagnosis not present

## 2020-01-01 LAB — LIPID PANEL
Cholesterol: 133 mg/dL (ref 0–200)
HDL: 46.2 mg/dL (ref >39.00)
LDL Cholesterol: 64 mg/dL (ref 0–99)
NonHDL: 86.31
Total CHOL/HDL Ratio: 3
Triglycerides: 111 mg/dL (ref 0.0–149.0)
VLDL: 22.2 mg/dL (ref 0.0–40.0)

## 2020-01-01 LAB — BASIC METABOLIC PANEL
BUN: 14 mg/dL (ref 6–23)
CO2: 29 mEq/L (ref 19–32)
Calcium: 9.1 mg/dL (ref 8.4–10.5)
Chloride: 104 mEq/L (ref 96–112)
Creatinine, Ser: 0.83 mg/dL (ref 0.40–1.20)
GFR: 67.5 mL/min (ref >60.00)
Glucose, Bld: 117 mg/dL — ABNORMAL HIGH (ref 70–99)
Potassium: 4 mEq/L (ref 3.5–5.1)
Sodium: 141 mEq/L (ref 135–145)

## 2020-01-01 LAB — URINALYSIS, ROUTINE W REFLEX MICROSCOPIC
Bilirubin Urine: NEGATIVE
Ketones, ur: NEGATIVE
Leukocytes,Ua: NEGATIVE
Nitrite: NEGATIVE
RBC / HPF: NONE SEEN (ref 0–?)
Specific Gravity, Urine: >=1.03 — AB (ref 1.000–1.030)
Total Protein, Urine: NEGATIVE
Urine Glucose: NEGATIVE
Urobilinogen, UA: 0.2 (ref 0.0–1.0)
pH: 5.5 (ref 5.0–8.0)

## 2020-01-01 LAB — CBC WITH DIFFERENTIAL/PLATELET
Basophils Absolute: 0 10*3/uL (ref 0.0–0.1)
Basophils Relative: 0.2 % (ref 0.0–3.0)
Eosinophils Absolute: 0.2 10*3/uL (ref 0.0–0.7)
Eosinophils Relative: 2.4 % (ref 0.0–5.0)
HCT: 42.4 % (ref 36.0–46.0)
Hemoglobin: 14.1 g/dL (ref 12.0–15.0)
Lymphocytes Relative: 37.4 % (ref 12.0–46.0)
Lymphs Abs: 2.8 10*3/uL (ref 0.7–4.0)
MCHC: 33.2 g/dL (ref 30.0–36.0)
MCV: 84.9 fl (ref 78.0–100.0)
Monocytes Absolute: 0.5 10*3/uL (ref 0.1–1.0)
Monocytes Relative: 7.1 % (ref 3.0–12.0)
Neutro Abs: 4 10*3/uL (ref 1.4–7.7)
Neutrophils Relative %: 52.9 % (ref 43.0–77.0)
Platelets: 158 10*3/uL (ref 150.0–400.0)
RBC: 4.99 Mil/uL (ref 3.87–5.11)
RDW: 13.7 % (ref 11.5–15.5)
WBC: 7.5 10*3/uL (ref 4.0–10.5)

## 2020-01-01 LAB — HEPATIC FUNCTION PANEL
ALT: 23 U/L (ref 0–35)
AST: 19 U/L (ref 0–37)
Albumin: 3.9 g/dL (ref 3.5–5.2)
Alkaline Phosphatase: 88 U/L (ref 39–117)
Bilirubin, Direct: 0.1 mg/dL (ref 0.0–0.3)
Total Bilirubin: 0.4 mg/dL (ref 0.2–1.2)
Total Protein: 6.7 g/dL (ref 6.0–8.3)

## 2020-01-01 LAB — HEMOGLOBIN A1C: Hgb A1c MFr Bld: 6.2 % (ref 4.6–6.5)

## 2020-01-01 LAB — TSH: TSH: 1.61 u[IU]/mL (ref 0.35–4.50)

## 2020-01-03 DIAGNOSIS — Z1231 Encounter for screening mammogram for malignant neoplasm of breast: Secondary | ICD-10-CM | POA: Diagnosis not present

## 2020-01-10 ENCOUNTER — Ambulatory Visit (INDEPENDENT_AMBULATORY_CARE_PROVIDER_SITE_OTHER): Payer: PPO | Admitting: Internal Medicine

## 2020-01-10 ENCOUNTER — Other Ambulatory Visit: Payer: Self-pay

## 2020-01-10 VITALS — BP 152/88 | HR 74 | Temp 98.6°F | Ht 66.0 in | Wt 219.0 lb

## 2020-01-10 DIAGNOSIS — I10 Essential (primary) hypertension: Secondary | ICD-10-CM

## 2020-01-10 DIAGNOSIS — G4700 Insomnia, unspecified: Secondary | ICD-10-CM

## 2020-01-10 DIAGNOSIS — R739 Hyperglycemia, unspecified: Secondary | ICD-10-CM | POA: Diagnosis not present

## 2020-01-10 DIAGNOSIS — Z0000 Encounter for general adult medical examination without abnormal findings: Secondary | ICD-10-CM | POA: Diagnosis not present

## 2020-01-10 MED ORDER — TELMISARTAN-HCTZ 40-12.5 MG PO TABS: 1.0000 | ORAL_TABLET | Freq: Every day | ORAL | 3 refills | Status: DC

## 2020-01-10 MED ORDER — SYNTHROID 125 MCG PO TABS: 125.0000 ug | ORAL_TABLET | Freq: Every day | ORAL | 3 refills | Status: DC

## 2020-01-10 MED ORDER — TRAZODONE HCL 50 MG PO TABS: 50.0000 mg | ORAL_TABLET | Freq: Every evening | ORAL | 1 refills | Status: DC | PRN

## 2020-01-10 MED ORDER — ATORVASTATIN CALCIUM 20 MG PO TABS: 20.0000 mg | ORAL_TABLET | Freq: Every day | ORAL | 3 refills | Status: DC

## 2020-01-10 NOTE — Patient Instructions (Signed)
Please take all new medication as prescribed - the trazodone for sleep as needed  Please continue all other medications as before, and refills have been done if requested.  Please have the pharmacy call with any other refills you may need.  Please continue your efforts at being more active, low cholesterol diet, and weight control.  You are otherwise up to date with prevention measures today.  Please keep your appointments with your specialists as you may have planned  Please make an Appointment to return for your 1 year visit, or sooner if needed, with Lab testing by Appointment as well, to be done about 3-5 days before at the [facility] (so this is for TWO appointments - please see the scheduling desk as you leave)

## 2020-01-10 NOTE — Progress Notes (Signed)
Subjective:    Patient ID: Mary Ward, female    DOB: Apr 23, 1946, 74 y.o.   MRN: [id number]  HPI  Here for wellness and f/u;  Overall doing ok;  Pt denies Chest pain, worsening SOB, DOE, wheezing, orthopnea, PND, worsening LE edema, palpitations, dizziness or syncope.  Pt denies neurological change such as new headache, facial or extremity weakness.  Pt denies polydipsia, polyuria, or low sugar symptoms. Pt states overall good compliance with treatment and medications, good tolerability, and has been trying to follow appropriate diet.  Pt denies worsening depressive symptoms, suicidal ideation or panic. No fever, night sweats, wt loss, loss of appetite, or other constitutional symptoms.  Pt states good ability with ADL's, has low fall risk, home safety reviewed and adequate, no other significant changes in hearing or vision, and only occasionally active with exercise. No other new compalints.  BP at home usually < 140/90 but not checked recently. Has had recent constipation now improved. Sleep remains an issue, asks for med but nothing addictive Past Medical History:  Diagnosis Date  . Allergy    dust, dogs, grass  . Anxiety    no per pt  . Cataract    per pt "small one starting", no need for treatment at this time  . EN (erythema nodosum)   . GERD (gastroesophageal reflux disease)   . Hyperlipidemia   . Hyperplastic colonic polyp   . Hypertension   . Hypothyroidism   . Obesity    Past Surgical History:  Procedure Laterality Date  . BREAST REDUCTION SURGERY  2002  . BUNIONECTOMY     both feet  . COLONOSCOPY  01/22/2014  . CYST EXCISION  12/15/12   punch biopsy left shoulder blade  . HAMMER TOE SURGERY     right 2nd toe  . LAPAROSCOPIC ASSISTED VAGINAL HYSTERECTOMY  1992   with BSO  . OOPHORECTOMY    . TONSILLECTOMY      reports that she has never smoked. She has never used smokeless tobacco. She reports current alcohol use of about 1.0 standard drinks of alcohol per week. She  reports that she does not use drugs. family history includes Cancer in her father; Diabetes in her sister; Heart attack in her mother; Heart disease in her sister; Hyperlipidemia in her mother; Hypertension in her mother and sister; Stroke in her mother. Allergies  Allergen Reactions  . Indomethacin     Took with ASA and caused severe bloating  . Prednisone     Developed acne  . Prevnar 13 [Pneumococcal 13-Val Conj Vacc]     Arm became very sore and pt developed "fiery red knot" at site  . Tetanus Toxoid     Entire arm became swollen and turned red  . Tetracycline     "hot, red knots" below knees   Current Outpatient Medications on File Prior to Visit  Medication Sig Dispense Refill  . aspirin 81 MG tablet Take 81 mg by mouth daily.      . Black Pepper-Turmeric (TURMERIC COMPLEX/BLACK PEPPER PO)     . diphenhydrAMINE (BENADRYL) 12.5 MG/5ML elixir Take by mouth as needed for allergies.     No current facility-administered medications on file prior to visit.   Review of Systems All otherwise neg per pt     Objective:   Physical Exam BP (!) 152/88 (BP Location: Left Arm, Patient Position: Sitting, Cuff Size: Large)   Pulse 74   Temp 98.6 F (37 C) (Oral)   Ht 5\' 6"  (  1.676 m)   Wt 219 lb (99.3 kg)   LMP 09/13/1994   SpO2 96%   BMI 35.35 kg/m  VS noted,  Constitutional: Pt appears in NAD HENT: Head: NCAT.  Right Ear: External ear normal.  Left Ear: External ear normal.  Eyes: . Pupils are equal, round, and reactive to light. Conjunctivae and EOM are normal Nose: without d/c or deformity Neck: Neck supple. Gross normal ROM Cardiovascular: Normal rate and regular rhythm.   Pulmonary/Chest: Effort normal and breath sounds without rales or wheezing.  Abd:  Soft, NT, ND, + BS, no organomegaly Neurological: Pt is alert. At baseline orientation, motor grossly intact Skin: Skin is warm. No rashes, other new lesions, no LE edema Psychiatric: Pt behavior is normal without  agitation  All otherwise neg per pt Lab Results  Component Value Date   WBC 7.5 01/01/2020   HGB 14.1 01/01/2020   HCT 42.4 01/01/2020   PLT 158.0 01/01/2020   GLUCOSE 117 (H) 01/01/2020   CHOL 133 01/01/2020   TRIG 111.0 01/01/2020   HDL 46.20 01/01/2020   LDLCALC 64 01/01/2020   ALT 23 01/01/2020   AST 19 01/01/2020   NA 141 01/01/2020   K 4.0 01/01/2020   CL 104 01/01/2020   CREATININE 0.83 01/01/2020   BUN 14 01/01/2020   CO2 29 01/01/2020   TSH 1.61 01/01/2020   HGBA1C 6.2 01/01/2020      Assessment & Plan:

## 2020-01-12 ENCOUNTER — Encounter: Payer: Self-pay | Admitting: Internal Medicine

## 2020-01-12 DIAGNOSIS — G4700 Insomnia, unspecified: Secondary | ICD-10-CM | POA: Insufficient documentation

## 2020-01-12 NOTE — Assessment & Plan Note (Signed)
For trazodone qhs prn °

## 2020-01-12 NOTE — Assessment & Plan Note (Signed)
F/u bp at home and next visit, declines med change

## 2020-01-12 NOTE — Assessment & Plan Note (Signed)

## 2020-01-12 NOTE — Assessment & Plan Note (Signed)
stable overall by history and exam, recent data reviewed with pt, and pt to continue medical treatment as before,  to f/u any worsening symptoms or concerns  

## 2020-04-07 ENCOUNTER — Encounter (HOSPITAL_COMMUNITY): Payer: Self-pay | Admitting: Emergency Medicine

## 2020-04-07 ENCOUNTER — Emergency Department (HOSPITAL_COMMUNITY)
Admission: EM | Admit: 2020-04-07 | Discharge: 2020-04-07 | Disposition: A | Discharge status: Home / Self Care | Payer: PPO | Attending: Emergency Medicine | Admitting: Emergency Medicine

## 2020-04-07 ENCOUNTER — Ambulatory Visit (INDEPENDENT_AMBULATORY_CARE_PROVIDER_SITE_OTHER): Payer: PPO | Admitting: Physician Assistant

## 2020-04-07 ENCOUNTER — Telehealth: Payer: Self-pay | Admitting: Gastroenterology

## 2020-04-07 ENCOUNTER — Ambulatory Visit: Payer: PPO | Admitting: Internal Medicine

## 2020-04-07 ENCOUNTER — Encounter: Payer: Self-pay | Admitting: Physician Assistant

## 2020-04-07 ENCOUNTER — Telehealth: Payer: Self-pay | Admitting: Physician Assistant

## 2020-04-07 VITALS — BP 124/68 | HR 72 | Ht 65.0 in | Wt 193.4 lb

## 2020-04-07 DIAGNOSIS — R1030 Lower abdominal pain, unspecified: Secondary | ICD-10-CM | POA: Insufficient documentation

## 2020-04-07 DIAGNOSIS — R194 Change in bowel habit: Secondary | ICD-10-CM | POA: Diagnosis not present

## 2020-04-07 DIAGNOSIS — R141 Gas pain: Secondary | ICD-10-CM | POA: Insufficient documentation

## 2020-04-07 DIAGNOSIS — Z5321 Procedure and treatment not carried out due to patient leaving prior to being seen by health care provider: Secondary | ICD-10-CM | POA: Diagnosis not present

## 2020-04-07 DIAGNOSIS — R109 Unspecified abdominal pain: Secondary | ICD-10-CM | POA: Diagnosis present

## 2020-04-07 DIAGNOSIS — R1032 Left lower quadrant pain: Secondary | ICD-10-CM | POA: Diagnosis not present

## 2020-04-07 LAB — COMPREHENSIVE METABOLIC PANEL
ALT: 21 U/L (ref 0–44)
AST: 17 U/L (ref 15–41)
Albumin: 4.1 g/dL (ref 3.5–5.0)
Alkaline Phosphatase: 71 U/L (ref 38–126)
Anion gap: 13 (ref 5–15)
BUN: 13 mg/dL (ref 8–23)
CO2: 26 mmol/L (ref 22–32)
Calcium: 9.4 mg/dL (ref 8.9–10.3)
Chloride: 100 mmol/L (ref 98–111)
Creatinine, Ser: 0.69 mg/dL (ref 0.44–1.00)
GFR calc Af Amer: >60 mL/min (ref >60)
GFR calc non Af Amer: >60 mL/min (ref >60)
Glucose, Bld: 126 mg/dL — ABNORMAL HIGH (ref 70–99)
Potassium: 3.9 mmol/L (ref 3.5–5.1)
Sodium: 139 mmol/L (ref 135–145)
Total Bilirubin: 1.1 mg/dL (ref 0.3–1.2)
Total Protein: 7.4 g/dL (ref 6.5–8.1)

## 2020-04-07 LAB — CBC
HCT: 46.7 % — ABNORMAL HIGH (ref 36.0–46.0)
Hemoglobin: 15.1 g/dL — ABNORMAL HIGH (ref 12.0–15.0)
MCH: 28.1 pg (ref 26.0–34.0)
MCHC: 32.3 g/dL (ref 30.0–36.0)
MCV: 87 fL (ref 80.0–100.0)
Platelets: 155 10*3/uL (ref 150–400)
RBC: 5.37 MIL/uL — ABNORMAL HIGH (ref 3.87–5.11)
RDW: 12.9 % (ref 11.5–15.5)
WBC: 12.4 10*3/uL — ABNORMAL HIGH (ref 4.0–10.5)
nRBC: 0 % (ref 0.0–0.2)

## 2020-04-07 LAB — LIPASE, BLOOD: Lipase: 17 U/L (ref 11–51)

## 2020-04-07 MED ORDER — CIPROFLOXACIN HCL 500 MG PO TABS: 500.0000 mg | ORAL_TABLET | Freq: Two times a day (BID) | ORAL | 0 refills | Status: DC

## 2020-04-07 MED ORDER — METRONIDAZOLE 500 MG PO TABS: 500.0000 mg | ORAL_TABLET | Freq: Three times a day (TID) | ORAL | 0 refills | Status: DC

## 2020-04-07 MED ORDER — SODIUM CHLORIDE 0.9% FLUSH: 3.0000 mL | Freq: Once | INTRAVENOUS | Status: DC

## 2020-04-07 NOTE — Telephone Encounter (Signed)
Patient contacted.  She is in the ED.  She thanked me for the call.

## 2020-04-07 NOTE — Progress Notes (Signed)
Reviewed and agree with management plan.  Cobie Marcoux T. Yesika Rispoli, MD FACG [facility]  

## 2020-04-07 NOTE — ED Triage Notes (Signed)
Pt reports that she started having mid-lower abd pains that radiates to left side. Reports took 2 Senna and mag Citrate to try to see if that would help for possible constipation. Reports had little relief. Had "wet farts as my mother would say. Had mucousy when passed gas". Also had lots of gas upper and lower end.

## 2020-04-07 NOTE — Telephone Encounter (Signed)
Pt is requesting a call back from a nurse in regards to experiencing a bowel obstruction. Pt is requesting an appt to be seen today.

## 2020-04-07 NOTE — Telephone Encounter (Signed)
Opened in error

## 2020-04-07 NOTE — Progress Notes (Signed)
Chief Complaint: Abdominal pain and constipation  HPI:    Mary Ward is a 74 year old female with a past medical history as listed below, known to Dr. Fuller Plan, who presents to clinic today with a complaint of abdominal pain and constipation.      04/05/2019 colonoscopy with Dr. Fuller Plan for personal history of adenomatous polyps with 2 4 mm polyps in the rectum, mild diverticulosis in the left colon and internal hemorrhoids.  Pathology was benign.  Repeat recommended in 7 years.    Patient presented to the ER earlier this morning but left before being seen.  CBC with a white count of 12.4.  CMP with an elevated glucose at 126 and otherwise normal.  Lipase normal.    Patient seen as an urgent work in today.  Presents to clinic accompanied by her friend who does assist with her history.  She tells me that she just joined weight watchers sometime ago and with a diet change lower in carbs she does tend towards constipation.  On Saturday she started with a lower abdominal pressure/pain and took 2 senna thinking that she may be constipated and then some mag citrate and had a little flush of stool which relieved some pressure, but then she continued with pain on Sunday and into today and passed some liquid stool with mucus.  The pain has moved mostly into her left lower quadrant and she had "juicy poots" yesterday and increased gas, the gas has decreased some today and she has not had a real bowel movement.  Tells me that her last normal bowel movement she thought was on Friday evening.    Denies fever, chills, blood in her stool or weight loss.     Past Medical History:  Diagnosis Date  . Allergy    dust, dogs, grass  . Anxiety    no per pt  . Cataract    per pt "small one starting", no need for treatment at this time  . EN (erythema nodosum)   . GERD (gastroesophageal reflux disease)   . Hyperlipidemia   . Hyperplastic colonic polyp   . Hypertension   . Hypothyroidism   . Obesity     Past Surgical  History:  Procedure Laterality Date  . BREAST REDUCTION SURGERY  2002  . BUNIONECTOMY     both feet  . COLONOSCOPY  01/22/2014  . CYST EXCISION  12/15/12   punch biopsy left shoulder blade  . HAMMER TOE SURGERY     right 2nd toe  . LAPAROSCOPIC ASSISTED VAGINAL HYSTERECTOMY  1992   with BSO  . OOPHORECTOMY    . TONSILLECTOMY      Current Outpatient Medications  Medication Sig Dispense Refill  . aspirin 81 MG tablet Take 81 mg by mouth daily.      Marland Kitchen atorvastatin (LIPITOR) 20 MG tablet Take 1 tablet (20 mg total) by mouth daily. Annual appt due in APRIL must see provider for future refills 90 tablet 3  . Black Pepper-Turmeric (TURMERIC COMPLEX/BLACK PEPPER PO)     . diphenhydrAMINE (BENADRYL) 12.5 MG/5ML elixir Take by mouth as needed for allergies.    Marland Kitchen SYNTHROID 125 MCG tablet Take 1 tablet (125 mcg total) by mouth daily. DAW brand 90 tablet 3  . telmisartan-hydrochlorothiazide (MICARDIS HCT) 40-12.5 MG tablet Take 1 tablet by mouth daily. Annual appt due in APRIL must see provider for future refills 90 tablet 3  . traZODone (DESYREL) 50 MG tablet Take 1 tablet (50 mg total) by mouth at bedtime  as needed for sleep. 90 tablet 1   No current facility-administered medications for this visit.   Facility-Administered Medications Ordered in Other Visits  Medication Dose Route Frequency Provider Last Rate Last Admin  . sodium chloride flush (NS) 0.9 % injection 3 mL  3 mL Intravenous Once Lacretia Leigh, MD        Allergies as of 04/07/2020 - Review Complete 04/07/2020  Allergen Reaction Noted  . Indomethacin    . Prednisone    . Prevnar 13 [pneumococcal 13-val conj vacc]  01/08/2014  . Tetanus toxoid  08/07/2008  . Tetracycline      Family History  Problem Relation Age of Onset  . Hyperlipidemia Mother   . Stroke Mother   . Hypertension Mother   . Heart attack Mother   . Cancer Father        Lung  . Diabetes Sister   . Heart disease Sister        CAD  . Hypertension  Sister   . Colon cancer Neg Hx   . Esophageal cancer Neg Hx   . Rectal cancer Neg Hx   . Stomach cancer Neg Hx   . Colon polyps Neg Hx     Social History   Socioeconomic History  . Marital status: Married    Spouse name: Not on file  . Number of children: 0  . Years of education: Not on file  . Highest education level: Not on file  Occupational History  . Occupation: Psychologist, occupational: [employer]  Tobacco Use  . Smoking status: Never Smoker  . Smokeless tobacco: Never Used  Vaping Use  . Vaping Use: Never used  Substance and Sexual Activity  . Alcohol use: Yes    Alcohol/week: 1.0 standard drink    Types: 1 Standard drinks or equivalent per week    Comment: occ glass of wine  . Drug use: No  . Sexual activity: Never    Birth control/protection: Post-menopausal, Surgical    Comment: Hysterectomy  Other Topics Concern  . Not on file  Social History Narrative   Family history of high Cholesterol   Social Determinants of Health   Financial Resource Strain:   . Difficulty of Paying Living Expenses:   Food Insecurity:   . Worried About Charity fundraiser in the Last Year:   . Arboriculturist in the Last Year:   Transportation Needs:   . Film/video editor (Medical):   Marland Kitchen Lack of Transportation (Non-Medical):   Physical Activity:   . Days of Exercise per Week:   . Minutes of Exercise per Session:   Stress:   . Feeling of Stress :   Social Connections:   . Frequency of Communication with Friends and Family:   . Frequency of Social Gatherings with Friends and Family:   . Attends Religious Services:   . Active Member of Clubs or Organizations:   . Attends Archivist Meetings:   Marland Kitchen Marital Status:   Intimate Partner Violence:   . Fear of Current or Ex-Partner:   . Emotionally Abused:   Marland Kitchen Physically Abused:   . Sexually Abused:     Review of Systems:    Constitutional: No weight loss, fever or chills Cardiovascular: No chest  pain Respiratory: No SOB Gastrointestinal: See HPI and otherwise negative   Physical Exam:  Vital signs: BP 124/68   Pulse 72   Ht 5\' 5"  (1.651 m)   Wt 193 lb  6.4 oz (87.7 kg)   LMP 09/13/1994   SpO2 98%   BMI 32.18 kg/m   Constitutional:   Pleasant Caucasian female appears to be in NAD, Well developed, Well nourished, alert and cooperative Respiratory: Respirations even and unlabored. Lungs clear to auscultation bilaterally.   No wheezes, crackles, or rhonchi.  Cardiovascular: Normal S1, S2. No MRG. Regular rate and rhythm. No peripheral edema, cyanosis or pallor.  Gastrointestinal:  Soft, nondistended, marked TTP in left lower quadrant with involuntary guarding, rebound pain from the right lower quadrant with palpation, decreased bowel sounds all 4 quadrants. No appreciable masses or hepatomegaly. Rectal:  Not performed.  Psychiatric: Demonstrates good judgement and reason without abnormal affect or behaviors.  RELEVANT LABS AND IMAGING: CBC    Component Value Date/Time   WBC 12.4 (H) 04/07/2020 0911   RBC 5.37 (H) 04/07/2020 0911   HGB 15.1 (H) 04/07/2020 0911   HCT 46.7 (H) 04/07/2020 0911   PLT 155 04/07/2020 0911   MCV 87.0 04/07/2020 0911   MCH 28.1 04/07/2020 0911   MCHC 32.3 04/07/2020 0911   RDW 12.9 04/07/2020 0911   LYMPHSABS 2.8 01/01/2020 0837   MONOABS 0.5 01/01/2020 0837   EOSABS 0.2 01/01/2020 0837   BASOSABS 0.0 01/01/2020 0837    CMP     Component Value Date/Time   NA 139 04/07/2020 0911   K 3.9 04/07/2020 0911   CL 100 04/07/2020 0911   CO2 26 04/07/2020 0911   GLUCOSE 126 (H) 04/07/2020 0911   GLUCOSE 113 (H) 07/28/2006 0740   BUN 13 04/07/2020 0911   CREATININE 0.69 04/07/2020 0911   CALCIUM 9.4 04/07/2020 0911   PROT 7.4 04/07/2020 0911   ALBUMIN 4.1 04/07/2020 0911   AST 17 04/07/2020 0911   ALT 21 04/07/2020 0911   ALKPHOS 71 04/07/2020 0911   BILITOT 1.1 04/07/2020 0911   GFRNONAA >60 04/07/2020 0911   GFRAA >60 04/07/2020 0911     Assessment: 1.  Left lower quadrant pain: Pain initially in the lower abdomen, has now concentrated to the left lower quadrant on exam, white count minimally elevated in ER earlier this morning; suspect diverticulitis 2.  Change in bowel habits: Towards constipation; likely with diverticulitis above  Plan: 1.  Discussed with patient I think she has diverticulitis given significant pain on the left and tendency towards constipation over the past few days.  Patient insisted on having a CT scan though.  Ordered this stat to be done within the next 24 hours. 2.  Reviewed labs with the patient, she does have an elevated white count which goes with above. 3.  Prescribed Ciprofloxacin 500 mg twice daily, for 10 days #20 4.  Prescribed Metronidazole 500 mg 3 times daily x10 days #30 5.  Encouraged the patient to stay on a low fiber/ low residue diet for the next 10 days and then slowly increase to a high-fiber diet when she no longer has any pain. 6.  Explained to the patient that if CT shows something else we will call and let her know. 7.  Patient to follow in clinic with me in 6 weeks or sooner if necessary.  Mary Newer, PA-C [facility] 04/07/2020, 1:26 PM  Cc: Biagio Borg, MD

## 2020-04-07 NOTE — Patient Instructions (Addendum)
If you are age 74 or older, your body mass index should be between 23-30. Your Body mass index is 32.18 kg/m. If this is out of the aforementioned range listed, please consider follow up with your Primary Care Provider.  If you are age 14 or younger, your body mass index should be between 19-25. Your Body mass index is 32.18 kg/m. If this is out of the aformentioned range listed, please consider follow up with your Primary Care Provider.   We have sent the following medications to your pharmacy for you to pick up at your convenience: Cipro 500 mg twice a day for 10 days  Flagyl 500 mg three times daily for 10 days   Floraster once a day OTC   You have been scheduled for a CT scan of the abdomen and pelvis at [facility] ([address]---this is in the same building as [employer]).   You are scheduled on 04/08/26 at 3pm. You should arrive 15 minutes prior to your appointment time for registration. Please follow the written instructions below on the day of your exam:  WARNING: IF YOU ARE ALLERGIC TO IODINE/X-RAY DYE, PLEASE NOTIFY RADIOLOGY IMMEDIATELY AT [phone]! YOU WILL BE GIVEN A 13 HOUR PREMEDICATION PREP.  1) Do not eat or drink anything after 11am (4 hours prior to your test) 2) You have been given 2 bottles of oral contrast to drink. The solution may taste better if refrigerated, but do NOT add ice or any other liquid to this solution. Shake well before drinking.    Drink 1 bottle of contrast @1pm   (2 hours prior to your exam)  Drink 1 bottle of contrast @ 2pm (1 hour prior to your exam)  You may take any medications as prescribed with a small amount of water, if necessary. If you take any of the following medications: METFORMIN, GLUCOPHAGE, GLUCOVANCE, AVANDAMET, RIOMET, FORTAMET, Cole MET, JANUMET, GLUMETZA or METAGLIP, you MAY be asked to HOLD this medication 48 hours AFTER the exam.  The purpose of you drinking the oral contrast is to aid in the  visualization of your intestinal tract. The contrast solution may cause some diarrhea. Depending on your individual set of symptoms, you may also receive an intravenous injection of x-ray contrast/dye. Plan on being at [facility] for 30 minutes or longer, depending on the type of exam you are having performed.  This test typically takes 30-45 minutes to complete.  If you have any questions regarding your exam or if you need to reschedule, you may call the CT department at [phone] between the hours of 8:00 am and 5:00 pm, Monday-Friday.  ________________________________________________________________________

## 2020-04-08 ENCOUNTER — Ambulatory Visit (INDEPENDENT_AMBULATORY_CARE_PROVIDER_SITE_OTHER)
Admission: RE | Admit: 2020-04-08 | Discharge: 2020-04-08 | Disposition: A | Discharge status: Home / Self Care | Payer: PPO | Source: Ambulatory Visit | Attending: Physician Assistant | Admitting: Physician Assistant

## 2020-04-08 ENCOUNTER — Other Ambulatory Visit: Payer: Self-pay

## 2020-04-08 DIAGNOSIS — K5732 Diverticulitis of large intestine without perforation or abscess without bleeding: Secondary | ICD-10-CM | POA: Diagnosis not present

## 2020-04-08 DIAGNOSIS — R1032 Left lower quadrant pain: Secondary | ICD-10-CM | POA: Diagnosis not present

## 2020-04-08 DIAGNOSIS — R194 Change in bowel habit: Secondary | ICD-10-CM

## 2020-04-08 IMAGING — CT CT ABD-PELV W/ CM
2 of 5 series · 14 of 46 positions shown, 16 images · IV contrast (OMNIPAQUE 300)
Comparison: None.

CLINICAL DATA: Left lower quadrant abdominal pain. Change in bowel
habits. History of diverticulosis.

EXAM:
CT ABDOMEN AND PELVIS WITH CONTRAST
TECHNIQUE: Multidetector CT imaging of the abdomen and pelvis was performed
using the standard protocol following bolus administration of
intravenous contrast.
CONTRAST:  100mL OMNIPAQUE IOHEXOL 300 MG/ML  SOLN

[Series 2: abd/pel w · axial · 0.74mm/px · z∈[-494,-84]mm · 11 of 94 slices shown, 13 images]
[im 6/94  soft-tissue]
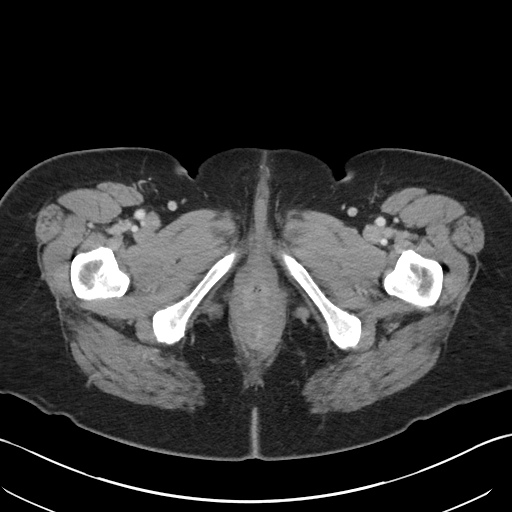
[im 6/94  bone]
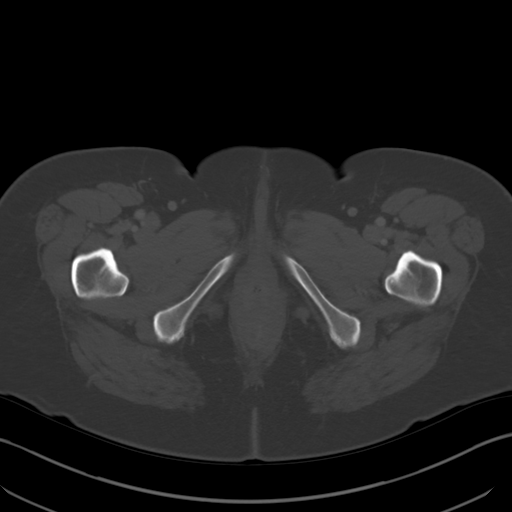
[im 16/94  soft-tissue]
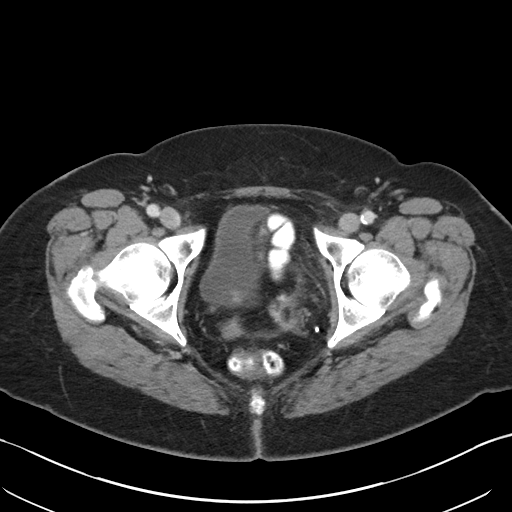
[im 21/94  soft-tissue]
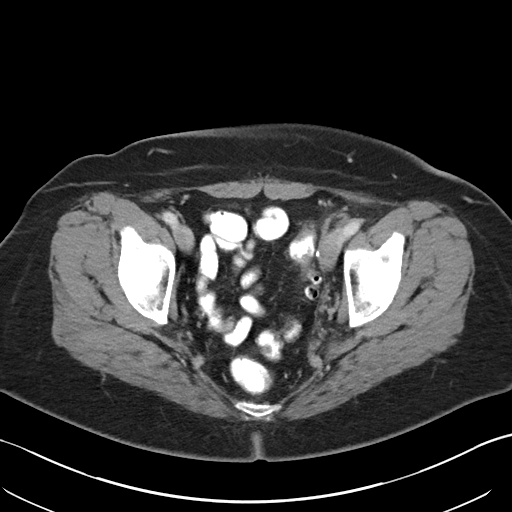
[im 32/94  soft-tissue]
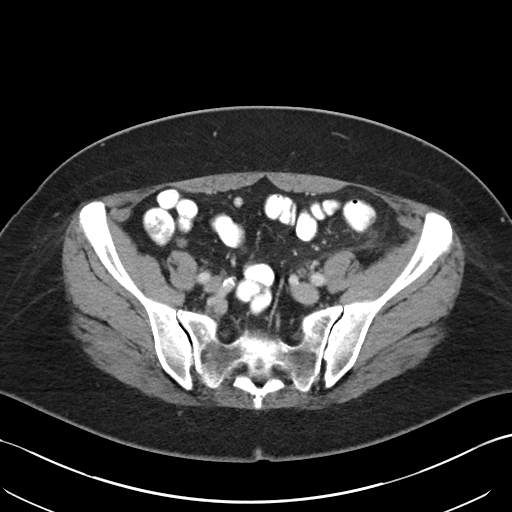
[im 37/94  soft-tissue]
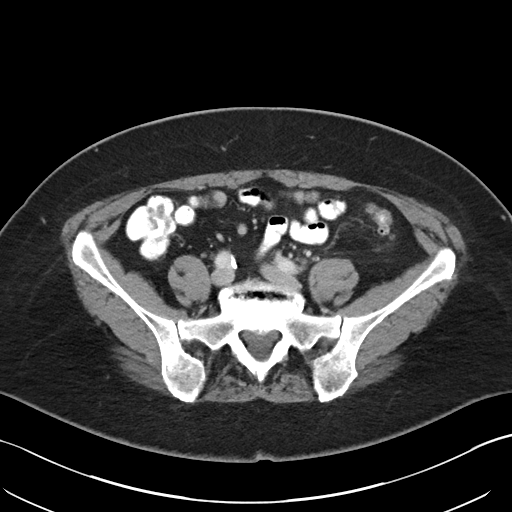
[im 47/94  soft-tissue]
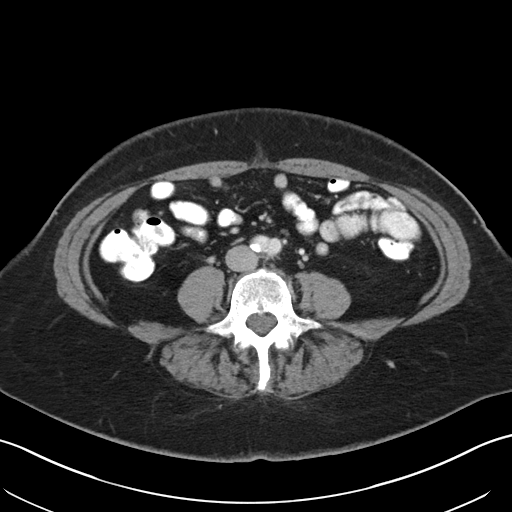
[im 57/94  soft-tissue]
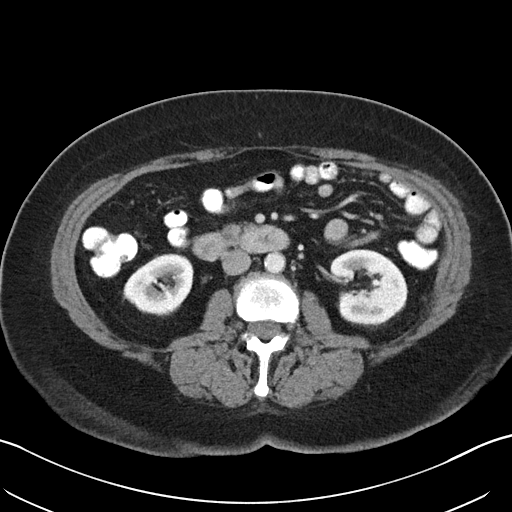
[im 63/94  soft-tissue]
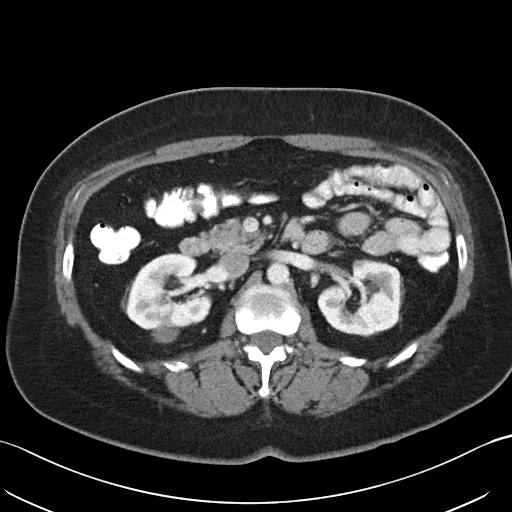
[im 73/94  soft-tissue]
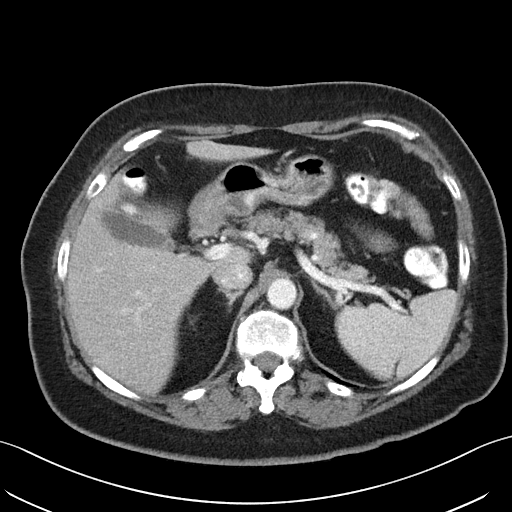
[im 73/94  bone]
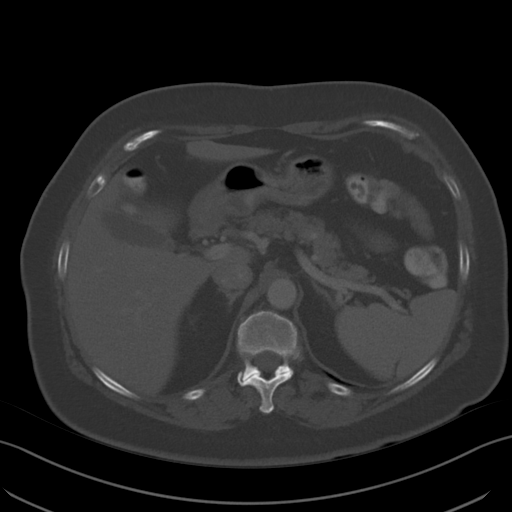
[im 78/94  soft-tissue]
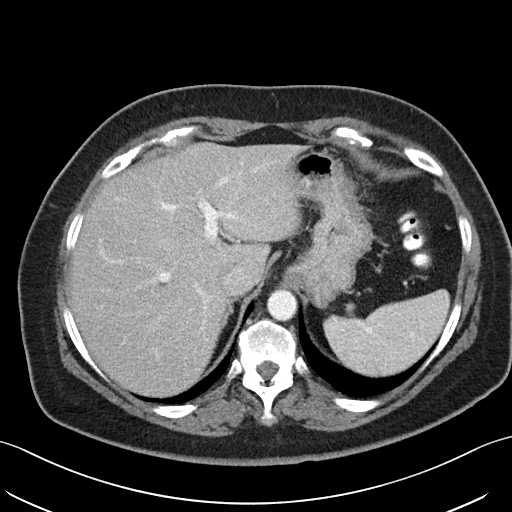
[im 88/94  soft-tissue]
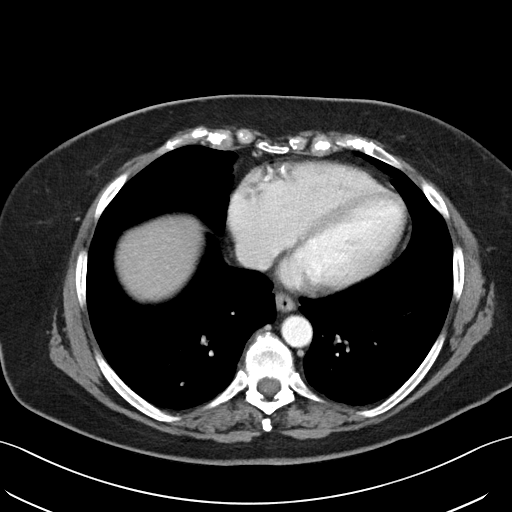

[Series 5: abd/pel w st · coronal · 0.75mm/px · 3 of 73 slices shown]
[im 25/73  soft-tissue]
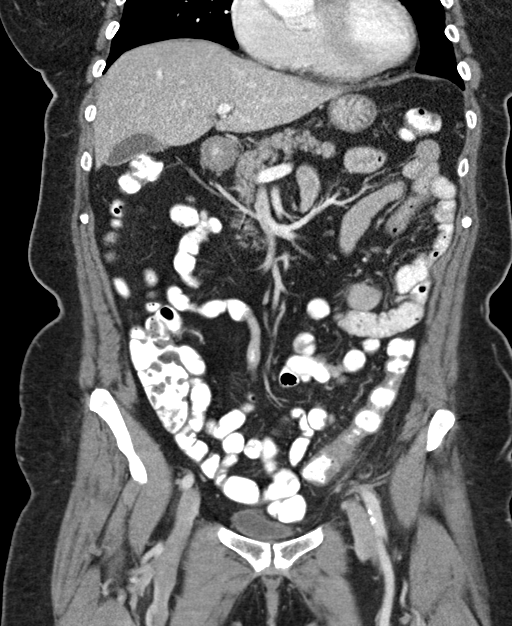
[im 33/73  soft-tissue]
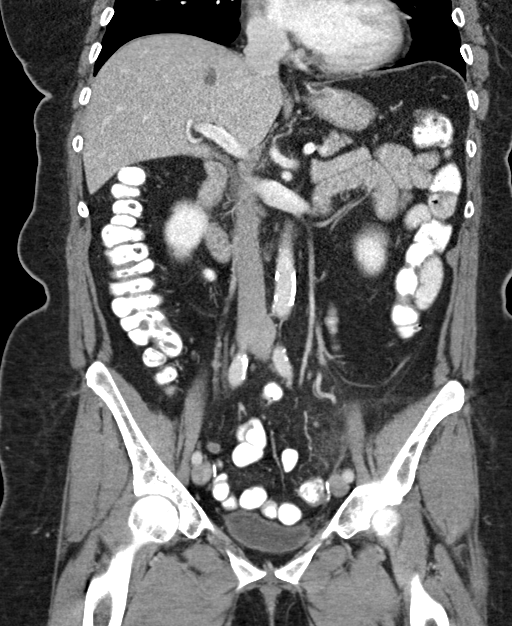
[im 41/73  soft-tissue]
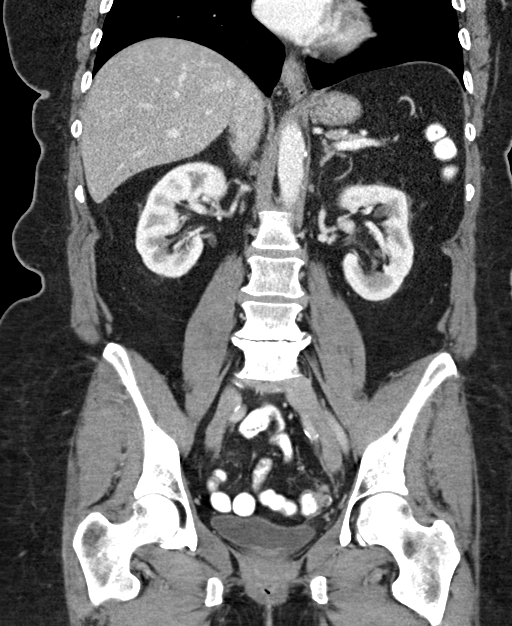

[14 of 46 positions shown; findings below may reference images not displayed]

FINDINGS: Lower chest: Minor hypoventilatory change in the dependent lungs. 4
mm subpleural nodule in the right lower lobe, series 3, image 5.
This is unchanged from [DATE] chest CT and considered benign.

Hepatobiliary: Diffusely decreased hepatic density consistent with
steatosis. There is scattered small low-density lesions throughout
the liver majority of which are too small to characterize. Largest
lesion is in the right lobe measuring 14 mm. The included lesions
are stable from chest CT [DATE]. 8 mm enhancing focus in the
right lobe, series 2, image 14. Gallbladder physiologically
distended, no calcified stone. No biliary dilatation.

Pancreas: No ductal dilatation or inflammation.

Spleen: Normal in size without focal abnormality. Central splenic
cleft.

Adrenals/Urinary Tract: Normal adrenal glands. Indeterminate
exophytic lesion from the posterior mid right kidney measuring
cm primarily hypodense with peripheral calcification. Possible
septation +/-enhancement or peripheral enhancement. Small
subcentimeter hypodensity in the left kidney is too small to
characterize but likely cyst. No hydronephrosis or perinephric
edema. Homogeneous renal enhancement with symmetric excretion on
delayed phase imaging. Urinary bladder is physiologically distended
without wall thickening.

Stomach/Bowel: Inflamed diverticulum involving the proximal sigmoid
colon in the left lower quadrant with fat stranding and pericolonic
edema, series 2, image 70. There is no perforation or abscess.
Multiple additional noninflamed diverticula in the distal colon. No
obstruction, administered enteric contrast is seen throughout the
colon. Unremarkable small bowel. Appendix is air-filled without
inflammation. Small hiatal hernia, stomach otherwise unremarkable.

Vascular/Lymphatic: Aortic atherosclerosis. No aortic aneurysm.
Portal vein is patent. No enlarged lymph nodes in the abdomen or
pelvis.

Reproductive: Status post hysterectomy. No adnexal masses.

Other: No free air, free fluid, or intra-abdominal fluid collection.
Fat stranding and inflammatory change in the left lower quadrant
related to diverticulitis.

Musculoskeletal: There are no acute or suspicious osseous
abnormalities. Degenerative disc disease in the lower lumbar spine.
IMPRESSION: 1. Uncomplicated acute diverticulitis of the proximal sigmoid colon.
No perforation or abscess.
2. Hepatic steatosis. Scattered low-density lesions throughout the
liver, majority of which are too small to characterize,
statistically likely cysts. There is a 8 mm enhancing focus in the
right lobe of the liver which is nonspecific. In the absence of
known malignancy, this is likely a flash filling hemangioma. If
there is a history of malignancy, recommend hepatic protocol MRI for
characterization.
3. Indeterminate exophytic lesion from the posterior mid right
kidney measuring 2.1 cm with peripheral calcification, possible
peripheral enhancement. Recommend further characterization with
renal protocol MRI on an elective basis.
4. Small hiatal hernia.

Aortic Atherosclerosis ([22]-[22]).

These results will be called to the ordering clinician or
representative by the Radiologist Assistant, and communication
documented in the PACS or [REDACTED].

## 2020-04-08 MED ORDER — IOHEXOL 300 MG/ML  SOLN
100.0000 mL | Freq: Once | INTRAMUSCULAR | Status: AC | PRN
  Administered 2020-04-08: 100 mL via INTRAVENOUS

## 2020-04-11 ENCOUNTER — Ambulatory Visit (INDEPENDENT_AMBULATORY_CARE_PROVIDER_SITE_OTHER): Payer: PPO | Admitting: Internal Medicine

## 2020-04-11 ENCOUNTER — Encounter: Payer: Self-pay | Admitting: Internal Medicine

## 2020-04-11 ENCOUNTER — Other Ambulatory Visit: Payer: Self-pay

## 2020-04-11 DIAGNOSIS — R739 Hyperglycemia, unspecified: Secondary | ICD-10-CM | POA: Diagnosis not present

## 2020-04-11 DIAGNOSIS — I10 Essential (primary) hypertension: Secondary | ICD-10-CM

## 2020-04-11 DIAGNOSIS — N2889 Other specified disorders of kidney and ureter: Secondary | ICD-10-CM | POA: Diagnosis not present

## 2020-04-11 DIAGNOSIS — K5792 Diverticulitis of intestine, part unspecified, without perforation or abscess without bleeding: Secondary | ICD-10-CM | POA: Diagnosis not present

## 2020-04-11 NOTE — Patient Instructions (Addendum)
Please continue all other medications as before, and refills have been done if requested.  Please have the pharmacy call with any other refills you may need.  Please continue your efforts at being more active, low cholesterol diet, and weight control.  Please keep your appointments with your specialists as you may have planned  You will be contacted regarding the referral for: MRI abdomen  You will be contacted regarding the referral for: urology - Dr Ron Agee

## 2020-04-11 NOTE — Progress Notes (Signed)
Subjective:    Patient ID: Mary Ward, female    DOB: 1945/12/27, 74 y.o.   MRN: [id number]  HPI  Here to f/u recent acute sigmoid diverticulitis, denies fever, and Denies worsening reflux, abd pain, dysphagia, n/v, bowel change or blood. Pain overall very mild only.  Good compliance with meds.  Pt denies chest pain, increased sob or doe, wheezing, orthopnea, PND, increased LE swelling, palpitations, dizziness or syncope.  Pt denies new neurological symptoms such as new headache, or facial or extremity weakness or numbness   Pt denies polydipsia, polyuria, or low sugar symptoms such as weakness or confusion improved with po intake.  Pt states overall good compliance with meds, trying to follow lower cholesterol, diabetic diet, wt overall stable but little exercise however.   Overall has Lost 25 lbs with better diet and exercise.  ALso recent CT with calcified right renal mass. Denies urinary symptoms such as dysuria, frequency, urgency, flank pain, hematuria or n/v, fever, chills.  Past Medical History:  Diagnosis Date  . Allergy    dust, dogs, grass  . Anxiety    no per pt  . Cataract    per pt "small one starting", no need for treatment at this time  . EN (erythema nodosum)   . GERD (gastroesophageal reflux disease)   . Hyperlipidemia   . Hyperplastic colonic polyp   . Hypertension   . Hypothyroidism   . Obesity    Past Surgical History:  Procedure Laterality Date  . BREAST REDUCTION SURGERY  2002  . BUNIONECTOMY     both feet  . COLONOSCOPY  01/22/2014  . CYST EXCISION  12/15/12   punch biopsy left shoulder blade  . HAMMER TOE SURGERY     right 2nd toe  . LAPAROSCOPIC ASSISTED VAGINAL HYSTERECTOMY  1992   with BSO  . OOPHORECTOMY    . TONSILLECTOMY      reports that she has never smoked. She has never used smokeless tobacco. She reports current alcohol use of about 1.0 standard drink of alcohol per week. She reports that she does not use drugs. family history includes Cancer  in her father; Diabetes in her sister; Heart attack in her mother; Heart disease in her sister; Hyperlipidemia in her mother; Hypertension in her mother and sister; Stroke in her mother. Allergies  Allergen Reactions  . Indomethacin     Took with ASA and caused severe bloating  . Prednisone     Developed acne  . Prevnar 13 [Pneumococcal 13-Val Conj Vacc]     Arm became very sore and pt developed "fiery red knot" at site  . Tetanus Toxoid     Entire arm became swollen and turned red  . Tetracycline     "hot, red knots" below knees   Current Outpatient Medications on File Prior to Visit  Medication Sig Dispense Refill  . aspirin 81 MG tablet Take 81 mg by mouth daily.      Marland Kitchen atorvastatin (LIPITOR) 20 MG tablet Take 1 tablet (20 mg total) by mouth daily. Annual appt due in APRIL must see provider for future refills 90 tablet 3  . Black Pepper-Turmeric (TURMERIC COMPLEX/BLACK PEPPER PO)     . ciprofloxacin (CIPRO) 500 MG tablet Take 1 tablet (500 mg total) by mouth 2 (two) times daily. 20 tablet 0  . metroNIDAZOLE (FLAGYL) 500 MG tablet Take 1 tablet (500 mg total) by mouth 3 (three) times daily. 30 tablet 0  . SYNTHROID 125 MCG tablet Take 1 tablet (  125 mcg total) by mouth daily. DAW brand 90 tablet 3  . telmisartan-hydrochlorothiazide (MICARDIS HCT) 40-12.5 MG tablet Take 1 tablet by mouth daily. Annual appt due in APRIL must see provider for future refills 90 tablet 3  . traZODone (DESYREL) 50 MG tablet Take 1 tablet (50 mg total) by mouth at bedtime as needed for sleep. 90 tablet 1   No current facility-administered medications on file prior to visit.   Review of Systems All otherwise neg per pt     Objective:   Physical Exam BP (!) 136/80 (BP Location: Left Arm, Patient Position: Sitting, Cuff Size: Large)   Pulse 66   Temp 98 F (36.7 C) (Oral)   Ht 5\' 5"  (1.651 m)   Wt 195 lb (88.5 kg)   LMP 09/13/1994   SpO2 95%   BMI 32.45 kg/m  VS noted,  Constitutional: Pt appears  in NAD HENT: Head: NCAT.  Right Ear: External ear normal.  Left Ear: External ear normal.  Eyes: . Pupils are equal, round, and reactive to light. Conjunctivae and EOM are normal Nose: without d/c or deformity Neck: Neck supple. Gross normal ROM Cardiovascular: Normal rate and regular rhythm.   Pulmonary/Chest: Effort normal and breath sounds without rales or wheezing.  Abd:  Soft, NT, ND, + BS, no organomegaly Neurological: Pt is alert. At baseline orientation, motor grossly intact Skin: Skin is warm. No rashes, other new lesions, no LE edema Psychiatric: Pt behavior is normal without agitation  All otherwise neg per pt Lab Results  Component Value Date   WBC 12.4 (H) 04/07/2020   HGB 15.1 (H) 04/07/2020   HCT 46.7 (H) 04/07/2020   PLT 155 04/07/2020   GLUCOSE 126 (H) 04/07/2020   CHOL 133 01/01/2020   TRIG 111.0 01/01/2020   HDL 46.20 01/01/2020   LDLCALC 64 01/01/2020   ALT 21 04/07/2020   AST 17 04/07/2020   NA 139 04/07/2020   K 3.9 04/07/2020   CL 100 04/07/2020   CREATININE 0.69 04/07/2020   BUN 13 04/07/2020   CO2 26 04/07/2020   TSH 1.61 01/01/2020   HGBA1C 6.2 01/01/2020        Assessment & Plan:

## 2020-04-13 ENCOUNTER — Encounter: Payer: Self-pay | Admitting: Internal Medicine

## 2020-04-13 NOTE — Assessment & Plan Note (Signed)
stable overall by history and exam, recent data reviewed with pt, and pt to continue medical treatment as before,  to f/u any worsening symptoms or concerns  

## 2020-04-13 NOTE — Assessment & Plan Note (Addendum)
Resolving, to finish antibx,  to f/u any worsening symptoms or concerns  I spent 31 minutes in preparing to see the patient by review of recent labs, imaging and procedures, obtaining and reviewing separately obtained history, communicating with the patient and family or caregiver, ordering medications, tests or procedures, and documenting clinical information in the EHR including the differential Dx, treatment, and any further evaluation and other management of acute diverticulitis, htn, hyperglycemia, right renal mass

## 2020-04-13 NOTE — Assessment & Plan Note (Signed)
Likely benign if calcified, but will need MRI abd and urology referral

## 2020-04-23 DIAGNOSIS — H40053 Ocular hypertension, bilateral: Secondary | ICD-10-CM | POA: Diagnosis not present

## 2020-04-23 DIAGNOSIS — H40019 Open angle with borderline findings, low risk, unspecified eye: Secondary | ICD-10-CM | POA: Diagnosis not present

## 2020-04-23 DIAGNOSIS — H524 Presbyopia: Secondary | ICD-10-CM | POA: Diagnosis not present

## 2020-04-23 DIAGNOSIS — H40059 Ocular hypertension, unspecified eye: Secondary | ICD-10-CM | POA: Diagnosis not present

## 2020-04-23 DIAGNOSIS — H5203 Hypermetropia, bilateral: Secondary | ICD-10-CM | POA: Diagnosis not present

## 2020-05-12 ENCOUNTER — Other Ambulatory Visit: Payer: Self-pay

## 2020-05-12 ENCOUNTER — Ambulatory Visit
Admission: RE | Admit: 2020-05-12 | Discharge: 2020-05-12 | Disposition: A | Discharge status: Home / Self Care | Payer: PPO | Source: Ambulatory Visit | Attending: Internal Medicine | Admitting: Internal Medicine

## 2020-05-12 DIAGNOSIS — N281 Cyst of kidney, acquired: Secondary | ICD-10-CM | POA: Diagnosis not present

## 2020-05-12 DIAGNOSIS — K8689 Other specified diseases of pancreas: Secondary | ICD-10-CM | POA: Diagnosis not present

## 2020-05-12 DIAGNOSIS — K862 Cyst of pancreas: Secondary | ICD-10-CM | POA: Diagnosis not present

## 2020-05-12 DIAGNOSIS — N2889 Other specified disorders of kidney and ureter: Secondary | ICD-10-CM

## 2020-05-12 IMAGING — MR MR ABDOMEN WO/W CM
16 of 17 series · 45 of 48 positions shown · IV contrast (multihance)
Comparison: CT abdomen/pelvis [DATE]

CLINICAL DATA: Renal mass on CT scan.

EXAM:
MRI ABDOMEN WITHOUT AND WITH CONTRAST
TECHNIQUE: Multiplanar multisequence MR imaging of the abdomen was performed
both before and after the administration of intravenous contrast.
CONTRAST:  18mL MULTIHANCE GADOBENATE DIMEGLUMINE 529 MG/ML IV SOLN

[Series 3: T2 · coronal · 5.0mm · 0.78mm/px · 1 of 16 slices shown (1 of 3)]
[im 1/16]
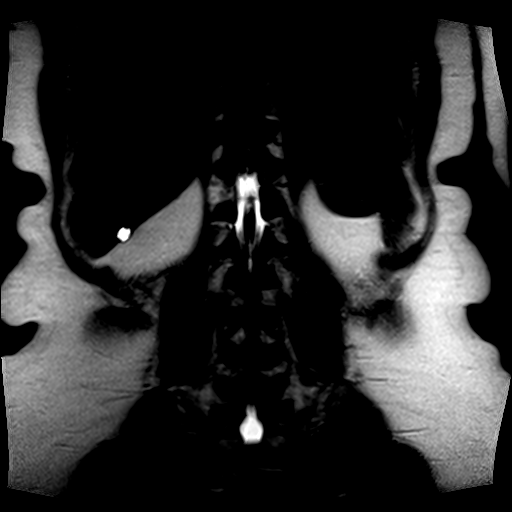

[Series 6: T2 · axial · 6.0mm · 1.09mm/px · z∈[-69,+175]mm · 2 of 35 slices shown (2 of 3)]
[im 1/35]
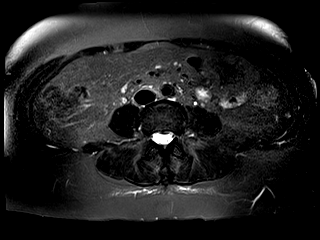
[im 35/35]
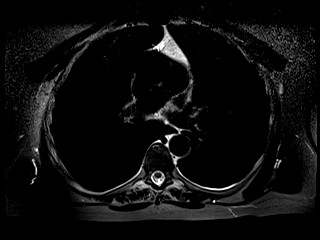

[Series 7: T2 · axial · 5.0mm · 0.66mm/px · z∈[-96,+96]mm · 2 of 33 slices shown (3 of 3)]
[im 1/33]
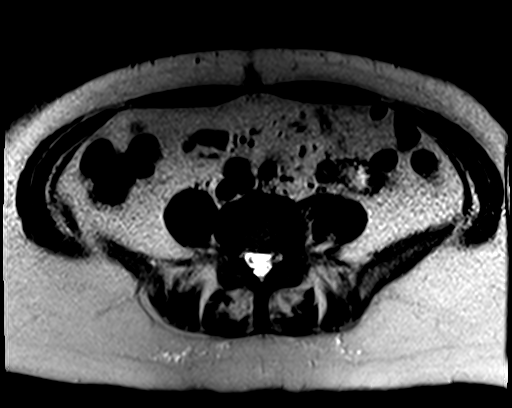
[im 33/33]
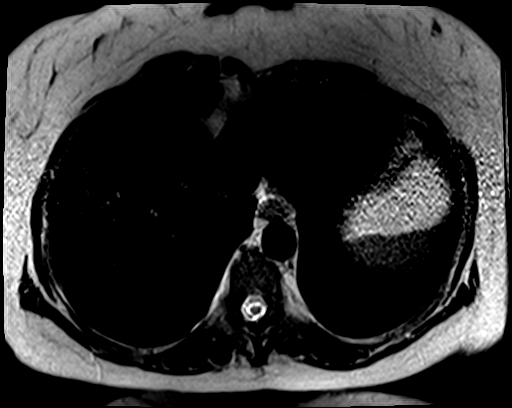

[Series 8: ep2d_diff_b50_500_800_p2_trig · axial · 6.0mm · 1.82mm/px · z∈[-69,+175]mm · 6 of 105 slices shown]
[im 1/105]
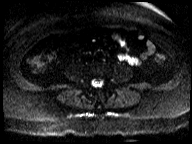
[im 21/105]
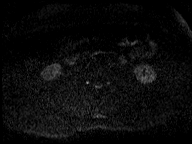
[im 42/105]
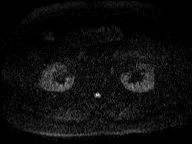
[im 63/105]
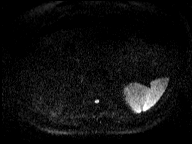
[im 84/105]
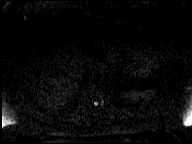
[im 105/105]
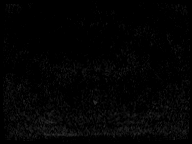

[Series 9: ep2d_diff_b50_500_800_p2_trig_adc · axial · 6.0mm · 1.82mm/px · z∈[-69,+175]mm · 2 of 35 slices shown]
[im 1/35]
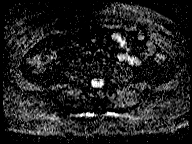
[im 35/35]
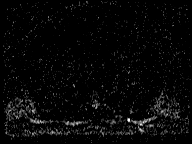

[Series 10: bSSFP · axial · 4.0mm · 0.74mm/px · z∈[-76,+76]mm · 2 of 39 slices shown]
[im 1/39]
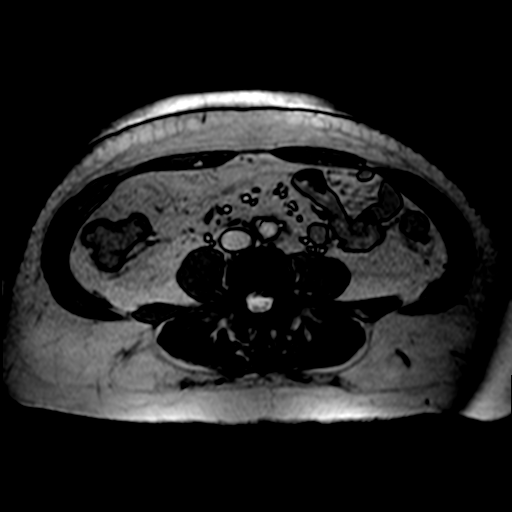
[im 39/39]
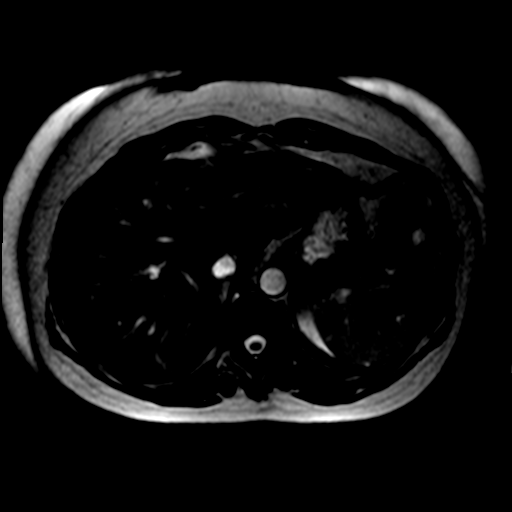

[Series 11: T1 · axial · 5.0mm · 0.66mm/px · z∈[-72,+72]mm · 3 of 50 slices shown]
[im 1/50]
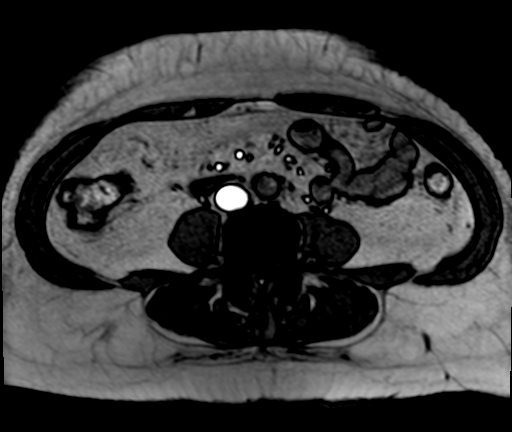
[im 25/50]
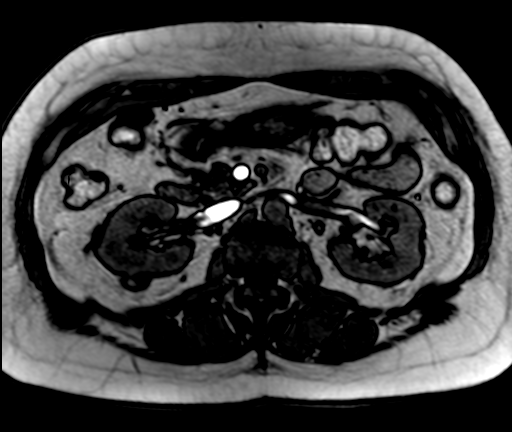
[im 50/50]
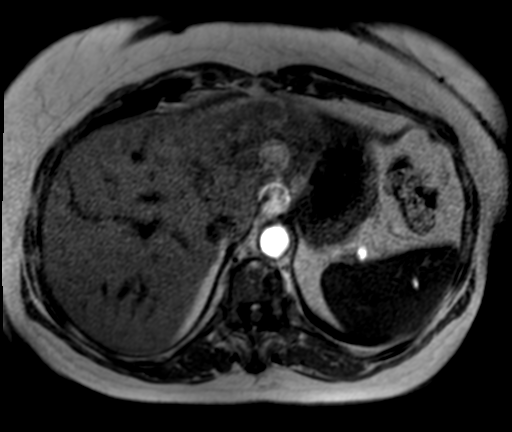

[Series 12: T1 dynamic · axial · non-contrast · 2.3mm · 1.41mm/px · z∈[-76,+60]mm · 3 of 60 slices shown]
[im 1/60]
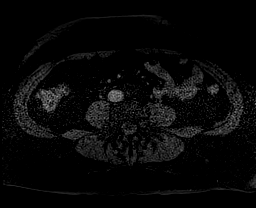
[im 30/60]
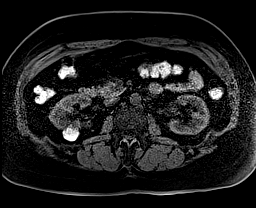
[im 60/60]
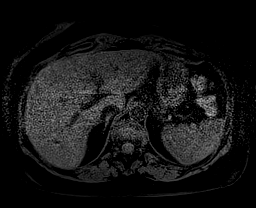

[Series 13: post 25 sec · axial · 2.3mm · 1.41mm/px · z∈[-76,+60]mm · 3 of 60 slices shown]
[im 1/60]
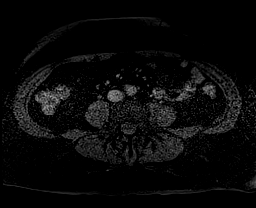
[im 30/60]
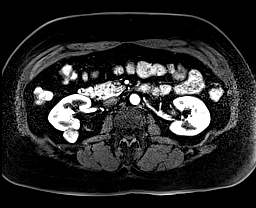
[im 60/60]
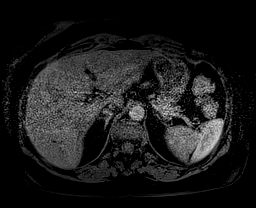

[Series 14: post 25 sec_sub · axial · 2.3mm · 1.41mm/px · z∈[-76,+60]mm · 3 of 60 slices shown]
[im 1/60]
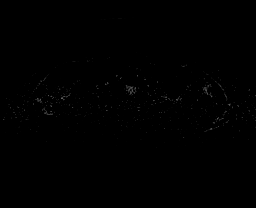
[im 30/60]
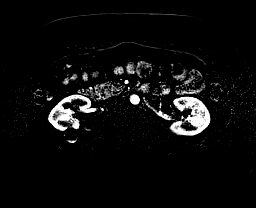
[im 60/60]
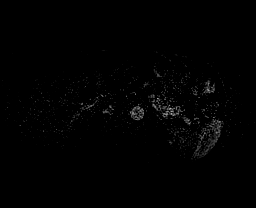

[Series 15: post 45 sec · axial · 2.3mm · 1.41mm/px · z∈[-76,+60]mm · 3 of 60 slices shown]
[im 1/60]
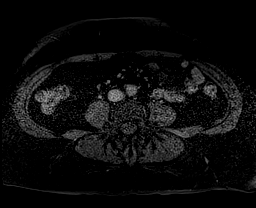
[im 30/60]
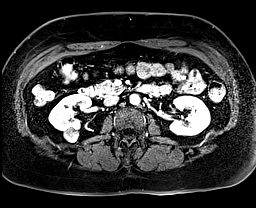
[im 60/60]
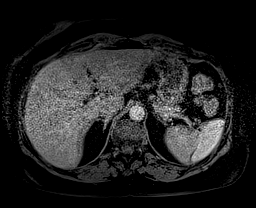

[Series 16: post 45 sec_sub · axial · 2.3mm · 1.41mm/px · z∈[-76,+60]mm · 3 of 60 slices shown]
[im 1/60]
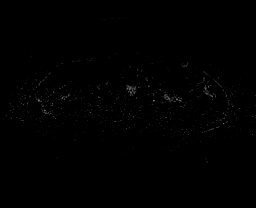
[im 30/60]
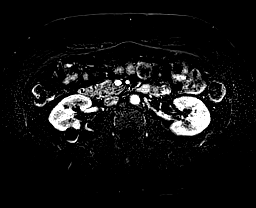
[im 60/60]
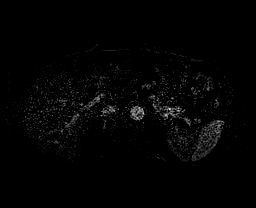

[Series 17: post 90 sec · axial · 2.3mm · 1.41mm/px · z∈[-76,+60]mm · 3 of 60 slices shown]
[im 1/60]
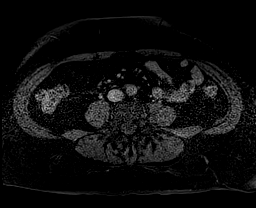
[im 30/60]
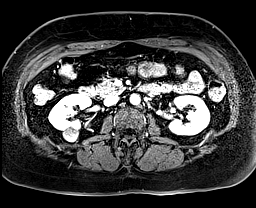
[im 60/60]
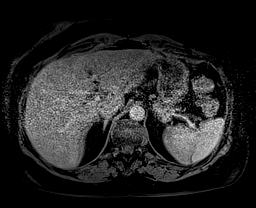

[Series 18: post 90 sec_sub · axial · 2.3mm · 1.41mm/px · z∈[-76,+60]mm · 3 of 60 slices shown]
[im 1/60]
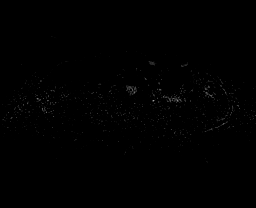
[im 30/60]
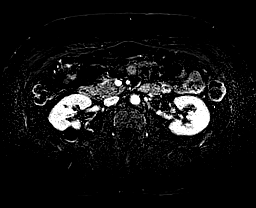
[im 60/60]
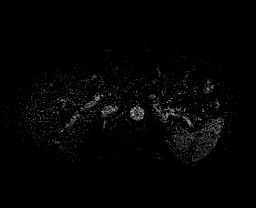

[Series 19: T1 dynamic post-contrast · coronal · 2.6mm · 0.78mm/px · 3 of 52 slices shown]
[im 1/52]
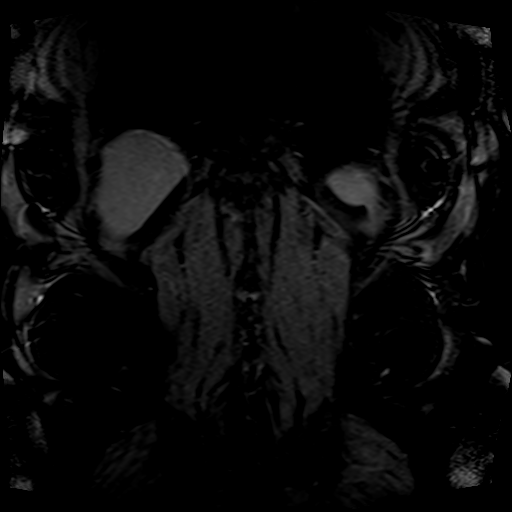
[im 26/52]
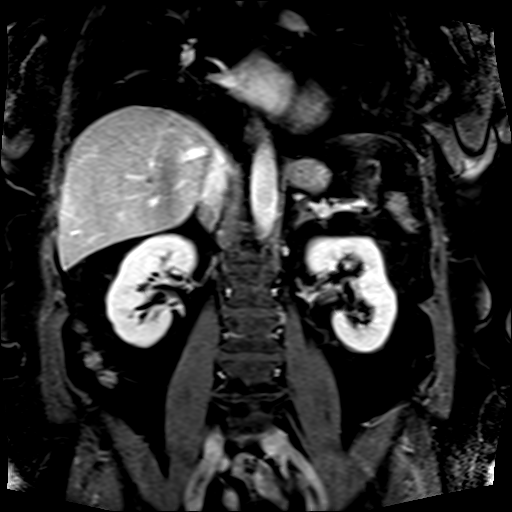
[im 52/52]
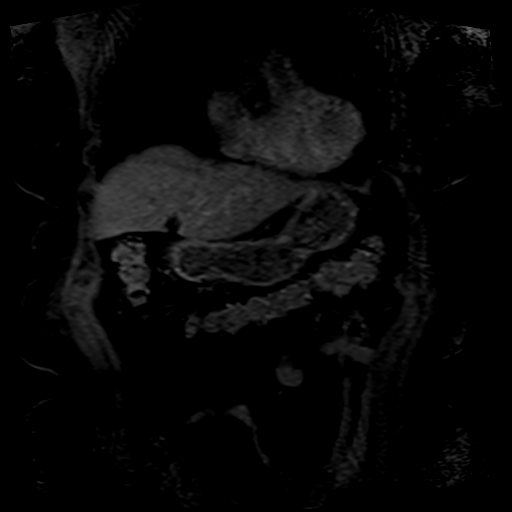

[Series 20: post axial 3+ · axial · 2.3mm · 1.41mm/px · z∈[-76,+60]mm · 3 of 60 slices shown]
[im 1/60]
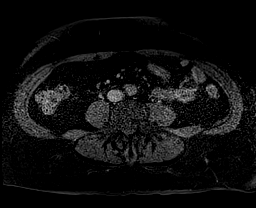
[im 30/60]
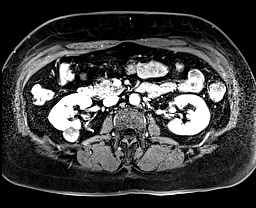
[im 60/60]
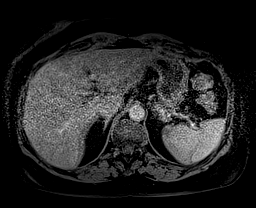

[45 of 48 positions shown; findings below may reference images not displayed]

FINDINGS: Lower chest: Unremarkable.

Hepatobiliary: Scattered tiny T2 hyperintense nonenhancing lesions
in the liver parenchyma are noted. While some of these are too small
to characterize, these are most likely benign and likely represent
cysts. There is no evidence for gallstones, gallbladder wall
thickening, or pericholecystic fluid. No intrahepatic or
extrahepatic biliary dilation.

Pancreas: 6 mm cystic lesion identified in the head of pancreas
(axial T2 27/series 6). Pancreas otherwise unremarkable.

Spleen:  No splenomegaly. No focal mass lesion.

Adrenals/Urinary Tract:  No adrenal nodule or mass.

2.0 x 2.4 x 2.4 cm exophytic lesion identified posterior interpolar
right kidney, as noted on recent CT scan. This lesion has relatively
homogeneous low signal intensity on T2 imaging with high signal
intensity on T1 imaging. Posteriorly, there is a crescent of more
prominent T1 shortening suggesting layering proteinaceous debris on
a background of hemorrhage/protein content. Lesion was noted to have
posterior rim calcification on the recent CT scan. No appreciable
enhancement in this lesion with some misregistration noted
posteriorly on subtraction imaging (misregistration noted at the
inferior pole of each kidney and inferior liver as well).

Tiny cyst noted left kidney without suspicious left renal mass
lesion.

Stomach/Bowel: Stomach is unremarkable. No gastric wall thickening.
No evidence of outlet obstruction. Duodenum is normally positioned
as is the ligament of Treitz. No small bowel or colonic dilatation
within the visualized abdomen.

Vascular/Lymphatic: No abdominal aortic aneurysm. No abdominal
lymphadenopathy

Other:  No intraperitoneal free fluid.

Musculoskeletal: No focal suspicious marrow enhancement within the
visualized bony anatomy.
IMPRESSION: 1. 2.0 x 2.4 x 2.4 cm exophytic lesion in the posterior interpolar
right kidney shows internal signal characteristics suggesting
complex fluid such as hemorrhage or proteinaceous debris. Posterior
rim calcification noted on recent CT scan. MR imaging features
today most compatible with a Bosniak II benign cyst.
2. 6 mm cystic lesion in the head of pancreas. This is likely
benign/indolent. Consensus guidelines recommend repeat MRI in 2
years given patient age and lesion size. This recommendation follows
ACR consensus guidelines: Management of Incidental Pancreatic Cysts:
A White Paper of the ACR Incidental Findings Committee. [HOSPITAL] [K8];[DATE].

## 2020-05-12 MED ORDER — GADOBENATE DIMEGLUMINE 529 MG/ML IV SOLN
18.0000 mL | Freq: Once | INTRAVENOUS | Status: AC | PRN
  Administered 2020-05-12: 18 mL via INTRAVENOUS

## 2020-05-20 ENCOUNTER — Encounter: Payer: Self-pay | Admitting: Internal Medicine

## 2020-05-21 ENCOUNTER — Encounter: Payer: Self-pay | Admitting: Internal Medicine

## 2020-07-02 ENCOUNTER — Other Ambulatory Visit: Payer: Self-pay

## 2020-07-02 ENCOUNTER — Ambulatory Visit (INDEPENDENT_AMBULATORY_CARE_PROVIDER_SITE_OTHER): Payer: PPO | Admitting: *Deleted

## 2020-07-02 DIAGNOSIS — Z23 Encounter for immunization: Secondary | ICD-10-CM

## 2020-07-02 NOTE — Progress Notes (Signed)
[id number]

## 2020-07-07 ENCOUNTER — Ambulatory Visit (INDEPENDENT_AMBULATORY_CARE_PROVIDER_SITE_OTHER): Payer: PPO

## 2020-07-07 ENCOUNTER — Other Ambulatory Visit: Payer: Self-pay

## 2020-07-07 VITALS — BP 124/80 | HR 58 | Temp 98.2°F | Resp 16 | Ht 65.5 in | Wt 178.0 lb

## 2020-07-07 DIAGNOSIS — Z0000 Encounter for general adult medical examination without abnormal findings: Secondary | ICD-10-CM

## 2020-07-07 NOTE — Patient Instructions (Signed)
Mary Ward , Thank you for taking time to come for your Medicare Wellness Visit. I appreciate your ongoing commitment to your health goals. Please review the following plan we discussed and let me know if I can assist you in the future.   Screening recommendations/referrals: Colonoscopy: 04/05/2019; due every 7 years Mammogram: 12/31/2019 Bone Density: 03/28/2019; due every 2-3 years Recommended yearly ophthalmology/optometry visit for glaucoma screening and checkup Recommended yearly dental visit for hygiene and checkup  Vaccinations: Influenza vaccine: 07/02/2020 Pneumococcal vaccine: up to date Tdap vaccine: 09/13/1994; overdue Shingles vaccine: up to date   Covid-19: up to date  Advanced directives: Please bring a copy of your health care power of attorney and living will to the office at your convenience.  Conditions/risks identified: Yes; Reviewed health maintenance screenings with patient today and relevant education, vaccines, and/or referrals were provided. Please continue to do your personal lifestyle choices by: daily care of teeth and gums, regular physical activity (goal should be 5 days a week for 30 minutes), eat a healthy diet, avoid tobacco and drug use, limiting any alcohol intake, taking a low-dose aspirin (if not allergic or have been advised by your provider otherwise) and taking vitamins and minerals as recommended by your provider. Continue doing brain stimulating activities (puzzles, reading, adult coloring books, staying active) to keep memory sharp. Continue to eat heart healthy diet (full of fruits, vegetables, whole grains, lean protein, water--limit salt, fat, and sugar intake) and increase physical activity as tolerated.  Next appointment: Please schedule your next Medicare Wellness Visit with your Nurse Health Advisor in 1 year by calling [phone].  Preventive Care 73 Years and Older, Female Preventive care refers to lifestyle choices and visits with your health  care provider that can promote health and wellness. What does preventive care include?  A yearly physical exam. This is also called an annual well check.  Dental exams once or twice a year.  Routine eye exams. Ask your health care provider how often you should have your eyes checked.  Personal lifestyle choices, including:  Daily care of your teeth and gums.  Regular physical activity.  Eating a healthy diet.  Avoiding tobacco and drug use.  Limiting alcohol use.  Practicing safe sex.  Taking low-dose aspirin every day.  Taking vitamin and mineral supplements as recommended by your health care provider. What happens during an annual well check? The services and screenings done by your health care provider during your annual well check will depend on your age, overall health, lifestyle risk factors, and family history of disease. Counseling  Your health care provider may ask you questions about your:  Alcohol use.  Tobacco use.  Drug use.  Emotional well-being.  Home and relationship well-being.  Sexual activity.  Eating habits.  History of falls.  Memory and ability to understand (cognition).  Work and work Statistician.  Reproductive health. Screening  You may have the following tests or measurements:  Height, weight, and BMI.  Blood pressure.  Lipid and cholesterol levels. These may be checked every 5 years, or more frequently if you are over 59 years old.  Skin check.  Lung cancer screening. You may have this screening every year starting at age 35 if you have a 30-pack-year history of smoking and currently smoke or have quit within the past 15 years.  Fecal occult blood test (FOBT) of the stool. You may have this test every year starting at age 18.  Flexible sigmoidoscopy or colonoscopy. You may have a sigmoidoscopy every  5 years or a colonoscopy every 10 years starting at age 48.  Hepatitis C blood test.  Hepatitis B blood test.  Sexually  transmitted disease (STD) testing.  Diabetes screening. This is done by checking your blood sugar (glucose) after you have not eaten for a while (fasting). You may have this done every 1-3 years.  Bone density scan. This is done to screen for osteoporosis. You may have this done starting at age 15.  Mammogram. This may be done every 1-2 years. Talk to your health care provider about how often you should have regular mammograms. Talk with your health care provider about your test results, treatment options, and if necessary, the need for more tests. Vaccines  Your health care provider may recommend certain vaccines, such as:  Influenza vaccine. This is recommended every year.  Tetanus, diphtheria, and acellular pertussis (Tdap, Td) vaccine. You may need a Td booster every 10 years.  Zoster vaccine. You may need this after age 79.  Pneumococcal 13-valent conjugate (PCV13) vaccine. One dose is recommended after age 55.  Pneumococcal polysaccharide (PPSV23) vaccine. One dose is recommended after age 96. Talk to your health care provider about which screenings and vaccines you need and how often you need them. This information is not intended to replace advice given to you by your health care provider. Make sure you discuss any questions you have with your health care provider. Document Released: 09/26/2015 Document Revised: 05/19/2016 Document Reviewed: 07/01/2015 Elsevier Interactive Patient Education  2017 [place] Prevention in the Home Falls can cause injuries. They can happen to people of all ages. There are many things you can do to make your home safe and to help prevent falls. What can I do on the outside of my home?  Regularly fix the edges of walkways and driveways and fix any cracks.  Remove anything that might make you trip as you walk through a door, such as a raised step or threshold.  Trim any bushes or trees on the path to your home.  Use bright outdoor  lighting.  Clear any walking paths of anything that might make someone trip, such as rocks or tools.  Regularly check to see if handrails are loose or broken. Make sure that both sides of any steps have handrails.  Any raised decks and porches should have guardrails on the edges.  Have any leaves, snow, or ice cleared regularly.  Use sand or salt on walking paths during winter.  Clean up any spills in your garage right away. This includes oil or grease spills. What can I do in the bathroom?  Use night lights.  Install grab bars by the toilet and in the tub and shower. Do not use towel bars as grab bars.  Use non-skid mats or decals in the tub or shower.  If you need to sit down in the shower, use a plastic, non-slip stool.  Keep the floor dry. Clean up any water that spills on the floor as soon as it happens.  Remove soap buildup in the tub or shower regularly.  Attach bath mats securely with double-sided non-slip rug tape.  Do not have throw rugs and other things on the floor that can make you trip. What can I do in the bedroom?  Use night lights.  Make sure that you have a light by your bed that is easy to reach.  Do not use any sheets or blankets that are too big for your bed. They should not  hang down onto the floor.  Have a firm chair that has side arms. You can use this for support while you get dressed.  Do not have throw rugs and other things on the floor that can make you trip. What can I do in the kitchen?  Clean up any spills right away.  Avoid walking on wet floors.  Keep items that you use a lot in easy-to-reach places.  If you need to reach something above you, use a strong step stool that has a grab bar.  Keep electrical cords out of the way.  Do not use floor polish or wax that makes floors slippery. If you must use wax, use non-skid floor wax.  Do not have throw rugs and other things on the floor that can make you trip. What can I do with my  stairs?  Do not leave any items on the stairs.  Make sure that there are handrails on both sides of the stairs and use them. Fix handrails that are broken or loose. Make sure that handrails are as [place] as the stairways.  Check any carpeting to make sure that it is firmly attached to the stairs. Fix any carpet that is loose or worn.  Avoid having throw rugs at the top or bottom of the stairs. If you do have throw rugs, attach them to the floor with carpet tape.  Make sure that you have a light switch at the top of the stairs and the bottom of the stairs. If you do not have them, ask someone to add them for you. What else can I do to help prevent falls?  Wear shoes that:  Do not have high heels.  Have rubber bottoms.  Are comfortable and fit you well.  Are closed at the toe. Do not wear sandals.  If you use a stepladder:  Make sure that it is fully opened. Do not climb a closed stepladder.  Make sure that both sides of the stepladder are locked into place.  Ask someone to hold it for you, if possible.  Clearly mark and make sure that you can see:  Any grab bars or handrails.  First and last steps.  Where the edge of each step is.  Use tools that help you move around (mobility aids) if they are needed. These include:  Canes.  Walkers.  Scooters.  Crutches.  Turn on the lights when you go into a dark area. Replace any light bulbs as soon as they burn out.  Set up your furniture so you have a clear path. Avoid moving your furniture around.  If any of your floors are uneven, fix them.  If there are any pets around you, be aware of where they are.  Review your medicines with your doctor. Some medicines can make you feel dizzy. This can increase your chance of falling. Ask your doctor what other things that you can do to help prevent falls. This information is not intended to replace advice given to you by your health care provider. Make sure you discuss any  questions you have with your health care provider. Document Released: 06/26/2009 Document Revised: 02/05/2016 Document Reviewed: 10/04/2014 Elsevier Interactive Patient Education  2017 [employer].

## 2020-07-07 NOTE — Progress Notes (Signed)
Subjective:   Mary Ward is a 74 y.o. female who presents for Medicare Annual (Subsequent) preventive examination.  Review of Systems    No ROS. Medicare Wellness Visit. Cardiac Risk Factors include: advanced age (>63men, >98 women);dyslipidemia;hypertension;family history of premature cardiovascular disease Sleep Patterns: Feels rested on waking and sleeps 7+hours nightly. Home Safety/Smoke Alarms: Feels safe in home; uses home alarm. Smoke alarms in place. Living environment: 2-story home; Lives with spouse; no needs for DME; good support system. Seat Belt Safety/Bike Helmet: Wears seat belt.    Objective:    Today's Vitals   07/07/20 1353  BP: 124/80  Pulse: (!) 58  Resp: 16  Temp: 98.2 F (36.8 C)  SpO2: 94%  Weight: 178 lb (80.7 kg)  Height: 5' 5.5" (1.664 m)  PainSc: 0-No pain   Body mass index is 29.17 kg/m.  Advanced Directives 07/07/2020 04/07/2020 01/08/2014  Does Patient Have a Medical Advance Directive? Yes No Patient has advance directive, copy not in chart  Type of Advance Directive [place];Living will - -  Does patient want to make changes to medical advance directive? No - Patient declined - -  Copy of [place] in Chart? No - copy requested - -    Current Medications (verified) Outpatient Encounter Medications as of 07/07/2020  Medication Sig  . polyethylene glycol (MIRALAX / GLYCOLAX) 17 g packet Take 17 g by mouth daily.  Marland Kitchen aspirin 81 MG tablet Take 81 mg by mouth daily.    Marland Kitchen atorvastatin (LIPITOR) 20 MG tablet Take 1 tablet (20 mg total) by mouth daily. Annual appt due in APRIL must see provider for future refills  . Black Pepper-Turmeric (TURMERIC COMPLEX/BLACK PEPPER PO)   . SYNTHROID 125 MCG tablet Take 1 tablet (125 mcg total) by mouth daily. DAW brand  . telmisartan-hydrochlorothiazide (MICARDIS HCT) 40-12.5 MG tablet Take 1 tablet by mouth daily. Annual appt due in APRIL must see provider for future  refills  . traZODone (DESYREL) 50 MG tablet Take 1 tablet (50 mg total) by mouth at bedtime as needed for sleep.  . [DISCONTINUED] ciprofloxacin (CIPRO) 500 MG tablet Take 1 tablet (500 mg total) by mouth 2 (two) times daily.  . [DISCONTINUED] metroNIDAZOLE (FLAGYL) 500 MG tablet Take 1 tablet (500 mg total) by mouth 3 (three) times daily.   No facility-administered encounter medications on file as of 07/07/2020.    Allergies (verified) Indomethacin, Prednisone, Prevnar 13 [pneumococcal 13-val conj vacc], Tetanus toxoid, and Tetracycline   History: Past Medical History:  Diagnosis Date  . Allergy    dust, dogs, grass  . EN (erythema nodosum)   . Hyperlipidemia   . Hyperplastic colonic polyp   . Hypertension   . Hypothyroidism   . Obesity    Past Surgical History:  Procedure Laterality Date  . ABDOMINAL HYSTERECTOMY    . BREAST REDUCTION SURGERY  2002  . BUNIONECTOMY     both feet  . COLONOSCOPY  01/22/2014  . CYST EXCISION  12/15/12   punch biopsy left shoulder blade  . HAMMER TOE SURGERY     right 2nd toe  . LAPAROSCOPIC ASSISTED VAGINAL HYSTERECTOMY  1992   with BSO  . OOPHORECTOMY    . TONSILLECTOMY     Family History  Problem Relation Age of Onset  . Hyperlipidemia Mother   . Stroke Mother   . Hypertension Mother   . Heart attack Mother   . Cancer Father  Lung  . Diabetes Sister   . Heart disease Sister        CAD  . Hypertension Sister   . Colon cancer Neg Hx   . Esophageal cancer Neg Hx   . Rectal cancer Neg Hx   . Stomach cancer Neg Hx   . Colon polyps Neg Hx    Social History   Socioeconomic History  . Marital status: Married    Spouse name: Not on file  . Number of children: 0  . Years of education: Not on file  . Highest education level: High school graduate  Occupational History  . Occupation: Psychologist, occupational: [employer]  Tobacco Use  . Smoking status: Never Smoker  . Smokeless tobacco: Never Used  Vaping Use    . Vaping Use: Never used  Substance and Sexual Activity  . Alcohol use: Not Currently    Alcohol/week: 0.0 standard drinks  . Drug use: No  . Sexual activity: Never    Birth control/protection: Post-menopausal, Surgical    Comment: Hysterectomy  Other Topics Concern  . Not on file  Social History Narrative   Family history of high Cholesterol   Social Determinants of Health   Financial Resource Strain: Low Risk   . Difficulty of Paying Living Expenses: Not hard at all  Food Insecurity: No Food Insecurity  . Worried About Charity fundraiser in the Last Year: Never true  . Ran Out of Food in the Last Year: Never true  Transportation Needs: No Transportation Needs  . Lack of Transportation (Medical): No  . Lack of Transportation (Non-Medical): No  Physical Activity: Sufficiently Active  . Days of Exercise per Week: 4 days  . Minutes of Exercise per Session: 60 min  Stress: Stress Concern Present  . Feeling of Stress : Very much  Social Connections:   . Frequency of Communication with Friends and Family: Not on file  . Frequency of Social Gatherings with Friends and Family: Not on file  . Attends Religious Services: Not on file  . Active Member of Clubs or Organizations: Not on file  . Attends Archivist Meetings: Not on file  . Marital Status: Not on file    Tobacco Counseling Counseling given: Not Answered   Clinical Intake:  Pre-visit preparation completed: Yes  Pain : No/denies pain Pain Score: 0-No pain     BMI - recorded: 29.17 Nutritional Status: BMI 25 -29 Overweight Nutritional Risks: None Diabetes: No  How often do you need to have someone help you when you read instructions, pamphlets, or other written materials from your doctor or pharmacy?: 1 - Never What is the last grade level you completed in school?: Some College  Diabetic? no  Interpreter Needed?: No  Information entered by :: Samon Dishner N. Mario Voong, LPN   Activities of Daily  Living In your present state of health, do you have any difficulty performing the following activities: 07/07/2020  Hearing? N  Vision? N  Difficulty concentrating or making decisions? N  Walking or climbing stairs? N  Dressing or bathing? N  Doing errands, shopping? N  Preparing Food and eating ? N  Using the Toilet? N  In the past six months, have you accidently leaked urine? N  Do you have problems with loss of bowel control? N  Managing your Medications? N  Managing your Finances? N  Housekeeping or managing your Housekeeping? N  Some recent data might be hidden    Patient Care  Team: Biagio Borg, MD as PCP - General Kennith [place], RD as Dietitian (Family Medicine)  Indicate any recent Medical Services you may have received from other than [facility] providers in the past year (date may be approximate).     Assessment:   This is a routine wellness examination for Lalaine.  Hearing/Vision screen No exam data present  Dietary issues and exercise activities discussed: Current Exercise Habits: Structured exercise class;Home exercise routine, Type of exercise: walking;strength training/weights;Other - see comments (Goes to the gym 4 days per week doing aerobics, walking, treadmill; does own yard work), Time (Minutes): 60, Frequency (Times/Week): 4, Weekly Exercise (Minutes/Week): 240, Intensity: Moderate, Exercise limited by: None identified  Goals    . Patient Stated     To maintain my current health status by continuing to eat healthy, stay physically active and socially active.      Depression Screen PHQ 2/9 Scores 07/07/2020 04/11/2020 01/09/2019 01/03/2018 11/04/2017 12/23/2016 12/23/2015  PHQ - 2 Score 0 0 0 0 1 2 1   PHQ- 9 Score - - - 0 - 8 -    Fall Risk Fall Risk  07/07/2020 04/11/2020 01/09/2019 11/05/2017 11/04/2017  Falls in the past year? 0 0 0 No No  Number falls in past yr: 0 0 - - -  Injury with Fall? 0 0 - - -  Risk for fall due to : No Fall Risks No Fall Risks - - -    Follow up Falls evaluation completed;Education provided Falls evaluation completed - - -    Any stairs in or around the home? Yes  If so, are there any without handrails? No  Home free of loose throw rugs in walkways, pet beds, electrical cords, etc? Yes  Adequate lighting in your home to reduce risk of falls? Yes   ASSISTIVE DEVICES UTILIZED TO PREVENT FALLS:  Life alert? No  Use of a cane, walker or w/c? No  Grab bars in the bathroom? Yes  Shower chair or bench in shower? Yes  Elevated toilet seat or a handicapped toilet? Yes   TIMED UP AND GO:  Was the test performed? No .  Length of time to ambulate 10 feet: 0 sec.   Gait steady and fast without use of assistive device  Cognitive Function: Patient is cogitatively intact.        Immunizations Immunization History  Administered Date(s) Administered  . Fluad Quad(high Dose 65+) 06/23/2019, 07/02/2020  . Influenza Whole 07/30/1999, 07/14/2006, 07/03/2007, 06/13/2008, 06/13/2009  . Influenza, High Dose Seasonal PF 06/23/2018  . Influenza, Seasonal, Injecte, Preservative Fre 06/13/2013  . Influenza-Unspecified 06/20/2014, 06/19/2019  . PFIZER SARS-COV-2 Vaccination 10/18/2019, 11/12/2019  . Pneumococcal Conjugate-13 11/16/2013  . Pneumococcal Polysaccharide-23 08/07/2007, 11/15/2012  . Pneumococcal-Unspecified 03/02/2018  . Td 09/13/1994  . Varicella 02/12/1952  . Zoster 01/01/2008, 05/29/2019  . Zoster Recombinat (Shingrix) 03/21/2019, 05/22/2019    TDAP status: Due, Education has been provided regarding the importance of this vaccine. Advised may receive this vaccine at local pharmacy or Health Dept. Aware to provide a copy of the vaccination record if obtained from local pharmacy or Health Dept. Verbalized acceptance and understanding.  (Patient declined)  Flu Vaccine status: Up to date Pneumococcal vaccine status: Up to date Covid-19 vaccine status: Completed vaccines  Qualifies for Shingles Vaccine? Yes    Zostavax completed Yes   Shingrix Completed?: Yes  Screening Tests Health Maintenance  Topic Date Due  . TETANUS/TDAP  01/09/2021 (Originally 09/13/2004)  . MAMMOGRAM  12/30/2021  . COLONOSCOPY  04/04/2026  . INFLUENZA VACCINE  Completed  . DEXA SCAN  Completed  . COVID-19 Vaccine  Completed  . Hepatitis C Screening  Completed  . PNA vac Low Risk Adult  Completed    Health Maintenance  There are no preventive care reminders to display for this patient.  Colorectal cancer screening: Completed 04/05/2019. Repeat every 7 years Mammogram status: Completed 12/31/2019. Repeat every year Bone Density status: Completed 03/28/2019. Results reflect: Bone density results: NORMAL. Repeat every 2-3 years.  Lung Cancer Screening: (Low Dose CT Chest recommended if Age 34-80 years, 30 pack-year currently smoking OR have quit w/in 15years.) does not qualify.   Lung Cancer Screening Referral: no  Additional Screening:  Hepatitis C Screening: does qualify; Completed yes  Vision Screening: Recommended annual ophthalmology exams for early detection of glaucoma and other disorders of the eye. Is the patient up to date with their annual eye exam?  Yes  Who is the provider or what is the name of the office in which the patient attends annual eye exams? Bufford Buttner, OD If pt is not established with a provider, would they like to be referred to a provider to establish care? No .   Dental Screening: Recommended annual dental exams for proper oral hygiene  Community Resource Referral / Chronic Care Management: CRR required this visit?  No   CCM required this visit?  No      Plan:     I have personally reviewed and noted the following in the patient's chart:   . Medical and social history . Use of alcohol, tobacco or illicit drugs  . Current medications and supplements . Functional ability and status . Nutritional status . Physical activity . Advanced directives . List of other  physicians . Hospitalizations, surgeries, and ER visits in previous 12 months . Vitals . Screenings to include cognitive, depression, and falls . Referrals and appointments  In addition, I have reviewed and discussed with patient certain preventive protocols, quality metrics, and best practice recommendations. A written personalized care plan for preventive services as well as general preventive health recommendations were provided to patient.     Sheral Flow, LPN   [id number]   Nurse Notes: n/a

## 2020-08-15 DIAGNOSIS — D1801 Hemangioma of skin and subcutaneous tissue: Secondary | ICD-10-CM | POA: Diagnosis not present

## 2020-08-15 DIAGNOSIS — D225 Melanocytic nevi of trunk: Secondary | ICD-10-CM | POA: Diagnosis not present

## 2020-08-15 DIAGNOSIS — M7130 Other bursal cyst, unspecified site: Secondary | ICD-10-CM | POA: Diagnosis not present

## 2020-08-15 DIAGNOSIS — L821 Other seborrheic keratosis: Secondary | ICD-10-CM | POA: Diagnosis not present

## 2020-09-16 DIAGNOSIS — D225 Melanocytic nevi of trunk: Secondary | ICD-10-CM | POA: Diagnosis not present

## 2020-09-16 DIAGNOSIS — L821 Other seborrheic keratosis: Secondary | ICD-10-CM | POA: Diagnosis not present

## 2020-09-16 DIAGNOSIS — L814 Other melanin hyperpigmentation: Secondary | ICD-10-CM | POA: Diagnosis not present

## 2020-09-16 DIAGNOSIS — D1801 Hemangioma of skin and subcutaneous tissue: Secondary | ICD-10-CM | POA: Diagnosis not present

## 2020-09-24 DIAGNOSIS — N281 Cyst of kidney, acquired: Secondary | ICD-10-CM | POA: Diagnosis not present

## 2020-10-22 DIAGNOSIS — Z01419 Encounter for gynecological examination (general) (routine) without abnormal findings: Secondary | ICD-10-CM | POA: Diagnosis not present

## 2020-10-22 DIAGNOSIS — Z6827 Body mass index (BMI) 27.0-27.9, adult: Secondary | ICD-10-CM | POA: Diagnosis not present

## 2020-10-23 DIAGNOSIS — M542 Cervicalgia: Secondary | ICD-10-CM | POA: Diagnosis not present

## 2020-10-23 DIAGNOSIS — G44201 Tension-type headache, unspecified, intractable: Secondary | ICD-10-CM | POA: Diagnosis not present

## 2020-10-23 DIAGNOSIS — M9901 Segmental and somatic dysfunction of cervical region: Secondary | ICD-10-CM | POA: Diagnosis not present

## 2020-10-23 DIAGNOSIS — M9900 Segmental and somatic dysfunction of head region: Secondary | ICD-10-CM | POA: Diagnosis not present

## 2020-10-27 DIAGNOSIS — M542 Cervicalgia: Secondary | ICD-10-CM | POA: Diagnosis not present

## 2020-10-27 DIAGNOSIS — G44201 Tension-type headache, unspecified, intractable: Secondary | ICD-10-CM | POA: Diagnosis not present

## 2020-10-27 DIAGNOSIS — M9900 Segmental and somatic dysfunction of head region: Secondary | ICD-10-CM | POA: Diagnosis not present

## 2020-10-27 DIAGNOSIS — M9901 Segmental and somatic dysfunction of cervical region: Secondary | ICD-10-CM | POA: Diagnosis not present

## 2020-10-29 DIAGNOSIS — M9900 Segmental and somatic dysfunction of head region: Secondary | ICD-10-CM | POA: Diagnosis not present

## 2020-10-29 DIAGNOSIS — M542 Cervicalgia: Secondary | ICD-10-CM | POA: Diagnosis not present

## 2020-10-29 DIAGNOSIS — M9901 Segmental and somatic dysfunction of cervical region: Secondary | ICD-10-CM | POA: Diagnosis not present

## 2020-10-29 DIAGNOSIS — G44201 Tension-type headache, unspecified, intractable: Secondary | ICD-10-CM | POA: Diagnosis not present

## 2020-11-03 ENCOUNTER — Telehealth: Payer: PPO | Admitting: Internal Medicine

## 2020-11-04 ENCOUNTER — Telehealth: Payer: Self-pay | Admitting: Internal Medicine

## 2020-11-04 DIAGNOSIS — M542 Cervicalgia: Secondary | ICD-10-CM | POA: Diagnosis not present

## 2020-11-04 DIAGNOSIS — M9900 Segmental and somatic dysfunction of head region: Secondary | ICD-10-CM | POA: Diagnosis not present

## 2020-11-04 DIAGNOSIS — G44201 Tension-type headache, unspecified, intractable: Secondary | ICD-10-CM | POA: Diagnosis not present

## 2020-11-04 DIAGNOSIS — M9901 Segmental and somatic dysfunction of cervical region: Secondary | ICD-10-CM | POA: Diagnosis not present

## 2020-11-04 NOTE — Telephone Encounter (Addendum)
   Patient calling to report this week she has been unable to select options when dialing into the office. She states from her cell phone, the options dont work, stating when she pushes a number the call drops. Advised patient to contact her mobile phone carrier, she declined stating this issue only happens with this office. She and her husband state the issue just started this week, no problems in the past.  Has anyone else heard this to be an issue this week?

## 2020-11-05 ENCOUNTER — Encounter: Payer: Self-pay | Admitting: Internal Medicine

## 2020-11-05 ENCOUNTER — Telehealth (INDEPENDENT_AMBULATORY_CARE_PROVIDER_SITE_OTHER): Payer: PPO | Admitting: Internal Medicine

## 2020-11-05 ENCOUNTER — Telehealth: Payer: PPO | Admitting: Internal Medicine

## 2020-11-05 DIAGNOSIS — J309 Allergic rhinitis, unspecified: Secondary | ICD-10-CM | POA: Diagnosis not present

## 2020-11-05 DIAGNOSIS — R739 Hyperglycemia, unspecified: Secondary | ICD-10-CM | POA: Diagnosis not present

## 2020-11-05 DIAGNOSIS — F418 Other specified anxiety disorders: Secondary | ICD-10-CM

## 2020-11-05 MED ORDER — PREDNISONE 10 MG PO TABS
ORAL_TABLET | ORAL | 0 refills | Status: DC
Start: 2020-11-05 — End: 2021-02-03

## 2020-11-05 NOTE — Progress Notes (Signed)
Patient ID: Mary Ward, female   DOB: [birth year], 75 y.o.   MRN: [id number]  Virtual Visit via Video Note  I connected with Mary Ward on 11/05/20 at  3:40 PM EST by a video enabled telemedicine application and verified that I am speaking with the correct person using two identifiers.  Location of all participants today Patient: at home Provider: at office   I discussed the limitations of evaluation and management by telemedicine and the availability of in person appointments. The patient expressed understanding and agreed to proceed.  History of Present Illness: Here with c/o 2 wks onset mild to mod peristent constant burning to top of neg (better with chiropracter) but also ear and eyes and sinus with dull pain, pressure and clearish congestion, without fever, chills, ST, cough, and Pt denies chest pain, increased sob or doe, wheezing, orthopnea, PND, increased LE swelling, palpitations, dizziness or syncope.  Was covid tested neg 3 days ago.   Pt denies fever, wt loss, night sweats, loss of appetite, or other constitutional   Pt denies polydipsia, polyuria  Denies worsening depressive symptoms, suicidal ideation, or panic Past Medical History:  Diagnosis Date  . Allergy    dust, dogs, grass  . EN (erythema nodosum)   . Hyperlipidemia   . Hyperplastic colonic polyp   . Hypertension   . Hypothyroidism   . Obesity    Past Surgical History:  Procedure Laterality Date  . ABDOMINAL HYSTERECTOMY    . BREAST REDUCTION SURGERY  2002  . BUNIONECTOMY     both feet  . COLONOSCOPY  01/22/2014  . CYST EXCISION  12/15/12   punch biopsy left shoulder blade  . HAMMER TOE SURGERY     right 2nd toe  . LAPAROSCOPIC ASSISTED VAGINAL HYSTERECTOMY  1992   with BSO  . OOPHORECTOMY    . TONSILLECTOMY      reports that she has never smoked. She has never used smokeless tobacco. She reports previous alcohol use. She reports that she does not use drugs. family history includes Cancer in her father;  Diabetes in her sister; Heart attack in her mother; Heart disease in her sister; Hyperlipidemia in her mother; Hypertension in her mother and sister; Stroke in her mother. Allergies  Allergen Reactions  . Indomethacin     Took with ASA and caused severe bloating  . Prednisone     Developed acne  . Prevnar 13 [Pneumococcal 13-Val Conj Vacc]     Arm became very sore and pt developed "fiery red knot" at site  . Tetanus Toxoid     Entire arm became swollen and turned red  . Tetracycline     "hot, red knots" below knees  \ Current Outpatient Medications on File Prior to Visit  Medication Sig Dispense Refill  . aspirin 81 MG tablet Take 81 mg by mouth daily.      Marland Kitchen atorvastatin (LIPITOR) 20 MG tablet Take 1 tablet (20 mg total) by mouth daily. Annual appt due in APRIL must see provider for future refills 90 tablet 3  . Black Pepper-Turmeric (TURMERIC COMPLEX/BLACK PEPPER PO)     . polyethylene glycol (MIRALAX / GLYCOLAX) 17 g packet Take 17 g by mouth daily.    Marland Kitchen SYNTHROID 125 MCG tablet Take 1 tablet (125 mcg total) by mouth daily. DAW brand 90 tablet 3  . telmisartan-hydrochlorothiazide (MICARDIS HCT) 40-12.5 MG tablet Take 1 tablet by mouth daily. Annual appt due in APRIL must see provider for future refills 90 tablet 3  .  traZODone (DESYREL) 50 MG tablet Take 1 tablet (50 mg total) by mouth at bedtime as needed for sleep. 90 tablet 1   No current facility-administered medications on file prior to visit.    Observations/Objective: Alert, NAD, appropriate mood and affect, resps normal, cn 2-12 intact, moves all 4s, no visible rash or swelling Lab Results  Component Value Date   WBC 12.4 (H) 04/07/2020   HGB 15.1 (H) 04/07/2020   HCT 46.7 (H) 04/07/2020   PLT 155 04/07/2020   GLUCOSE 126 (H) 04/07/2020   CHOL 133 01/01/2020   TRIG 111.0 01/01/2020   HDL 46.20 01/01/2020   LDLCALC 64 01/01/2020   ALT 21 04/07/2020   AST 17 04/07/2020   NA 139 04/07/2020   K 3.9 04/07/2020   CL  100 04/07/2020   CREATININE 0.69 04/07/2020   BUN 13 04/07/2020   CO2 26 04/07/2020   TSH 1.61 01/01/2020   HGBA1C 6.2 01/01/2020   Assessment and Plan: See notes  Follow Up Instructions: See notes   I discussed the assessment and treatment plan with the patient. The patient was provided an opportunity to ask questions and all were answered. The patient agreed with the plan and demonstrated an understanding of the instructions.   The patient was advised to call back or seek an in-person evaluation if the symptoms worsen or if the condition fails to improve as anticipated.   Cathlean Cower, MD

## 2020-11-05 NOTE — Assessment & Plan Note (Signed)
Stable overall, cont current med tx - none

## 2020-11-05 NOTE — Assessment & Plan Note (Signed)
Mild to mod, for predpac course,  to f/u any worsening symptoms or concerns, consider empiric antibx if not improved

## 2020-11-05 NOTE — Patient Instructions (Signed)
Please take all new medication as prescribed 

## 2020-11-05 NOTE — Assessment & Plan Note (Signed)
Lab Results  Component Value Date   HGBA1C 6.2 01/01/2020   Stable, pt to continue current medical treatment  - diet

## 2020-11-25 DIAGNOSIS — M542 Cervicalgia: Secondary | ICD-10-CM | POA: Diagnosis not present

## 2020-11-25 DIAGNOSIS — G44201 Tension-type headache, unspecified, intractable: Secondary | ICD-10-CM | POA: Diagnosis not present

## 2020-11-25 DIAGNOSIS — M9900 Segmental and somatic dysfunction of head region: Secondary | ICD-10-CM | POA: Diagnosis not present

## 2020-11-25 DIAGNOSIS — M9901 Segmental and somatic dysfunction of cervical region: Secondary | ICD-10-CM | POA: Diagnosis not present

## 2020-12-04 DIAGNOSIS — L821 Other seborrheic keratosis: Secondary | ICD-10-CM | POA: Diagnosis not present

## 2021-01-05 DIAGNOSIS — Z1231 Encounter for screening mammogram for malignant neoplasm of breast: Secondary | ICD-10-CM | POA: Diagnosis not present

## 2021-01-12 ENCOUNTER — Other Ambulatory Visit: Payer: PPO

## 2021-01-15 ENCOUNTER — Encounter: Payer: PPO | Admitting: Internal Medicine

## 2021-01-19 ENCOUNTER — Encounter: Payer: PPO | Admitting: Internal Medicine

## 2021-01-19 DIAGNOSIS — M9900 Segmental and somatic dysfunction of head region: Secondary | ICD-10-CM | POA: Diagnosis not present

## 2021-01-19 DIAGNOSIS — G44201 Tension-type headache, unspecified, intractable: Secondary | ICD-10-CM | POA: Diagnosis not present

## 2021-01-19 DIAGNOSIS — M9901 Segmental and somatic dysfunction of cervical region: Secondary | ICD-10-CM | POA: Diagnosis not present

## 2021-01-19 DIAGNOSIS — M542 Cervicalgia: Secondary | ICD-10-CM | POA: Diagnosis not present

## 2021-01-21 DIAGNOSIS — G44201 Tension-type headache, unspecified, intractable: Secondary | ICD-10-CM | POA: Diagnosis not present

## 2021-01-21 DIAGNOSIS — M9901 Segmental and somatic dysfunction of cervical region: Secondary | ICD-10-CM | POA: Diagnosis not present

## 2021-01-21 DIAGNOSIS — M542 Cervicalgia: Secondary | ICD-10-CM | POA: Diagnosis not present

## 2021-01-21 DIAGNOSIS — M9900 Segmental and somatic dysfunction of head region: Secondary | ICD-10-CM | POA: Diagnosis not present

## 2021-01-29 ENCOUNTER — Other Ambulatory Visit: Payer: Self-pay

## 2021-01-29 ENCOUNTER — Other Ambulatory Visit (INDEPENDENT_AMBULATORY_CARE_PROVIDER_SITE_OTHER): Payer: PPO

## 2021-01-29 DIAGNOSIS — R739 Hyperglycemia, unspecified: Secondary | ICD-10-CM | POA: Diagnosis not present

## 2021-01-29 DIAGNOSIS — Z0000 Encounter for general adult medical examination without abnormal findings: Secondary | ICD-10-CM | POA: Diagnosis not present

## 2021-01-29 LAB — URINALYSIS, ROUTINE W REFLEX MICROSCOPIC
Bilirubin Urine: NEGATIVE
Hgb urine dipstick: NEGATIVE
Ketones, ur: NEGATIVE
Nitrite: NEGATIVE
Specific Gravity, Urine: 1.02 (ref 1.000–1.030)
Total Protein, Urine: NEGATIVE
Urine Glucose: NEGATIVE
Urobilinogen, UA: 0.2 (ref 0.0–1.0)
pH: 5.5 (ref 5.0–8.0)

## 2021-01-29 LAB — CBC WITH DIFFERENTIAL/PLATELET
Basophils Absolute: 0 10*3/uL (ref 0.0–0.1)
Basophils Relative: 0.5 % (ref 0.0–3.0)
Eosinophils Absolute: 0.1 10*3/uL (ref 0.0–0.7)
Eosinophils Relative: 2.2 % (ref 0.0–5.0)
HCT: 43.9 % (ref 36.0–46.0)
Hemoglobin: 14.7 g/dL (ref 12.0–15.0)
Lymphocytes Relative: 38.2 % (ref 12.0–46.0)
Lymphs Abs: 2.4 10*3/uL (ref 0.7–4.0)
MCHC: 33.6 g/dL (ref 30.0–36.0)
MCV: 86.1 fl (ref 78.0–100.0)
Monocytes Absolute: 0.4 10*3/uL (ref 0.1–1.0)
Monocytes Relative: 7 % (ref 3.0–12.0)
Neutro Abs: 3.3 10*3/uL (ref 1.4–7.7)
Neutrophils Relative %: 52.1 % (ref 43.0–77.0)
Platelets: 141 10*3/uL — ABNORMAL LOW (ref 150.0–400.0)
RBC: 5.1 Mil/uL (ref 3.87–5.11)
RDW: 13.1 % (ref 11.5–15.5)
WBC: 6.4 10*3/uL (ref 4.0–10.5)

## 2021-01-29 LAB — BASIC METABOLIC PANEL
BUN: 21 mg/dL (ref 6–23)
CO2: 30 mEq/L (ref 19–32)
Calcium: 9.5 mg/dL (ref 8.4–10.5)
Chloride: 104 mEq/L (ref 96–112)
Creatinine, Ser: 0.86 mg/dL (ref 0.40–1.20)
GFR: 66.98 mL/min (ref >60.00)
Glucose, Bld: 102 mg/dL — ABNORMAL HIGH (ref 70–99)
Potassium: 4.3 mEq/L (ref 3.5–5.1)
Sodium: 140 mEq/L (ref 135–145)

## 2021-01-29 LAB — LIPID PANEL
Cholesterol: 138 mg/dL (ref 0–200)
HDL: 51.2 mg/dL (ref >39.00)
LDL Cholesterol: 70 mg/dL (ref 0–99)
NonHDL: 86.86
Total CHOL/HDL Ratio: 3
Triglycerides: 85 mg/dL (ref 0.0–149.0)
VLDL: 17 mg/dL (ref 0.0–40.0)

## 2021-01-29 LAB — HEPATIC FUNCTION PANEL
ALT: 16 U/L (ref 0–35)
AST: 16 U/L (ref 0–37)
Albumin: 4.3 g/dL (ref 3.5–5.2)
Alkaline Phosphatase: 82 U/L (ref 39–117)
Bilirubin, Direct: 0.1 mg/dL (ref 0.0–0.3)
Total Bilirubin: 0.6 mg/dL (ref 0.2–1.2)
Total Protein: 6.9 g/dL (ref 6.0–8.3)

## 2021-01-29 LAB — HEMOGLOBIN A1C: Hgb A1c MFr Bld: 5.6 % (ref 4.6–6.5)

## 2021-01-29 LAB — TSH: TSH: 0.59 u[IU]/mL (ref 0.35–4.50)

## 2021-02-02 ENCOUNTER — Other Ambulatory Visit: Payer: Self-pay

## 2021-02-03 ENCOUNTER — Encounter: Payer: Self-pay | Admitting: Internal Medicine

## 2021-02-03 ENCOUNTER — Ambulatory Visit (INDEPENDENT_AMBULATORY_CARE_PROVIDER_SITE_OTHER): Payer: PPO | Admitting: Internal Medicine

## 2021-02-03 VITALS — BP 126/70 | HR 61 | Temp 98.1°F | Ht 65.5 in | Wt 165.0 lb

## 2021-02-03 DIAGNOSIS — Z0000 Encounter for general adult medical examination without abnormal findings: Secondary | ICD-10-CM

## 2021-02-03 DIAGNOSIS — I2510 Atherosclerotic heart disease of native coronary artery without angina pectoris: Secondary | ICD-10-CM | POA: Diagnosis not present

## 2021-02-03 DIAGNOSIS — G4700 Insomnia, unspecified: Secondary | ICD-10-CM

## 2021-02-03 DIAGNOSIS — R739 Hyperglycemia, unspecified: Secondary | ICD-10-CM | POA: Diagnosis not present

## 2021-02-03 MED ORDER — ATORVASTATIN CALCIUM 20 MG PO TABS: 20.0000 mg | ORAL_TABLET | Freq: Every day | ORAL | 3 refills | Status: DC

## 2021-02-03 MED ORDER — TELMISARTAN-HCTZ 40-12.5 MG PO TABS: 1.0000 | ORAL_TABLET | Freq: Every day | ORAL | 3 refills | Status: DC

## 2021-02-03 MED ORDER — SYNTHROID 125 MCG PO TABS: 125.0000 ug | ORAL_TABLET | Freq: Every day | ORAL | 3 refills | Status: DC

## 2021-02-03 NOTE — Progress Notes (Signed)
Patient ID: Mary Ward, female   DOB: Sep 20, 1945, 75 y.o.   MRN: [id number]         Chief Complaint:: wellness exam and insomnia       HPI:  Mary Ward is a 75 y.o. female here for wellness exam; declines tdap due to allergy.  o'w up to date with preventive referrals andimmunizations                        Also has lost about 60 lbs from peak wk with wt watchers. Pt denies chest pain, increased sob or doe, wheezing, orthopnea, PND, increased LE swelling, palpitations, dizziness or syncope.   Pt denies polydipsia, polyuria, or new focal neuro s/s.   Pt denies fever, wt loss, night sweats, loss of appetite, or other constitutional symptoms  No other new complaints except for persistent insomnia but benadryl seems to work ok for now.   Wt Readings from Last 3 Encounters:  02/03/21 165 lb (74.8 kg)  07/07/20 178 lb (80.7 kg)  04/11/20 195 lb (88.5 kg)   BP Readings from Last 3 Encounters:  02/03/21 126/70  07/07/20 124/80  04/11/20 (!) 136/80   Immunization History  Administered Date(s) Administered  . Fluad Quad(high Dose 65+) 06/23/2019, 07/02/2020  . Influenza Whole 07/30/1999, 07/14/2006, 07/03/2007, 06/13/2008, 06/13/2009  . Influenza, High Dose Seasonal PF 06/23/2018  . Influenza, Seasonal, Injecte, Preservative Fre 06/13/2013  . Influenza-Unspecified 06/20/2014, 06/19/2019  . PFIZER(Purple Top)SARS-COV-2 Vaccination 10/18/2019, 11/12/2019, 06/09/2020  . Pneumococcal Conjugate-13 11/16/2013  . Pneumococcal Polysaccharide-23 08/07/2007, 11/15/2012  . Pneumococcal-Unspecified 03/02/2018  . Td 09/13/1994  . Varicella 02/12/1952  . Zoster Recombinat (Shingrix) 03/21/2019, 05/22/2019  . Zoster, Live 01/01/2008, 05/29/2019   There are no preventive care reminders to display for this patient.    Past Medical History:  Diagnosis Date  . Allergy    dust, dogs, grass  . EN (erythema nodosum)   . Hyperlipidemia   . Hyperplastic colonic polyp   . Hypertension   . Hypothyroidism    . Obesity    Past Surgical History:  Procedure Laterality Date  . ABDOMINAL HYSTERECTOMY    . BREAST REDUCTION SURGERY  2002  . BUNIONECTOMY     both feet  . COLONOSCOPY  01/22/2014  . CYST EXCISION  12/15/12   punch biopsy left shoulder blade  . HAMMER TOE SURGERY     right 2nd toe  . LAPAROSCOPIC ASSISTED VAGINAL HYSTERECTOMY  1992   with BSO  . OOPHORECTOMY    . TONSILLECTOMY      reports that she has never smoked. She has never used smokeless tobacco. She reports previous alcohol use. She reports that she does not use drugs. family history includes Cancer in her father; Diabetes in her sister; Heart attack in her mother; Heart disease in her sister; Hyperlipidemia in her mother; Hypertension in her mother and sister; Stroke in her mother. Allergies  Allergen Reactions  . Indomethacin     Took with ASA and caused severe bloating  . Prednisone     Developed acne  . Prevnar 13 [Pneumococcal 13-Val Conj Vacc]     Arm became very sore and pt developed "fiery red knot" at site  . Tetanus Toxoid     Entire arm became swollen and turned red  . Tetracycline     "hot, red knots" below knees   Current Outpatient Medications on File Prior to Visit  Medication Sig Dispense Refill  . aspirin 81 MG tablet  Take 81 mg by mouth daily.    . polyethylene glycol (MIRALAX / GLYCOLAX) 17 g packet Take 17 g by mouth daily.     No current facility-administered medications on file prior to visit.        ROS:  All others reviewed and negative.  Objective        PE:  BP 126/70 (BP Location: Right Arm, Patient Position: Sitting, Cuff Size: Normal)   Pulse 61   Temp 98.1 F (36.7 C) (Oral)   Ht 5' 5.5" (1.664 m)   Wt 165 lb (74.8 kg)   LMP 09/13/1994   SpO2 97%   BMI 27.04 kg/m                 Constitutional: Pt appears in NAD               HENT: Head: NCAT.                Right Ear: External ear normal.                 Left Ear: External ear normal.                Eyes: . Pupils are  equal, round, and reactive to light. Conjunctivae and EOM are normal               Nose: without d/c or deformity               Neck: Neck supple. Gross normal ROM               Cardiovascular: Normal rate and regular rhythm.                 Pulmonary/Chest: Effort normal and breath sounds without rales or wheezing.                Abd:  Soft, NT, ND, + BS, no organomegaly               Neurological: Pt is alert. At baseline orientation, motor grossly intact               Skin: Skin is warm. No rashes, no other new lesions, LE edema - none               Psychiatric: Pt behavior is normal without agitation   Micro: none  Cardiac tracings I have personally interpreted today:  none  Pertinent Radiological findings (summarize): none   Lab Results  Component Value Date   WBC 6.4 01/29/2021   HGB 14.7 01/29/2021   HCT 43.9 01/29/2021   PLT 141.0 (L) 01/29/2021   GLUCOSE 102 (H) 01/29/2021   CHOL 138 01/29/2021   TRIG 85.0 01/29/2021   HDL 51.20 01/29/2021   LDLCALC 70 01/29/2021   ALT 16 01/29/2021   AST 16 01/29/2021   NA 140 01/29/2021   K 4.3 01/29/2021   CL 104 01/29/2021   CREATININE 0.86 01/29/2021   BUN 21 01/29/2021   CO2 30 01/29/2021   TSH 0.59 01/29/2021   HGBA1C 5.6 01/29/2021   Assessment/Plan:  [name] is a 75 y.o. White or Caucasian [1] female with  has a past medical history of Allergy, EN (erythema nodosum), Hyperlipidemia, Hyperplastic colonic polyp, Hypertension, Hypothyroidism, and Obesity.  Preventative health care Age and sex appropriate education and counseling updated with regular exercise and diet Referrals for preventative services - none needed Immunizations addressed - none needed Smoking counseling  - none  needed Evidence for depression or other mood disorder - none significant Most recent labs reviewed. I have personally reviewed and have noted: 1) the patient's medical and social history 2) The patient's current medications and  supplements 3) The patient's height, weight, and BMI have been recorded in the chart   Coronary artery calcification seen on CAT scan [facility] for f/u card ct score  Followup: Return in about 1 year (around 02/03/2022).  Cathlean Cower, MD 02/08/2021 11:08 PM [facility]

## 2021-02-03 NOTE — Patient Instructions (Signed)
We have discussed the Cardiac CT Score test to measure the calcification level (if any) in your heart arteries.  This test has been ordered in our [place] [facility], so please call [facility] directly, as they prefer this, at [phone] to be scheduled.  Ok to take benadryl up to 50 mg at bedtime for sleep  Please continue all other medications as before, and refills have been done if requested.  Please have the pharmacy call with any other refills you may need.  Please continue your efforts at being more active, low cholesterol diet, and weight control.  You are otherwise up to date with prevention measures today.  Please keep your appointments with your specialists as you may have planned  Please make an Appointment to return for your 1 year visit, or sooner if needed, with Lab testing by Appointment as well, to be done about 3-5 days before at the [facility] (so this is for TWO appointments - please see the scheduling desk as you leave)  Due to the ongoing Covid 19 pandemic, our lab now requires an appointment for any labs done at our office.  If you need labs done and do not have an appointment, please call our office ahead of time to schedule before presenting to the lab for your testing.

## 2021-02-08 ENCOUNTER — Encounter: Payer: Self-pay | Admitting: Internal Medicine

## 2021-02-08 NOTE — Assessment & Plan Note (Signed)
[place] for f/u card ct score

## 2021-02-08 NOTE — Assessment & Plan Note (Signed)

## 2021-02-08 NOTE — Addendum Note (Signed)
Addended by: Biagio Borg on: 02/08/2021 11:11 PM   Modules accepted: Orders

## 2021-02-17 ENCOUNTER — Ambulatory Visit (INDEPENDENT_AMBULATORY_CARE_PROVIDER_SITE_OTHER)
Admission: RE | Admit: 2021-02-17 | Discharge: 2021-02-17 | Disposition: A | Discharge status: Home / Self Care | Payer: Self-pay | Source: Ambulatory Visit | Attending: Internal Medicine | Admitting: Internal Medicine

## 2021-02-17 ENCOUNTER — Encounter: Payer: Self-pay | Admitting: Internal Medicine

## 2021-02-17 ENCOUNTER — Other Ambulatory Visit: Payer: Self-pay | Admitting: Internal Medicine

## 2021-02-17 ENCOUNTER — Other Ambulatory Visit: Payer: Self-pay

## 2021-02-17 DIAGNOSIS — I2510 Atherosclerotic heart disease of native coronary artery without angina pectoris: Secondary | ICD-10-CM

## 2021-02-17 DIAGNOSIS — R931 Abnormal findings on diagnostic imaging of heart and coronary circulation: Secondary | ICD-10-CM

## 2021-02-17 IMAGING — CT CT CARDIAC CORONARY ARTERY CALCIUM SCORE
3 series · 14 of 20 positions shown, 15 images · non-contrast
Comparison: None.
COMPARISON: None.

Addendum:
EXAM:
OVER-READ INTERPRETATION  CT CHEST

The following report is an over-read performed by radiologist Dr.
PAUL GRACE [REDACTED] on [DATE]. This
over-read does not include interpretation of cardiac or coronary
anatomy or pathology. The coronary calcium score interpretation by
the cardiologist is attached.
CLINICAL DATA: Cardiovascular Disease Risk stratification
Coronary Calcium Score
TECHNIQUE: A gated, non-contrast computed tomography scan of the heart was
performed using 3mm slice thickness. Axial images were analyzed on a
dedicated workstation. Calcium scoring of the coronary arteries was
performed using the Agatston method.

[Series 2: casc 3.0 bv41 2 bestdiast 70 % · axial · 0.44mm/px · z∈[-234,-162]mm · 4 of 40 slices shown, 5 images]
[im 8/40  vessel]
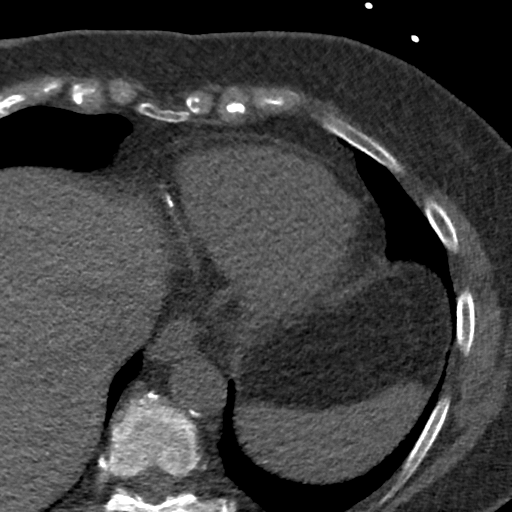
[im 8/40  lung]
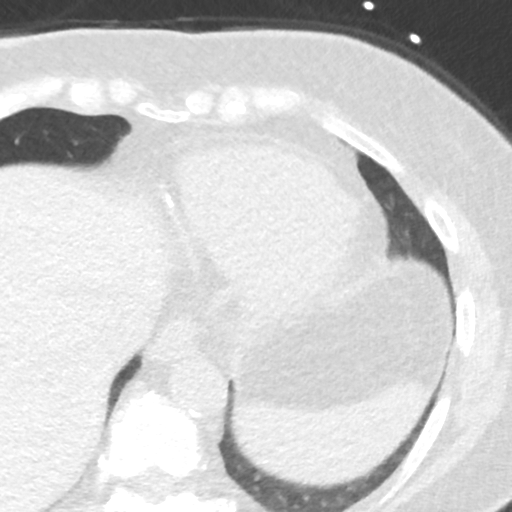
[im 16/40  vessel]
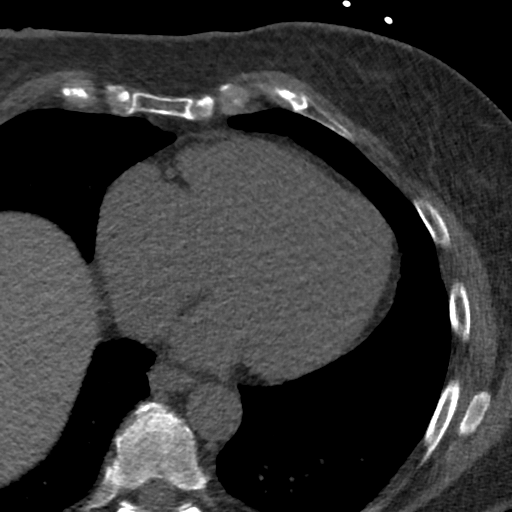
[im 24/40  vessel]
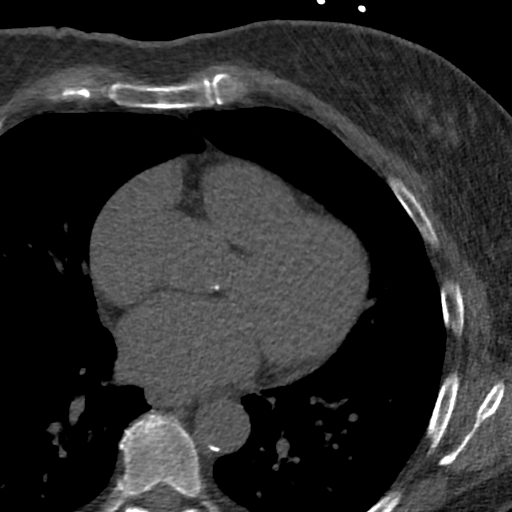
[im 32/40  vessel]
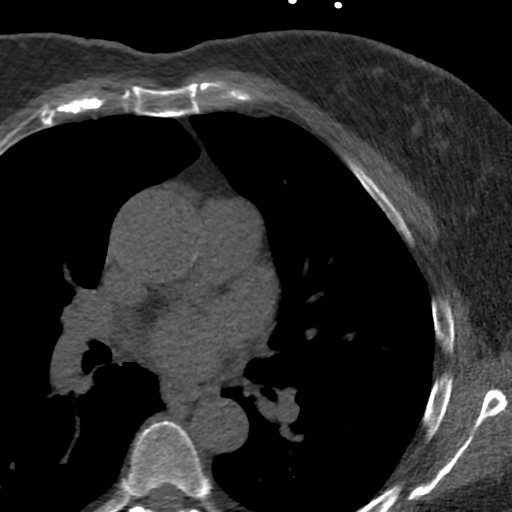

[Series 3: lung 70 % · axial · 0.68mm/px · z∈[-236,-158]mm · 5 of 40 slices shown]
[im 7/40  lung]
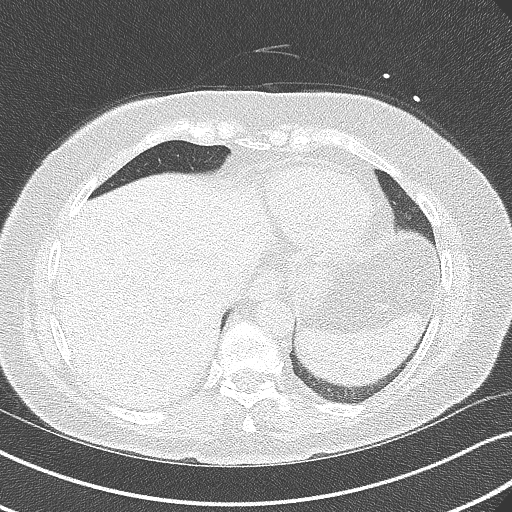
[im 14/40  lung]
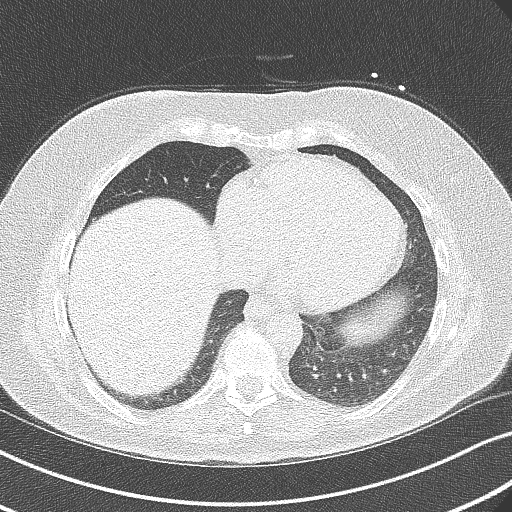
[im 20/40  lung]
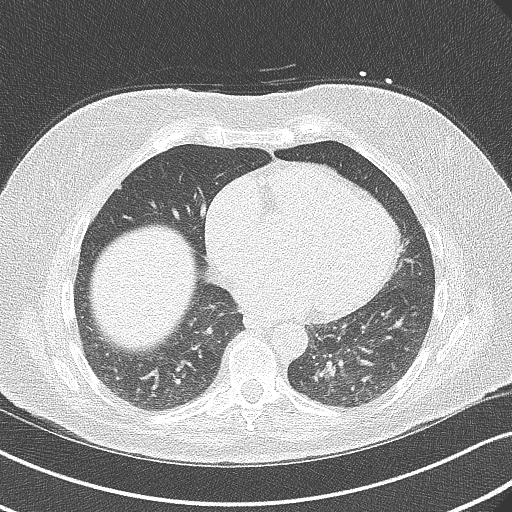
[im 27/40  lung]
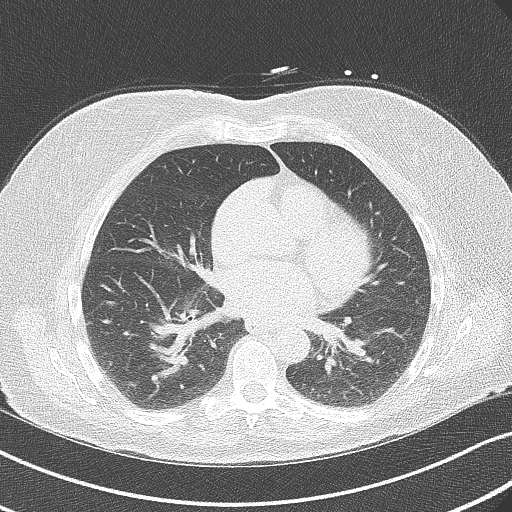
[im 33/40  lung]
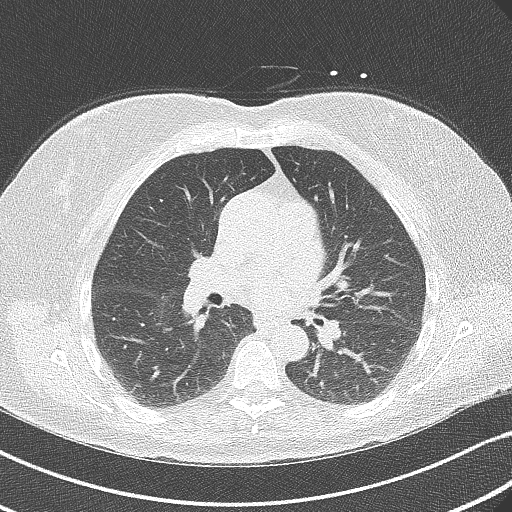

[Series 4: lung st 70 % · axial · 0.68mm/px · z∈[-236,-158]mm · 5 of 40 slices shown]
[im 7/40  lung]
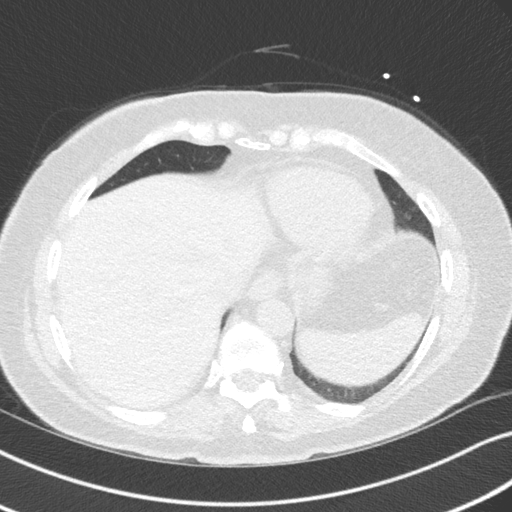
[im 14/40  lung]
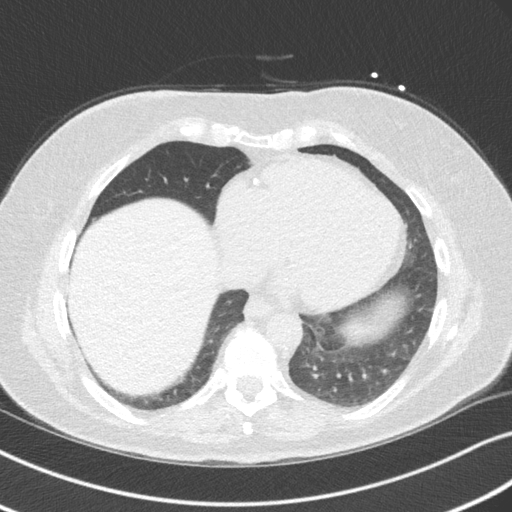
[im 20/40  lung]
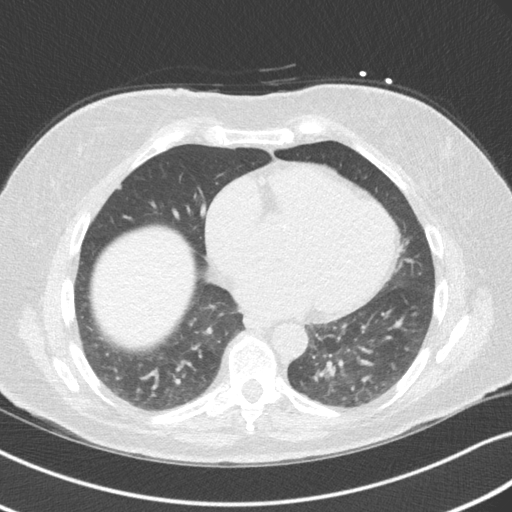
[im 27/40  lung]
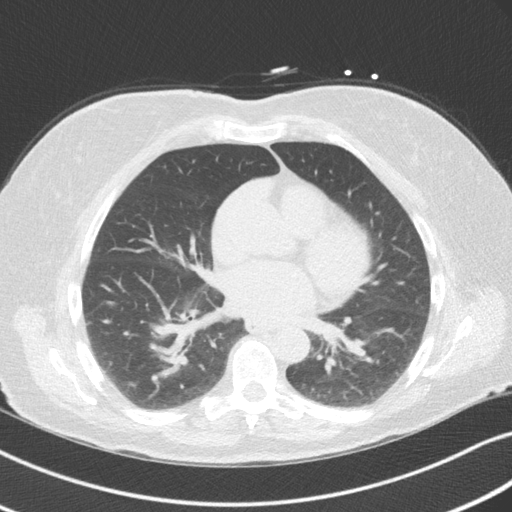
[im 33/40  lung]
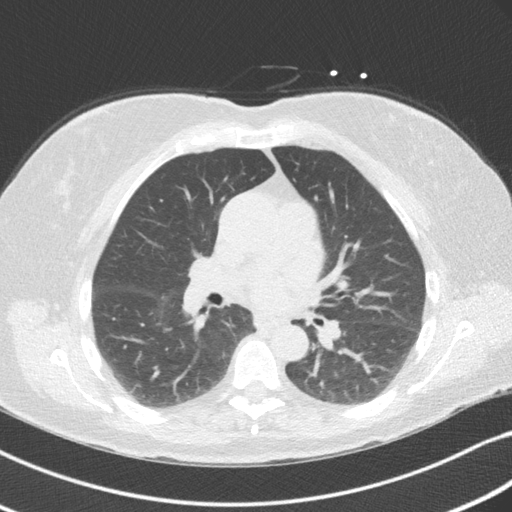

[14 of 20 positions shown; findings below may reference images not displayed]

FINDINGS: Aortic atherosclerosis. Within the visualized portions of the thorax
there are no suspicious appearing pulmonary nodules or masses, there
is no acute consolidative airspace disease, no pleural effusions, no
pneumothorax and no lymphadenopathy. Visualized portions of the
upper abdomen are unremarkable. There are no aggressive appearing
lytic or blastic lesions noted in the visualized portions of the
skeleton.
IMPRESSION: 1.  Aortic Atherosclerosis ([32]-[32]).
FINDINGS: Coronary arteries: Normal origins.

Coronary Calcium Score:

Left main: 0

Left anterior descending artery:

Left circumflex artery:

Right coronary artery: 161

Total: 258

Percentile: 80th

Pericardium: Normal.

Ascending Aorta: Borderline dilated ascending aorta at the
bifurcation of the main pulmonary artery at 38mm. Consider dedicated
gated chest CTA for further evaluation. Scattered calcifications in
the ascending and descending aorta.

Non-cardiac: See separate report from [REDACTED].
IMPRESSION: Coronary calcium score of 258. This was 80th percentile for age-,
race-, and sex-matched controls.

Borderline dilated ascending aorta at the bifurcation of the main
pulmonary artery at 38mm. Consider dedicated gated chest CTA for
further evaluation. Scattered calcifications in the ascending and
descending aorta.



If CAC=0, it is reasonable to withhold statin therapy and reassess
in 5 to 10 years, as long as higher risk conditions are absent
(diabetes mellitus, family history of premature CHD in first degree
relatives (males <55 years; females <65 years), cigarette smoking,
or LDL >=190 mg/dL).

If CAC is 1 to 99, it is reasonable to initiate statin therapy for
patients >=55 years of age.

If CAC is >=100 or >=75th percentile, it is reasonable to initiate
statin therapy at any age.

Cardiology referral should be considered for patients with CAC
scores >=400 or >=75th percentile.

*[32] AHA/ACC/AACVPR/AAPA/ABC/PAUL GRACE/PAUL GRACE/PAUL GRACE/PAUL GRACE/PAUL GRACE/PAUL GRACE/PAUL GRACE
Guideline on the Management of Blood Cholesterol: A Report of the
American College of Cardiology/American Heart Association Task Force
on Clinical Practice Guidelines. J Am Coll Cardiol.
[32];73(24):[PHONE_NUMBER].

*** End of Addendum ***
EXAM:
OVER-READ INTERPRETATION  CT CHEST

The following report is an over-read performed by radiologist Dr.
PAUL GRACE [REDACTED] on [DATE]. This
over-read does not include interpretation of cardiac or coronary
anatomy or pathology. The coronary calcium score interpretation by
the cardiologist is attached.
FINDINGS: Aortic atherosclerosis. Within the visualized portions of the thorax
there are no suspicious appearing pulmonary nodules or masses, there
is no acute consolidative airspace disease, no pleural effusions, no
pneumothorax and no lymphadenopathy. Visualized portions of the
upper abdomen are unremarkable. There are no aggressive appearing
lytic or blastic lesions noted in the visualized portions of the
skeleton.
IMPRESSION: 1.  Aortic Atherosclerosis ([32]-[32]).

## 2021-02-19 ENCOUNTER — Telehealth: Payer: Self-pay

## 2021-02-19 NOTE — Telephone Encounter (Signed)
Referral notes sent from [facility], Phone #: [phone], Fax #: [phone]   Notes sent to scheduling

## 2021-03-23 DIAGNOSIS — M9900 Segmental and somatic dysfunction of head region: Secondary | ICD-10-CM | POA: Diagnosis not present

## 2021-03-23 DIAGNOSIS — G44201 Tension-type headache, unspecified, intractable: Secondary | ICD-10-CM | POA: Diagnosis not present

## 2021-03-23 DIAGNOSIS — M9901 Segmental and somatic dysfunction of cervical region: Secondary | ICD-10-CM | POA: Diagnosis not present

## 2021-03-23 DIAGNOSIS — M542 Cervicalgia: Secondary | ICD-10-CM | POA: Diagnosis not present

## 2021-03-25 DIAGNOSIS — N281 Cyst of kidney, acquired: Secondary | ICD-10-CM | POA: Diagnosis not present

## 2021-03-25 DIAGNOSIS — M9900 Segmental and somatic dysfunction of head region: Secondary | ICD-10-CM | POA: Diagnosis not present

## 2021-03-25 DIAGNOSIS — M9901 Segmental and somatic dysfunction of cervical region: Secondary | ICD-10-CM | POA: Diagnosis not present

## 2021-03-25 DIAGNOSIS — M542 Cervicalgia: Secondary | ICD-10-CM | POA: Diagnosis not present

## 2021-03-25 DIAGNOSIS — G44201 Tension-type headache, unspecified, intractable: Secondary | ICD-10-CM | POA: Diagnosis not present

## 2021-03-29 ENCOUNTER — Encounter: Payer: Self-pay | Admitting: Internal Medicine

## 2021-04-10 DIAGNOSIS — M542 Cervicalgia: Secondary | ICD-10-CM | POA: Diagnosis not present

## 2021-04-20 DIAGNOSIS — H40052 Ocular hypertension, left eye: Secondary | ICD-10-CM | POA: Diagnosis not present

## 2021-04-20 DIAGNOSIS — H40012 Open angle with borderline findings, low risk, left eye: Secondary | ICD-10-CM | POA: Diagnosis not present

## 2021-04-20 DIAGNOSIS — H40059 Ocular hypertension, unspecified eye: Secondary | ICD-10-CM | POA: Diagnosis not present

## 2021-04-23 ENCOUNTER — Other Ambulatory Visit (INDEPENDENT_AMBULATORY_CARE_PROVIDER_SITE_OTHER): Payer: PPO

## 2021-04-23 ENCOUNTER — Encounter: Payer: Self-pay | Admitting: Internal Medicine

## 2021-04-23 ENCOUNTER — Telehealth (INDEPENDENT_AMBULATORY_CARE_PROVIDER_SITE_OTHER): Payer: PPO | Admitting: Internal Medicine

## 2021-04-23 DIAGNOSIS — E039 Hypothyroidism, unspecified: Secondary | ICD-10-CM

## 2021-04-23 DIAGNOSIS — R739 Hyperglycemia, unspecified: Secondary | ICD-10-CM

## 2021-04-23 DIAGNOSIS — F418 Other specified anxiety disorders: Secondary | ICD-10-CM | POA: Diagnosis not present

## 2021-04-23 LAB — T4, FREE: Free T4: 1.16 ng/dL (ref 0.60–1.60)

## 2021-04-23 LAB — T3, FREE: T3, Free: 2.9 pg/mL (ref 2.3–4.2)

## 2021-04-23 LAB — TSH: TSH: 1.35 u[IU]/mL (ref 0.35–5.50)

## 2021-04-23 NOTE — Patient Instructions (Signed)
Please continue all other medications as before, and refills have been done if requested.  Please have the pharmacy call with any other refills you may need.  Please continue your efforts at being more active, low cholesterol diet, and weight control.  You are otherwise up to date with prevention measures today.  Please keep your appointments with your specialists as you may have planned   

## 2021-04-23 NOTE — Progress Notes (Signed)
Patient ID: Mary Ward, female   DOB: 1945-12-08, 75 y.o.   MRN: [id number]  Virtual Visit via Video Note  I connected with Mary Ward on 04/23/21 at  8:40 AM EDT by a video enabled telemedicine application and verified that I am speaking with the correct person using two identifiers.  Location of all participants today Patient: at home Provider: at office   I discussed the limitations of evaluation and management by telemedicine and the availability of in person appointments. The patient expressed understanding and agreed to proceed.  History of Present Illness: Here to f/u, .Does c/o ongoing fatigue, but denies signficant daytime hypersomnolence.  Concerned her thyroid is off, Denies hyper or hypo thyroid symptoms such as voice, skin or hair change.  Asking for lab testing.  Pt denies chest pain, increased sob or doe, wheezing, orthopnea, PND, increased LE swelling, palpitations, dizziness or syncope.   Pt denies polydipsia, polyuria, or new focal neuro s/s.   Pt denies fever, wt loss, night sweats, loss of appetite, or other constitutional symptoms  No other new complaints.  Denies worsening depressive symptoms, suicidal ideation, or panic Past Medical History:  Diagnosis Date   Allergy    dust, dogs, grass   EN (erythema nodosum)    Hyperlipidemia    Hyperplastic colonic polyp    Hypertension    Hypothyroidism    Obesity    Past Surgical History:  Procedure Laterality Date   ABDOMINAL HYSTERECTOMY     BREAST REDUCTION SURGERY  2002   BUNIONECTOMY     both feet   COLONOSCOPY  01/22/2014   CYST EXCISION  12/15/12   punch biopsy left shoulder blade   HAMMER TOE SURGERY     right 2nd toe   LAPAROSCOPIC ASSISTED VAGINAL HYSTERECTOMY  1992   with BSO   OOPHORECTOMY     TONSILLECTOMY      reports that she has never smoked. She has never used smokeless tobacco. She reports that she does not currently use alcohol. She reports that she does not use drugs. family history includes  Cancer in her father; Diabetes in her sister; Heart attack in her mother; Heart disease in her sister; Hyperlipidemia in her mother; Hypertension in her mother and sister; Stroke in her mother. Allergies  Allergen Reactions   Indomethacin     Took with ASA and caused severe bloating   Prednisone     Developed acne   Prevnar 13 [Pneumococcal 13-Val Conj Vacc]     Arm became very sore and pt developed "fiery red knot" at site   Tetanus Toxoid     Entire arm became swollen and turned red   Tetracycline     "hot, red knots" below knees   Current Outpatient Medications on File Prior to Visit  Medication Sig Dispense Refill   aspirin 81 MG tablet Take 81 mg by mouth daily.     atorvastatin (LIPITOR) 20 MG tablet Take 1 tablet (20 mg total) by mouth daily. Annual appt due in APRIL must see provider for future refills 90 tablet 3   polyethylene glycol (MIRALAX / GLYCOLAX) 17 g packet Take 17 g by mouth daily.     SYNTHROID 125 MCG tablet Take 1 tablet (125 mcg total) by mouth daily. DAW brand 90 tablet 3   telmisartan-hydrochlorothiazide (MICARDIS HCT) 40-12.5 MG tablet Take 1 tablet by mouth daily. Annual appt due in APRIL must see provider for future refills 90 tablet 3   No current facility-administered medications on file  prior to visit.    Observations/Objective: Alert, NAD, appropriate mood and affect, resps normal, cn 2-12 intact, moves all 4s, no visible rash or swelling Lab Results  Component Value Date   WBC 6.4 01/29/2021   HGB 14.7 01/29/2021   HCT 43.9 01/29/2021   PLT 141.0 (L) 01/29/2021   GLUCOSE 102 (H) 01/29/2021   CHOL 138 01/29/2021   TRIG 85.0 01/29/2021   HDL 51.20 01/29/2021   LDLCALC 70 01/29/2021   ALT 16 01/29/2021   AST 16 01/29/2021   NA 140 01/29/2021   K 4.3 01/29/2021   CL 104 01/29/2021   CREATININE 0.86 01/29/2021   BUN 21 01/29/2021   CO2 30 01/29/2021   TSH 1.35 04/23/2021   HGBA1C 5.6 01/29/2021   Assessment and Plan: See notes  Follow Up  Instructions: See notes   I discussed the assessment and treatment plan with the patient. The patient was provided an opportunity to ask questions and all were answered. The patient agreed with the plan and demonstrated an understanding of the instructions.   The patient was advised to call back or seek an in-person evaluation if the symptoms worsen or if the condition fails to improve as anticipated.   Cathlean Cower, MD

## 2021-04-28 ENCOUNTER — Encounter: Payer: Self-pay | Admitting: Internal Medicine

## 2021-04-28 NOTE — Assessment & Plan Note (Signed)
Pt with fatigue, but little other symptoms.  Suspect more likely related to other issue, for TFTs and f/u any worsening s/s.

## 2021-04-28 NOTE — Assessment & Plan Note (Signed)
Lab Results  Component Value Date   HGBA1C 5.6 01/29/2021   Stable, pt to continue current medical treatment  - diet

## 2021-04-28 NOTE — Assessment & Plan Note (Signed)
Overall stable, declines further change in tx

## 2021-05-05 ENCOUNTER — Other Ambulatory Visit: Payer: Self-pay

## 2021-05-05 ENCOUNTER — Encounter: Payer: Self-pay | Admitting: Cardiology

## 2021-05-05 ENCOUNTER — Ambulatory Visit: Payer: PPO | Admitting: Cardiology

## 2021-05-05 VITALS — BP 128/72 | HR 58 | Ht 65.5 in | Wt 166.8 lb

## 2021-05-05 DIAGNOSIS — I77819 Aortic ectasia, unspecified site: Secondary | ICD-10-CM | POA: Diagnosis not present

## 2021-05-05 DIAGNOSIS — R9431 Abnormal electrocardiogram [ECG] [EKG]: Secondary | ICD-10-CM

## 2021-05-05 DIAGNOSIS — E785 Hyperlipidemia, unspecified: Secondary | ICD-10-CM

## 2021-05-05 DIAGNOSIS — I2510 Atherosclerotic heart disease of native coronary artery without angina pectoris: Secondary | ICD-10-CM | POA: Diagnosis not present

## 2021-05-05 DIAGNOSIS — I10 Essential (primary) hypertension: Secondary | ICD-10-CM | POA: Diagnosis not present

## 2021-05-05 NOTE — Progress Notes (Signed)
Cardiology Office Note:    Date:  05/06/2021   ID:  Mary Ward, DOB 1946/05/22, MRN [id number]  PCP:  Biagio Borg, MD  Cardiologist:  None  Electrophysiologist:  None   Referring MD: Biagio Borg, MD   Chief Complaint  Patient presents with   Coronary Artery Disease    History of Present Illness:    Mary Ward is a 75 y.o. female with a hx of hypertension, hypothyroidism, hyperlipidemia, erythema nodosum who is referred by Dr. Jenny Reichmann for evaluation of coronary calcifications.  She denies any chest pain or dyspnea.  Reports she walks 2.5 miles 4-5 times a week and also does aerobic workouts.  No exertional symptoms.  She denies any lightheadedness, syncope, lower extremity edema.  Reports has very rare episodes of palpitations lasting few seconds but may be years between episodes.  Reports blood pressure has been well controlled.  She lost 55 pounds since last year with weight watchers.  No smoking history.  Family history includes mother had MI in 31s and sister died of MI at early 48s.  Calcium score on 02/17/2021 was 258 (80th percentile).  Also with borderline aortic dilatation measuring 38 mm.  Underwent Lexiscan Myoview in 11/2017 which showed normal perfusion, EF 68%.  Past Medical History:  Diagnosis Date   Allergy    dust, dogs, grass   EN (erythema nodosum)    Hyperlipidemia    Hyperplastic colonic polyp    Hypertension    Hypothyroidism    Obesity     Past Surgical History:  Procedure Laterality Date   ABDOMINAL HYSTERECTOMY     BREAST REDUCTION SURGERY  2002   BUNIONECTOMY     both feet   COLONOSCOPY  01/22/2014   CYST EXCISION  12/15/12   punch biopsy left shoulder blade   HAMMER TOE SURGERY     right 2nd toe   LAPAROSCOPIC ASSISTED VAGINAL HYSTERECTOMY  1992   with BSO   OOPHORECTOMY     TONSILLECTOMY      Current Medications: Current Meds  Medication Sig   aspirin 81 MG tablet Take 81 mg by mouth daily.   atorvastatin (LIPITOR) 20 MG tablet Take 1  tablet (20 mg total) by mouth daily. Annual appt due in APRIL must see provider for future refills   meloxicam (MOBIC) 15 MG tablet Take 15 mg by mouth daily.   polyethylene glycol (MIRALAX / GLYCOLAX) 17 g packet Take 17 g by mouth daily.   SYNTHROID 125 MCG tablet Take 1 tablet (125 mcg total) by mouth daily. DAW brand   telmisartan-hydrochlorothiazide (MICARDIS HCT) 40-12.5 MG tablet Take 1 tablet by mouth daily. Annual appt due in APRIL must see provider for future refills     Allergies:   Indomethacin, Prednisone, Prevnar 13 [pneumococcal 13-val conj vacc], Tetanus toxoid, and Tetracycline   Social History   Socioeconomic History   Marital status: Married    Spouse name: Not on file   Number of children: 0   Years of education: Not on file   Highest education level: High school graduate  Occupational History   Occupation: Psychologist, occupational: [employer]  Tobacco Use   Smoking status: Never   Smokeless tobacco: Never  Vaping Use   Vaping Use: Never used  Substance and Sexual Activity   Alcohol use: Not Currently    Alcohol/week: 0.0 standard drinks   Drug use: No   Sexual activity: Never    Birth control/protection: Post-menopausal,  Surgical    Comment: Hysterectomy  Other Topics Concern   Not on file  Social History Narrative   Family history of high Cholesterol   Social Determinants of Health   Financial Resource Strain: Low Risk    Difficulty of Paying Living Expenses: Not hard at all  Food Insecurity: No Food Insecurity   Worried About Charity fundraiser in the Last Year: Never true   [place] in the Last Year: Never true  Transportation Needs: No Transportation Needs   Lack of Transportation (Medical): No   Lack of Transportation (Non-Medical): No  Physical Activity: Sufficiently Active   Days of Exercise per Week: 4 days   Minutes of Exercise per Session: 60 min  Stress: Stress Concern Present   Feeling of Stress : Very much   Social Connections: Not on file     Family History: The patient's family history includes Cancer in her father; Diabetes in her sister; Heart attack in her mother; Heart disease in her sister; Hyperlipidemia in her mother; Hypertension in her mother and sister; Stroke in her mother. There is no history of Colon cancer, Esophageal cancer, Rectal cancer, Stomach cancer, or Colon polyps.  ROS:   Please see the history of present illness.     All other systems reviewed and are negative.  EKGs/Labs/Other Studies Reviewed:    The following studies were reviewed today:   EKG:  EKG is  ordered today.  The ekg ordered today demonstrates sinus rhythm, rate 58, poor R wave progression, low voltage  Recent Labs: 01/29/2021: ALT 16; BUN 21; Creatinine, Ser 0.86; Hemoglobin 14.7; Platelets 141.0; Potassium 4.3; Sodium 140 04/23/2021: TSH 1.35  Recent Lipid Panel    Component Value Date/Time   CHOL 138 01/29/2021 0849   TRIG 85.0 01/29/2021 0849   TRIG 85 07/28/2006 0740   HDL 51.20 01/29/2021 0849   CHOLHDL 3 01/29/2021 0849   VLDL 17.0 01/29/2021 0849   LDLCALC 70 01/29/2021 0849    Physical Exam:    VS:  BP 128/72   Pulse (!) 58   Ht 5' 5.5" (1.664 m)   Wt 166 lb 12.8 oz (75.7 kg)   LMP 09/13/1994   SpO2 96%   BMI 27.33 kg/m     Wt Readings from Last 3 Encounters:  05/05/21 166 lb 12.8 oz (75.7 kg)  02/03/21 165 lb (74.8 kg)  07/07/20 178 lb (80.7 kg)     GEN:  Well nourished, well developed in no acute distress HEENT: Normal NECK: No JVD; No carotid bruits LYMPHATICS: No lymphadenopathy CARDIAC: RRR, no murmurs, rubs, gallops RESPIRATORY:  Clear to auscultation without rales, wheezing or rhonchi  ABDOMEN: Soft, non-tender, non-distended MUSCULOSKELETAL:  No edema; No deformity  SKIN: Warm and dry NEUROLOGIC:  Alert and oriented x 3 PSYCHIATRIC:  Normal affect   ASSESSMENT:    1. Coronary artery disease involving native coronary artery of native heart without angina  pectoris   2. Abnormal EKG   3. Aortic dilatation (HCC)   4. Essential hypertension   5. Hyperlipidemia, unspecified hyperlipidemia type    PLAN:    CAD: Calcium score on 02/17/2021 was 258 (80th percentile).  Denies any anginal symptoms. -Continue atorvastatin 20 mg daily.  LDL 70 on 01/29/2021 -Continue aspirin 81 mg daily  Abnormal EKG: Poor R wave progression.  Will check echocardiogram.  Aortic dilatation: Borderline ascending aortic dilatation noted on noncontrast chest CT for calcium score.  Will assess aortic dilatation with echocardiogram as above and plan  CTA chest to follow aortic dilatation in 1 year.  Hypertension: On telmisartan-hydrochlorothiazide 40-12.5 mg daily.  Appears controlled  Hyperlipidemia: On atorvastatin 20 mg daily LDL 70 on 01/29/2021  RTC in 1 year   Medication Adjustments/Labs and Tests Ordered: Current medicines are reviewed at length with the patient today.  Concerns regarding medicines are outlined above.  Orders Placed This Encounter  Procedures   CT ANGIO CHEST AORTA W/CM & OR WO/CM   EKG 12-Lead   ECHOCARDIOGRAM COMPLETE   No orders of the defined types were placed in this encounter.   Patient Instructions  Medication Instructions:  Your physician recommends that you continue on your current medications as directed. Please refer to the Current Medication list given to you today.  *If you need a refill on your cardiac medications before your next appointment, please call your pharmacy*  Testing/Procedures: Your physician has requested that you have an echocardiogram. Echocardiography is a painless test that uses sound waves to create images of your heart. It provides your doctor with information about the size and shape of your heart and how well your heart's chambers and valves are working. This procedure takes approximately one hour. There are no restrictions for this procedure. This will be done at our [facility] location:  [address]  CTA chest/aorta in 12 MONTHS  Follow-Up: At [facility], you and your health needs are our priority.  As part of our continuing mission to provide you with exceptional heart care, we have created designated Provider Care Teams.  These Care Teams include your primary Cardiologist (physician) and Advanced Practice Providers (APPs -  Physician Assistants and Nurse Practitioners) who all work together to provide you with the care you need, when you need it.  We recommend signing up for the patient portal called "MyChart".  Sign up information is provided on this After Visit Summary.  MyChart is used to connect with patients for Virtual Visits (Telemedicine).  Patients are able to view lab/test results, encounter notes, upcoming appointments, etc.  Non-urgent messages can be sent to your provider as well.   To learn more about what you can do with MyChart, go to [email or web address].    Your next appointment:   12 month(s)  The format for your next appointment:   In Person  Provider:   Oswaldo Milian, MD    Signed, Donato Heinz, MD  05/06/2021 6:48 AM    [place]

## 2021-05-05 NOTE — Patient Instructions (Signed)
Medication Instructions:  Your physician recommends that you continue on your current medications as directed. Please refer to the Current Medication list given to you today.  *If you need a refill on your cardiac medications before your next appointment, please call your pharmacy*  Testing/Procedures: Your physician has requested that you have an echocardiogram. Echocardiography is a painless test that uses sound waves to create images of your heart. It provides your doctor with information about the size and shape of your heart and how well your heart's chambers and valves are working. This procedure takes approximately one hour. There are no restrictions for this procedure. This will be done at our [facility] location:  [place] [address]  CTA chest/aorta in 12 MONTHS  Follow-Up: At [facility], you and your health needs are our priority.  As part of our continuing mission to provide you with exceptional heart care, we have created designated Provider Care Teams.  These Care Teams include your primary Cardiologist (physician) and Advanced Practice Providers (APPs -  Physician Assistants and Nurse Practitioners) who all work together to provide you with the care you need, when you need it.  We recommend signing up for the patient portal called "MyChart".  Sign up information is provided on this After Visit Summary.  MyChart is used to connect with patients for Virtual Visits (Telemedicine).  Patients are able to view lab/test results, encounter notes, upcoming appointments, etc.  Non-urgent messages can be sent to your provider as well.   To learn more about what you can do with MyChart, go to [email or web address].    Your next appointment:   12 month(s)  The format for your next appointment:   In Person  Provider:   Oswaldo Milian, MD

## 2021-05-08 ENCOUNTER — Other Ambulatory Visit: Payer: Self-pay

## 2021-05-08 ENCOUNTER — Ambulatory Visit (HOSPITAL_COMMUNITY): Payer: PPO | Attending: Internal Medicine

## 2021-05-08 DIAGNOSIS — R9431 Abnormal electrocardiogram [ECG] [EKG]: Secondary | ICD-10-CM | POA: Diagnosis not present

## 2021-05-08 LAB — ECHOCARDIOGRAM COMPLETE
Area-P 1/2: 3.37 cm2
Calc EF: 63.5 %
S' Lateral: 2.7 cm
Single Plane A2C EF: 64.6 %
Single Plane A4C EF: 62.4 %

## 2021-05-14 ENCOUNTER — Telehealth: Payer: Self-pay | Admitting: Cardiology

## 2021-05-14 NOTE — Telephone Encounter (Signed)
Pt called and we reviewed the following recommendations from Dr. Denna Haggard regarding her echo:  "Normal left heart pumping function, though right ventricular function appears mildly reduced.  Mild aortic dilatation, which was seen on CT scan.  No other significant abnormalities."  She had no additional questions.

## 2021-05-14 NOTE — Telephone Encounter (Signed)
° ° °  Pt is calling to get echo result °

## 2021-06-26 ENCOUNTER — Ambulatory Visit (INDEPENDENT_AMBULATORY_CARE_PROVIDER_SITE_OTHER): Payer: PPO

## 2021-06-26 DIAGNOSIS — Z23 Encounter for immunization: Secondary | ICD-10-CM | POA: Diagnosis not present

## 2021-06-26 NOTE — Progress Notes (Signed)
Pt given High Dose Flu vacc w/o any complications.

## 2021-06-27 ENCOUNTER — Ambulatory Visit: Payer: PPO

## 2021-08-05 DIAGNOSIS — L720 Epidermal cyst: Secondary | ICD-10-CM | POA: Diagnosis not present

## 2021-08-21 ENCOUNTER — Ambulatory Visit (INDEPENDENT_AMBULATORY_CARE_PROVIDER_SITE_OTHER): Payer: PPO

## 2021-08-21 ENCOUNTER — Other Ambulatory Visit: Payer: Self-pay

## 2021-08-21 VITALS — BP 122/80 | HR 64 | Temp 97.9°F | Resp 16 | Ht 66.0 in | Wt 166.4 lb

## 2021-08-21 DIAGNOSIS — Z0000 Encounter for general adult medical examination without abnormal findings: Secondary | ICD-10-CM | POA: Diagnosis not present

## 2021-08-21 NOTE — Progress Notes (Signed)
Subjective:   Mary Ward is a 75 y.o. female who presents for Medicare Annual (Subsequent) preventive examination.  Review of Systems     Cardiac Risk Factors include: advanced age (>41men, >64 women);dyslipidemia;family history of premature cardiovascular disease;hypertension     Objective:    Today's Vitals   08/21/21 1138  BP: 122/80  Pulse: 64  Resp: 16  Temp: 97.9 F (36.6 C)  TempSrc: Temporal  SpO2: 97%  Weight: 166 lb 6.4 oz (75.5 kg)  Height: 5\' 6"  (1.676 m)  PainSc: 0-No pain   Body mass index is 26.86 kg/m.  Advanced Directives 08/21/2021 07/07/2020 04/07/2020 01/08/2014  Does Patient Have a Medical Advance Directive? Yes Yes No Patient has advance directive, copy not in chart  Type of Advance Directive Living will;Healthcare Power of [place];Living will - -  Does patient want to make changes to medical advance directive? No - Patient declined No - Patient declined - -  Copy of [place] in Chart? No - copy requested No - copy requested - -    Current Medications (verified) Outpatient Encounter Medications as of 08/21/2021  Medication Sig   aspirin 81 MG tablet Take 81 mg by mouth daily.   atorvastatin (LIPITOR) 20 MG tablet Take 1 tablet (20 mg total) by mouth daily. Annual appt due in APRIL must see provider for future refills   polyethylene glycol (MIRALAX / GLYCOLAX) 17 g packet Take 17 g by mouth daily.   SYNTHROID 125 MCG tablet Take 1 tablet (125 mcg total) by mouth daily. DAW brand   telmisartan-hydrochlorothiazide (MICARDIS HCT) 40-12.5 MG tablet Take 1 tablet by mouth daily. Annual appt due in APRIL must see provider for future refills   [DISCONTINUED] meloxicam (MOBIC) 15 MG tablet Take 15 mg by mouth daily.   No facility-administered encounter medications on file as of 08/21/2021.    Allergies (verified) Indomethacin, Prednisone, Prevnar 13 [pneumococcal 13-val conj vacc], Tetanus toxoid, and  Tetracycline   History: Past Medical History:  Diagnosis Date   Allergy    dust, dogs, grass   EN (erythema nodosum)    Hyperlipidemia    Hyperplastic colonic polyp    Hypertension    Hypothyroidism    Obesity    Past Surgical History:  Procedure Laterality Date   ABDOMINAL HYSTERECTOMY     BREAST REDUCTION SURGERY  2002   BUNIONECTOMY     both feet   COLONOSCOPY  01/22/2014   CYST EXCISION  12/15/12   punch biopsy left shoulder blade   HAMMER TOE SURGERY     right 2nd toe   LAPAROSCOPIC ASSISTED VAGINAL HYSTERECTOMY  1992   with BSO   OOPHORECTOMY     TONSILLECTOMY     Family History  Problem Relation Age of Onset   Hyperlipidemia Mother    Stroke Mother    Hypertension Mother    Heart attack Mother    Cancer Father        Lung   Diabetes Sister    Heart disease Sister        CAD   Hypertension Sister    Colon cancer Neg Hx    Esophageal cancer Neg Hx    Rectal cancer Neg Hx    Stomach cancer Neg Hx    Colon polyps Neg Hx    Social History   Socioeconomic History   Marital status: Married    Spouse name: Not on file   Number of children: 0  Years of education: Not on file   Highest education level: High school graduate  Occupational History   Occupation: Psychologist, occupational: [employer]  Tobacco Use   Smoking status: Never   Smokeless tobacco: Never  Vaping Use   Vaping Use: Never used  Substance and Sexual Activity   Alcohol use: Not Currently    Alcohol/week: 0.0 standard drinks   Drug use: No   Sexual activity: Never    Birth control/protection: Post-menopausal, Surgical    Comment: Hysterectomy  Other Topics Concern   Not on file  Social History Narrative   Family history of high Cholesterol   Social Determinants of Health   Financial Resource Strain: Low Risk    Difficulty of Paying Living Expenses: Not hard at all  Food Insecurity: No Food Insecurity   Worried About Charity fundraiser in the Last Year: Never true    Ran Out of Food in the Last Year: Never true  Transportation Needs: No Transportation Needs   Lack of Transportation (Medical): No   Lack of Transportation (Non-Medical): No  Physical Activity: Sufficiently Active   Days of Exercise per Week: 5 days   Minutes of Exercise per Session: 60 min  Stress: No Stress Concern Present   Feeling of Stress : Not at all  Social Connections: Socially Integrated   Frequency of Communication with Friends and Family: More than three times a week   Frequency of Social Gatherings with Friends and Family: Once a week   Attends Religious Services: More than 4 times per year   Active Member of [employer] or Organizations: Yes   Attends Music therapist: More than 4 times per year   Marital Status: Married    Tobacco Counseling Counseling given: Not Answered   Clinical Intake:  Pre-visit preparation completed: Yes  Pain : No/denies pain Pain Score: 0-No pain     BMI - recorded: 26.86 Nutritional Status: BMI 25 -29 Overweight Nutritional Risks: None Diabetes: No  How often do you need to have someone help you when you read instructions, pamphlets, or other written materials from your doctor or pharmacy?: 1 - Never What is the last grade level you completed in school?: HSG, Some College Courses  Diabetic? no  Interpreter Needed?: No  Information entered by :: Lisette Abu, LPN   Activities of Daily Living In your present state of health, do you have any difficulty performing the following activities: 08/21/2021  Hearing? N  Vision? N  Difficulty concentrating or making decisions? N  Walking or climbing stairs? N  Dressing or bathing? N  Doing errands, shopping? N  Preparing Food and eating ? N  Using the Toilet? N  In the past six months, have you accidently leaked urine? N  Do you have problems with loss of bowel control? N  Managing your Medications? N  Managing your Finances? N  Housekeeping or managing your  Housekeeping? N  Some recent data might be hidden    Patient Care Team: Biagio Borg, MD as PCP - General Kennith [place], RD as Dietitian (Family Medicine) Marica Otter, [place] as Consulting Physician (Optometry)  Indicate any recent Medical Services you may have received from other than [facility] providers in the past year (date may be approximate).     Assessment:   This is a routine wellness examination for [place].  Hearing/Vision screen Hearing Screening - Comments:: Patient denied any hearing difficulty.   No hearing aids.  Vision Screening -  Comments:: Patient wears corrective glasses/contacts.  Eye exam done annually by: Marica Otter, OD.  Dietary issues and exercise activities discussed: Current Exercise Habits: Home exercise routine;Structured exercise class, Type of exercise: walking, Time (Minutes): 60, Frequency (Times/Week): 5, Weekly Exercise (Minutes/Week): 300, Intensity: Moderate   Goals Addressed               This Visit's Progress     Patient Stated (pt-stated)        Continue to weight watchers.      Depression Screen PHQ 2/9 Scores 08/21/2021 02/03/2021 02/03/2021 07/07/2020 04/11/2020 01/09/2019 01/03/2018  PHQ - 2 Score 0 0 0 0 0 0 0  PHQ- 9 Score - - 0 - - - 0    Fall Risk Fall Risk  08/21/2021 02/03/2021 02/03/2021 07/07/2020 04/11/2020  Falls in the past year? 0 0 0 0 0  Number falls in past yr: 0 0 0 0 0  Injury with Fall? 0 0 0 0 0  Risk for fall due to : No Fall Risks - - No Fall Risks No Fall Risks  Follow up Falls prevention discussed - - Falls evaluation completed;Education provided Falls evaluation completed    FALL RISK PREVENTION PERTAINING TO THE HOME:  Any stairs in or around the home? Yes  If so, are there any without handrails? No  Home free of loose throw rugs in walkways, pet beds, electrical cords, etc? Yes  Adequate lighting in your home to reduce risk of falls? Yes   ASSISTIVE DEVICES UTILIZED TO PREVENT FALLS:  Life alert? No  Use  of a cane, walker or w/c? No  Grab bars in the bathroom? No  Shower chair or bench in shower? Yes  Elevated toilet seat or a handicapped toilet? Yes   TIMED UP AND GO:  Was the test performed? Yes .  Length of time to ambulate 10 feet: 6 sec.   Gait steady and fast without use of assistive device  Cognitive Function: Normal cognitive status assessed by direct observation by this Nurse Health Advisor. No abnormalities found.          Immunizations Immunization History  Administered Date(s) Administered   Fluad Quad(high Dose 65+) 06/23/2019, 07/02/2020, 06/26/2021   Influenza Whole 07/30/1999, 07/14/2006, 07/03/2007, 06/13/2008, 06/13/2009   Influenza, High Dose Seasonal PF 06/23/2018   Influenza, Seasonal, Injecte, Preservative Fre 06/13/2013   Influenza-Unspecified 06/20/2014, 06/19/2019   PFIZER(Purple Top)SARS-COV-2 Vaccination 10/18/2019, 11/12/2019, 06/09/2020   Pfizer Covid-19 Vaccine Bivalent Booster 52yrs & up 06/04/2021   Pneumococcal Conjugate-13 11/16/2013   Pneumococcal Polysaccharide-23 08/07/2007, 11/15/2012   Pneumococcal-Unspecified 03/02/2018   Td 09/13/1994   Varicella 02/[birth year]/1952   Zoster Recombinat (Shingrix) 03/21/2019, 05/22/2019   Zoster, Live 01/01/2008, 05/29/2019    TDAP status: declined (allergic)  Flu Vaccine status: Up to date  Pneumococcal vaccine status: Up to date  Covid-19 vaccine status: Completed vaccines  Qualifies for Shingles Vaccine? Yes   Zostavax completed Yes   Shingrix Completed?: Yes  Screening Tests Health Maintenance  Topic Date Due   MAMMOGRAM  12/30/2021   COLONOSCOPY (Pts 45-61yrs Insurance coverage will need to be confirmed)  04/04/2026   Pneumonia Vaccine 56+ Years old  Completed   INFLUENZA VACCINE  Completed   DEXA SCAN  Completed   COVID-19 Vaccine  Completed   Hepatitis C Screening  Completed   Zoster Vaccines- Shingrix  Completed   HPV VACCINES  Aged Out   TETANUS/TDAP  Discontinued    Health  Maintenance  There are no preventive care  reminders to display for this patient.   Colorectal cancer screening: Type of screening: Colonoscopy. Completed 04/05/2019. Repeat every 7 years  Mammogram status: Completed 12/31/2019. Repeat every year  Bone Density status: Completed 03/28/2019. Results reflect: Bone density results: NORMAL. Repeat every 5 years.  Lung Cancer Screening: (Low Dose CT Chest recommended if Age 80-80 years, 30 pack-year currently smoking OR have quit w/in 15years.) does not qualify.   Lung Cancer Screening Referral: no  Additional Screening:  Hepatitis C Screening: does qualify; Completed yes  Vision Screening: Recommended annual ophthalmology exams for early detection of glaucoma and other disorders of the eye. Is the patient up to date with their annual eye exam?  Yes  Who is the provider or what is the name of the office in which the patient attends annual eye exams? Marica Otter, OD. If pt is not established with a provider, would they like to be referred to a provider to establish care? No .   Dental Screening: Recommended annual dental exams for proper oral hygiene  Community Resource Referral / Chronic Care Management: CRR required this visit?  No   CCM required this visit?  No      Plan:     I have personally reviewed and noted the following in the patient's chart:   Medical and social history Use of alcohol, tobacco or illicit drugs  Current medications and supplements including opioid prescriptions.  Functional ability and status Nutritional status Physical activity Advanced directives List of other physicians Hospitalizations, surgeries, and ER visits in previous 12 months Vitals Screenings to include cognitive, depression, and falls Referrals and appointments  In addition, I have reviewed and discussed with patient certain preventive protocols, quality metrics, and best practice recommendations. A written personalized care plan for  preventive services as well as general preventive health recommendations were provided to patient.     Sheral Flow, LPN   [id number]   Nurse Notes:  Hearing Screening - Comments:: Patient denied any hearing difficulty.   No hearing aids.  Vision Screening - Comments:: Patient wears corrective glasses/contacts.  Eye exam done annually by: Marica Otter, OD.

## 2021-08-21 NOTE — Patient Instructions (Signed)
Mary Ward , Thank you for taking time to come for your Medicare Wellness Visit. I appreciate your ongoing commitment to your health goals. Please review the following plan we discussed and let me know if I can assist you in the future.   Screening recommendations/referrals: Colonoscopy: 04/05/2019; due every 7 years (due 04/04/2026) Mammogram: 12/31/2019; due every 1-2 years Bone Density: 03/28/2019; due every 5 years (normal results) Recommended yearly ophthalmology/optometry visit for glaucoma screening and checkup Recommended yearly dental visit for hygiene and checkup  Vaccinations: Influenza vaccine: 06/26/2021 Pneumococcal vaccine: 03/02/2018 Tdap vaccine: declined Shingles vaccine: 03/21/2019, 05/22/2019   Covid-19: 10/18/2019, 11/12/2019, 06/09/2020, 06/04/2021  Advanced directives: Please bring a copy of your health care power of attorney and living will to the office at your convenience.  Conditions/risks identified: Yes; continue with Weight Watchers.  Next appointment: Scheduled for 08/23/2022 at 10:00 am.   Preventive Care 65 Years and Older, Female Preventive care refers to lifestyle choices and visits with your health care provider that can promote health and wellness. What does preventive care include? A yearly physical exam. This is also called an annual well check. Dental exams once or twice a year. Routine eye exams. Ask your health care provider how often you should have your eyes checked. Personal lifestyle choices, including: Daily care of your teeth and gums. Regular physical activity. Eating a healthy diet. Avoiding tobacco and drug use. Limiting alcohol use. Practicing safe sex. Taking low-dose aspirin every day. Taking vitamin and mineral supplements as recommended by your health care provider. What happens during an annual well check? The services and screenings done by your health care provider during your annual well check will depend on your age, overall health,  lifestyle risk factors, and family history of disease. Counseling  Your health care provider may ask you questions about your: Alcohol use. Tobacco use. Drug use. Emotional well-being. Home and relationship well-being. Sexual activity. Eating habits. History of falls. Memory and ability to understand (cognition). Work and work Statistician. Reproductive health. Screening  You may have the following tests or measurements: Height, weight, and BMI. Blood pressure. Lipid and cholesterol levels. These may be checked every 5 years, or more frequently if you are over 74 years old. Skin check. Lung cancer screening. You may have this screening every year starting at age 74 if you have a 30-pack-year history of smoking and currently smoke or have quit within the past 15 years. Fecal occult blood Ward (FOBT) of the stool. You may have this Ward every year starting at age 74. Flexible sigmoidoscopy or colonoscopy. You may have a sigmoidoscopy every 5 years or a colonoscopy every 10 years starting at age 74. Hepatitis C blood Ward. Hepatitis B blood Ward. Sexually transmitted disease (STD) testing. Diabetes screening. This is done by checking your blood sugar (glucose) after you have not eaten for a while (fasting). You may have this done every 1-3 years. Bone density scan. This is done to screen for osteoporosis. You may have this done starting at age 74. Mammogram. This may be done every 1-2 years. Talk to your health care provider about how often you should have regular mammograms. Talk with your health care provider about your Ward results, treatment options, and if necessary, the need for more tests. Vaccines  Your health care provider may recommend certain vaccines, such as: Influenza vaccine. This is recommended every year. Tetanus, diphtheria, and acellular pertussis (Tdap, Td) vaccine. You may need a Td booster every 10 years. Zoster vaccine. You may need  this after age  74. Pneumococcal 13-valent conjugate (PCV13) vaccine. One dose is recommended after age 74. Pneumococcal polysaccharide (PPSV23) vaccine. One dose is recommended after age 74. Talk to your health care provider about which screenings and vaccines you need and how often you need them. This information is not intended to replace advice given to you by your health care provider. Make sure you discuss any questions you have with your health care provider. Document Released: 09/26/2015 Document Revised: 05/19/2016 Document Reviewed: 07/01/2015 Elsevier Interactive Patient Education  2017 [place] Prevention in the Home Falls can cause injuries. They can happen to people of all ages. There are many things you can do to make your home safe and to help prevent falls. What can I do on the outside of my home? Regularly fix the edges of walkways and driveways and fix any cracks. Remove anything that might make you trip as you walk through a door, such as a raised step or threshold. Trim any bushes or trees on the path to your home. Use bright outdoor lighting. Clear any walking paths of anything that might make someone trip, such as rocks or tools. Regularly check to see if handrails are loose or broken. Make sure that both sides of any steps have handrails. Any raised decks and porches should have guardrails on the edges. Have any leaves, snow, or ice cleared regularly. Use sand or salt on walking paths during winter. Clean up any spills in your garage right away. This includes oil or grease spills. What can I do in the bathroom? Use night lights. Install grab bars by the toilet and in the tub and shower. Do not use towel bars as grab bars. Use non-skid mats or decals in the tub or shower. If you need to sit down in the shower, use a plastic, non-slip stool. Keep the floor dry. Clean up any water that spills on the floor as soon as it happens. Remove soap buildup in the tub or shower  regularly. Attach bath mats securely with double-sided non-slip rug tape. Do not have throw rugs and other things on the floor that can make you trip. What can I do in the bedroom? Use night lights. Make sure that you have a light by your bed that is easy to reach. Do not use any sheets or blankets that are too big for your bed. They should not hang down onto the floor. Have a firm chair that has side arms. You can use this for support while you get dressed. Do not have throw rugs and other things on the floor that can make you trip. What can I do in the kitchen? Clean up any spills right away. Avoid walking on wet floors. Keep items that you use a lot in easy-to-reach places. If you need to reach something above you, use a strong step stool that has a grab bar. Keep electrical cords out of the way. Do not use floor polish or wax that makes floors slippery. If you must use wax, use non-skid floor wax. Do not have throw rugs and other things on the floor that can make you trip. What can I do with my stairs? Do not leave any items on the stairs. Make sure that there are handrails on both sides of the stairs and use them. Fix handrails that are broken or loose. Make sure that handrails are as [place] as the stairways. Check any carpeting to make sure that it is firmly attached to  the stairs. Fix any carpet that is loose or worn. Avoid having throw rugs at the top or bottom of the stairs. If you do have throw rugs, attach them to the floor with carpet tape. Make sure that you have a light switch at the top of the stairs and the bottom of the stairs. If you do not have them, ask someone to add them for you. What else can I do to help prevent falls? Wear shoes that: Do not have high heels. Have rubber bottoms. Are comfortable and fit you well. Are closed at the toe. Do not wear sandals. If you use a stepladder: Make sure that it is fully opened. Do not climb a closed stepladder. Make sure that  both sides of the stepladder are locked into place. Ask someone to hold it for you, if possible. Clearly mark and make sure that you can see: Any grab bars or handrails. First and last steps. Where the edge of each step is. Use tools that help you move around (mobility aids) if they are needed. These include: Canes. Walkers. Scooters. Crutches. Turn on the lights when you go into a dark area. Replace any light bulbs as soon as they burn out. Set up your furniture so you have a clear path. Avoid moving your furniture around. If any of your floors are uneven, fix them. If there are any pets around you, be aware of where they are. Review your medicines with your doctor. Some medicines can make you feel dizzy. This can increase your chance of falling. Ask your doctor what other things that you can do to help prevent falls. This information is not intended to replace advice given to you by your health care provider. Make sure you discuss any questions you have with your health care provider. Document Released: 06/26/2009 Document Revised: 02/05/2016 Document Reviewed: 10/04/2014 Elsevier Interactive Patient Education  2017 [employer].

## 2021-10-29 DIAGNOSIS — L438 Other lichen planus: Secondary | ICD-10-CM | POA: Diagnosis not present

## 2021-10-29 DIAGNOSIS — L821 Other seborrheic keratosis: Secondary | ICD-10-CM | POA: Diagnosis not present

## 2021-11-09 DIAGNOSIS — N281 Cyst of kidney, acquired: Secondary | ICD-10-CM | POA: Diagnosis not present

## 2021-11-11 ENCOUNTER — Other Ambulatory Visit: Payer: Self-pay | Admitting: Urology

## 2021-11-11 DIAGNOSIS — K862 Cyst of pancreas: Secondary | ICD-10-CM

## 2021-11-11 DIAGNOSIS — N281 Cyst of kidney, acquired: Secondary | ICD-10-CM

## 2021-11-26 ENCOUNTER — Ambulatory Visit
Admission: RE | Admit: 2021-11-26 | Discharge: 2021-11-26 | Disposition: A | Discharge status: Home / Self Care | Payer: PPO | Source: Ambulatory Visit | Attending: Urology | Admitting: Urology

## 2021-11-26 DIAGNOSIS — K862 Cyst of pancreas: Secondary | ICD-10-CM | POA: Diagnosis not present

## 2021-11-26 DIAGNOSIS — N281 Cyst of kidney, acquired: Secondary | ICD-10-CM | POA: Diagnosis not present

## 2021-11-26 DIAGNOSIS — K7689 Other specified diseases of liver: Secondary | ICD-10-CM | POA: Diagnosis not present

## 2021-11-26 DIAGNOSIS — K5730 Diverticulosis of large intestine without perforation or abscess without bleeding: Secondary | ICD-10-CM | POA: Diagnosis not present

## 2021-11-26 IMAGING — MR MR ABDOMEN WO/W CM
11 of 19 series · 24 of 48 positions shown · IV contrast (multihance)
Comparison: MRI abdomen [DATE]

CLINICAL DATA: Pancreatic cyst

EXAM:
MRI ABDOMEN WITHOUT AND WITH CONTRAST
TECHNIQUE: Multiplanar multisequence MR imaging of the abdomen was performed
both before and after the administration of intravenous contrast.
CONTRAST:  15mL MULTIHANCE GADOBENATE DIMEGLUMINE 529 MG/ML IV SOLN

[Series 4: cor haste · coronal · 5.0mm · 0.70mm/px · 2 of 35 slices shown]
[im 1/35]
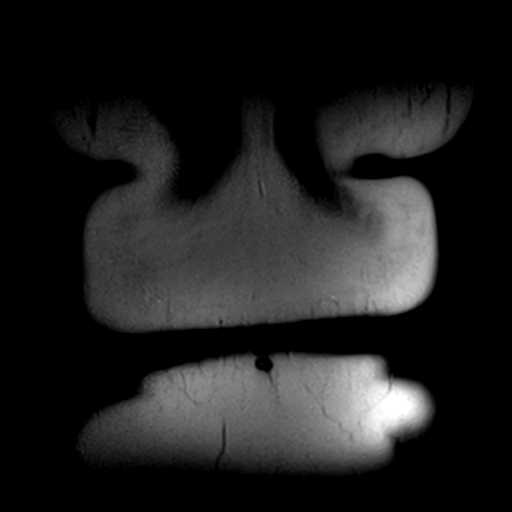
[im 35/35]
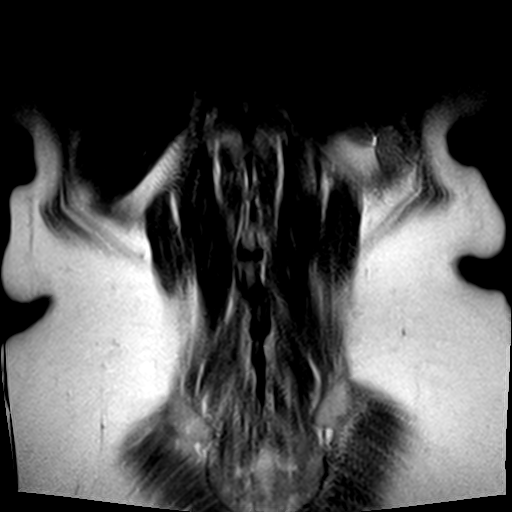

[Series 5: haste axial · axial · 5.0mm · 0.70mm/px · z∈[-28,+179]mm · 2 of 34 slices shown]
[im 1/34]
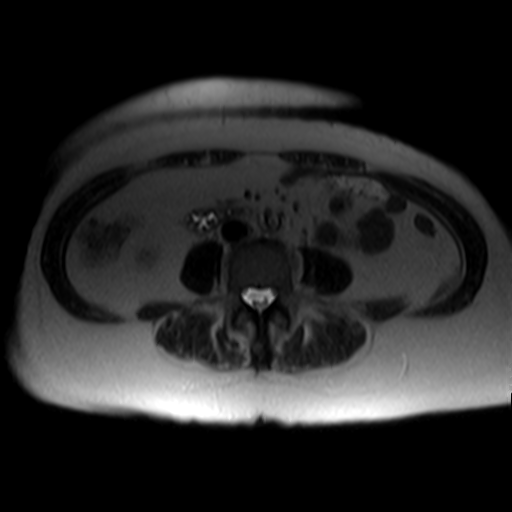
[im 34/34]
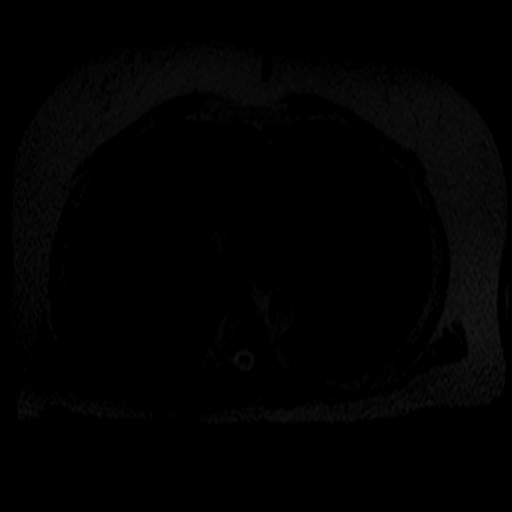

[Series 6: T1 · axial · 5.0mm · 0.70mm/px · z∈[-39,+177]mm · 3 of 74 slices shown]
[im 1/74]
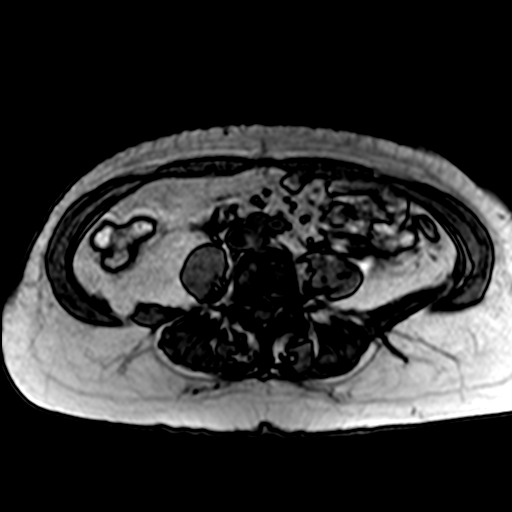
[im 37/74]
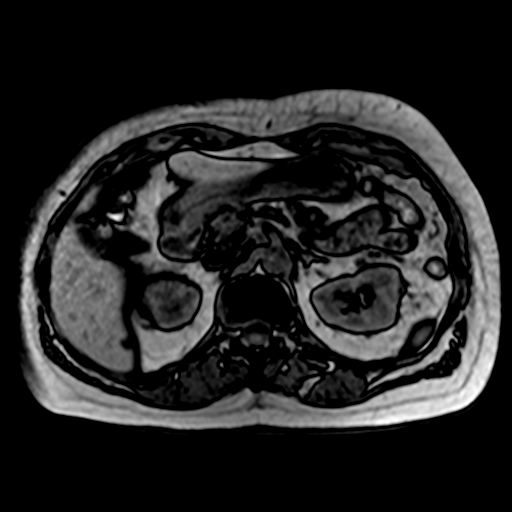
[im 74/74]
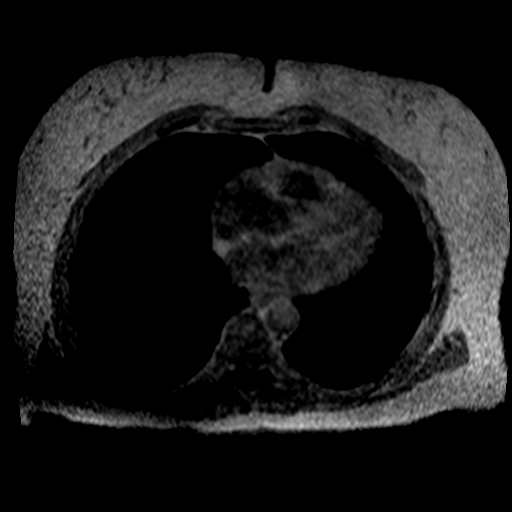

[Series 7: T2 · coronal · 3.0mm · 0.70mm/px · 2 of 55 slices shown (1 of 2)]
[im 1/55]
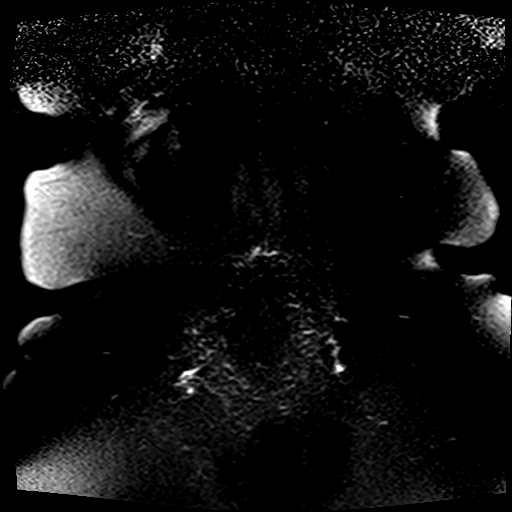
[im 55/55]
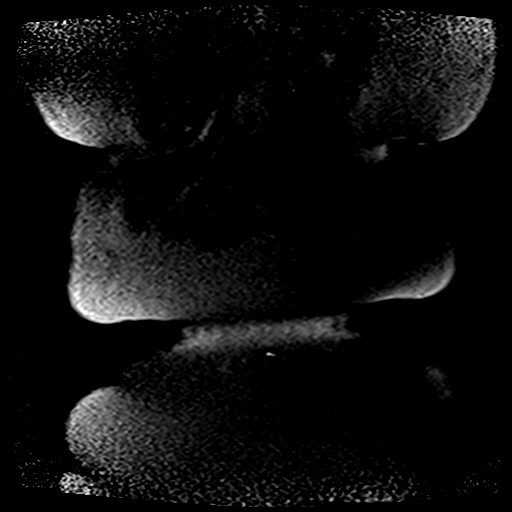

[Series 8: bSSFP · axial · 5.0mm · 0.70mm/px · 1 of 33 slices shown]
[im 1/33]
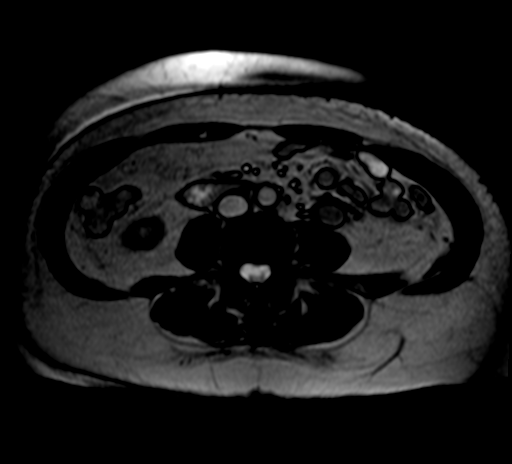

[Series 9: T2 · axial · 5.0mm · 0.94mm/px · 1 of 35 slices shown (2 of 2)]
[im 1/35]
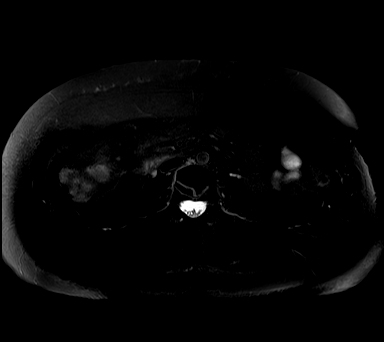

[Series 14: [id number]_trig · axial · 5.0mm · 1.88mm/px · z∈[-1,+212]mm · 4 of 105 slices shown]
[im 1/105]
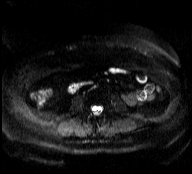
[im 35/105]
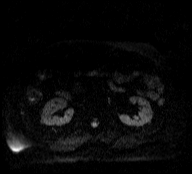
[im 70/105]
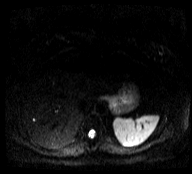
[im 105/105]
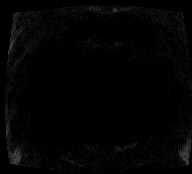

[Series 15: [id number]_trig_adc · axial · 5.0mm · 1.88mm/px · 1 of 35 slices shown]
[im 1/35]
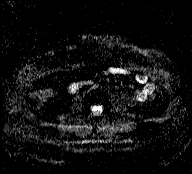

[Series 16: T1 dynamic · axial · non-contrast · 2.5mm · 0.70mm/px · z∈[-33,+185]mm · 3 of 88 slices shown]
[im 1/88]
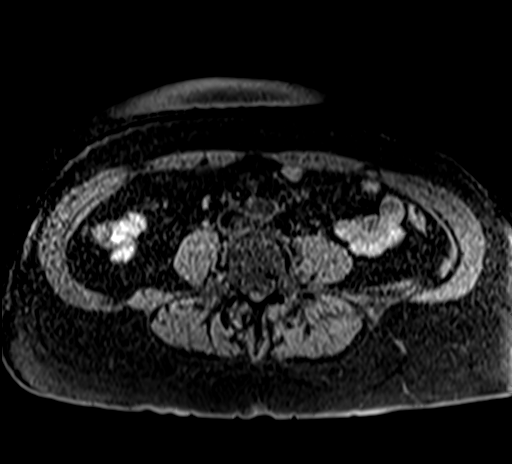
[im 44/88]
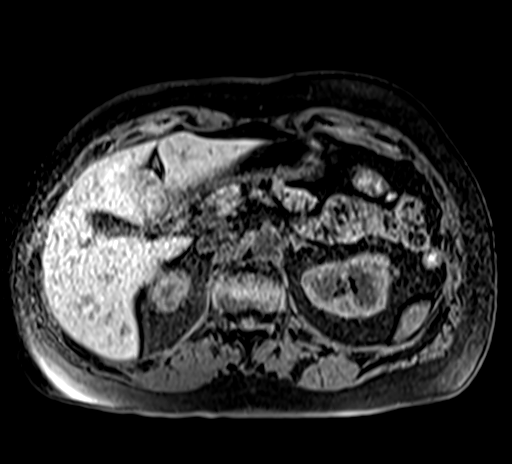
[im 88/88]
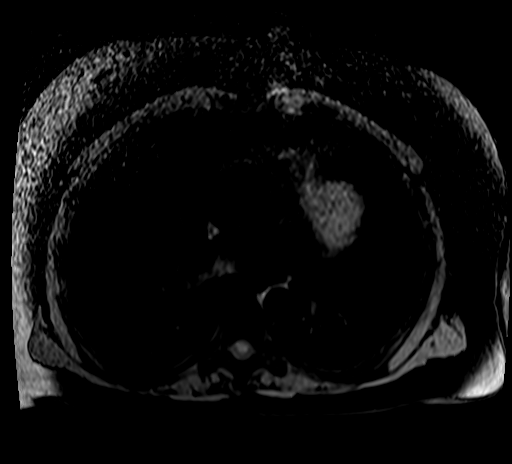

[Series 17: T1 dynamic post-contrast · axial · 2.5mm · 0.70mm/px · z∈[-33,+185]mm · 3 of 88 slices shown (1 of 2)]
[im 1/88]
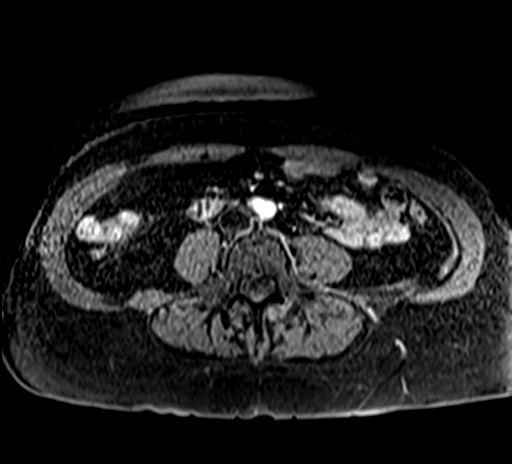
[im 44/88]
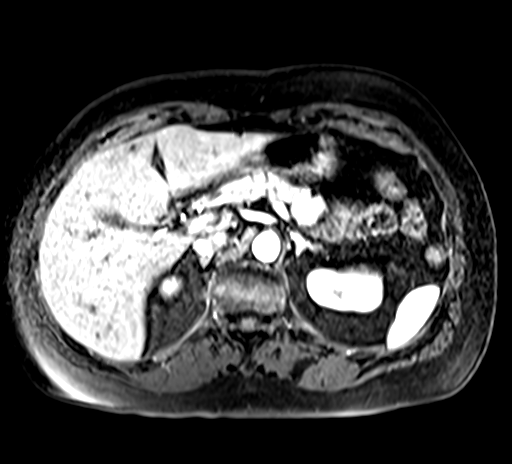
[im 88/88]
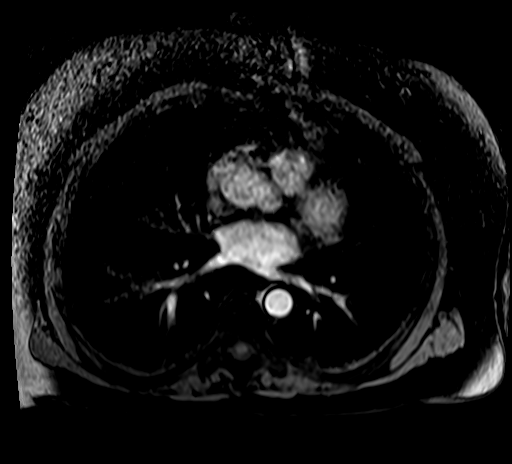

[Series 18: T1 dynamic post-contrast · axial · 2.5mm · 0.70mm/px · z∈[-33,+75]mm · 2 of 88 slices shown (2 of 2)]
[im 1/88]
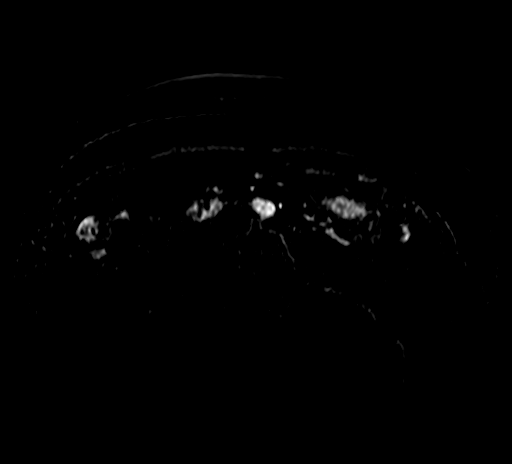
[im 44/88]
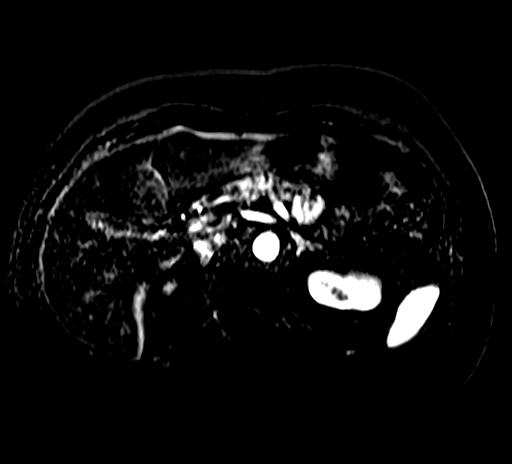

[24 of 48 positions shown; findings below may reference images not displayed]

FINDINGS: Lower chest: No acute findings.

Hepatobiliary: Liver is normal in size and contour. No evidence of
hepatic steatosis. Multiple small hepatic cysts scattered throughout
the liver measuring up to 1 cm in size. Gallbladder appears normal.
No biliary ductal dilatation identified.

Pancreas: Stable 5 mm cystic lesion in the uncinate process of the
pancreas. No new suspicious enhancement or nodularity visualized. No
pancreatic ductal dilatation.

Spleen:  Within normal limits in size and appearance.

Adrenals/Urinary Tract: Adrenal glands appear normal. 2.2 cm
hemorrhagic cyst in the posterior right kidney. No enhancing renal
mass or hydronephrosis identified bilaterally.

Stomach/Bowel: Colonic diverticulosis.  No bowel obstruction.

Vascular/Lymphatic: No pathologically enlarged lymph nodes
identified. No abdominal aortic aneurysm demonstrated.

Other:  No ascites.

Musculoskeletal: No suspicious bone lesions identified.
IMPRESSION: 1. Stable 5 mm cystic lesion in the uncinate process of the
pancreas. Recommend continued follow-up with MRI in 2 years.
2. Multiple small hepatic cysts.
3. Hemorrhagic right renal cyst.
4. Colonic diverticulosis.

## 2021-11-26 MED ORDER — GADOBENATE DIMEGLUMINE 529 MG/ML IV SOLN
15.0000 mL | Freq: Once | INTRAVENOUS | Status: AC | PRN
  Administered 2021-11-26: 15 mL via INTRAVENOUS

## 2021-12-31 DIAGNOSIS — L438 Other lichen planus: Secondary | ICD-10-CM | POA: Diagnosis not present

## 2021-12-31 DIAGNOSIS — L814 Other melanin hyperpigmentation: Secondary | ICD-10-CM | POA: Diagnosis not present

## 2021-12-31 DIAGNOSIS — L821 Other seborrheic keratosis: Secondary | ICD-10-CM | POA: Diagnosis not present

## 2021-12-31 DIAGNOSIS — D1801 Hemangioma of skin and subcutaneous tissue: Secondary | ICD-10-CM | POA: Diagnosis not present

## 2021-12-31 DIAGNOSIS — D225 Melanocytic nevi of trunk: Secondary | ICD-10-CM | POA: Diagnosis not present

## 2022-01-19 ENCOUNTER — Ambulatory Visit (INDEPENDENT_AMBULATORY_CARE_PROVIDER_SITE_OTHER): Payer: PPO | Admitting: Internal Medicine

## 2022-01-19 ENCOUNTER — Encounter: Payer: Self-pay | Admitting: Internal Medicine

## 2022-01-19 VITALS — BP 124/68 | HR 62 | Temp 99.0°F | Ht 66.0 in | Wt 169.0 lb

## 2022-01-19 DIAGNOSIS — R109 Unspecified abdominal pain: Secondary | ICD-10-CM | POA: Diagnosis not present

## 2022-01-19 DIAGNOSIS — I10 Essential (primary) hypertension: Secondary | ICD-10-CM | POA: Diagnosis not present

## 2022-01-19 DIAGNOSIS — R739 Hyperglycemia, unspecified: Secondary | ICD-10-CM | POA: Diagnosis not present

## 2022-01-19 LAB — BASIC METABOLIC PANEL
BUN: 20 mg/dL (ref 6–23)
CO2: 29 mEq/L (ref 19–32)
Calcium: 9 mg/dL (ref 8.4–10.5)
Chloride: 102 mEq/L (ref 96–112)
Creatinine, Ser: 0.86 mg/dL (ref 0.40–1.20)
GFR: 66.53 mL/min (ref >60.00)
Glucose, Bld: 100 mg/dL — ABNORMAL HIGH (ref 70–99)
Potassium: 3.6 mEq/L (ref 3.5–5.1)
Sodium: 138 mEq/L (ref 135–145)

## 2022-01-19 LAB — CBC WITH DIFFERENTIAL/PLATELET
Basophils Absolute: 0 10*3/uL (ref 0.0–0.1)
Basophils Relative: 0.5 % (ref 0.0–3.0)
Eosinophils Absolute: 0.1 10*3/uL (ref 0.0–0.7)
Eosinophils Relative: 0.6 % (ref 0.0–5.0)
HCT: 42.2 % (ref 36.0–46.0)
Hemoglobin: 14.2 g/dL (ref 12.0–15.0)
Lymphocytes Relative: 26.6 % (ref 12.0–46.0)
Lymphs Abs: 2.4 10*3/uL (ref 0.7–4.0)
MCHC: 33.6 g/dL (ref 30.0–36.0)
MCV: 84.9 fl (ref 78.0–100.0)
Monocytes Absolute: 0.6 10*3/uL (ref 0.1–1.0)
Monocytes Relative: 6.6 % (ref 3.0–12.0)
Neutro Abs: 5.9 10*3/uL (ref 1.4–7.7)
Neutrophils Relative %: 65.7 % (ref 43.0–77.0)
Platelets: 161 10*3/uL (ref 150.0–400.0)
RBC: 4.97 Mil/uL (ref 3.87–5.11)
RDW: 12.8 % (ref 11.5–15.5)
WBC: 9.1 10*3/uL (ref 4.0–10.5)

## 2022-01-19 LAB — HEPATIC FUNCTION PANEL
ALT: 21 U/L (ref 0–35)
AST: 18 U/L (ref 0–37)
Albumin: 4.3 g/dL (ref 3.5–5.2)
Alkaline Phosphatase: 74 U/L (ref 39–117)
Bilirubin, Direct: 0.1 mg/dL (ref 0.0–0.3)
Total Bilirubin: 0.4 mg/dL (ref 0.2–1.2)
Total Protein: 7.2 g/dL (ref 6.0–8.3)

## 2022-01-19 LAB — LIPASE: Lipase: 12 U/L (ref 11.0–59.0)

## 2022-01-19 NOTE — Progress Notes (Signed)
Patient ID: Mary Ward, female   DOB: 03/19/1946, 76 y.o.   MRN: [id number] ? ? ? ?    Chief Complaint: follow up GI symptoms ? ?     HPI:  Mary Ward is a 76 y.o. female here with c/o somewhat vague GI symptoms - admits to 10 days of increased rich high fat foot, popcorn with oily stools, abd distension, gas and especially concerned about small caliber stools after reading on the internet.  Has fleeting twinges of pain more to the left and mid abdomen, temp 99 today.  Denies worsening reflux, other abd pain, dysphagia, n/v, or blood.  Pt denies chest pain, increased sob or doe, wheezing, orthopnea, PND, increased LE swelling, palpitations, dizziness or syncope.   Pt denies polydipsia, polyuria, or new focal neuro s/s.    Pt denies recent wt loss, night sweats, loss of appetite, or other constitutional symptoms  Last colonoscopy July 2020 - 2 small polyps benign, diverticulosis, internal hemorrhoid.  ?Wt Readings from Last 3 Encounters:  ?01/19/22 169 lb (76.7 kg)  ?08/21/21 166 lb 6.4 oz (75.5 kg)  ?05/05/21 166 lb 12.8 oz (75.7 kg)  ? ?BP Readings from Last 3 Encounters:  ?01/19/22 124/68  ?08/21/21 122/80  ?05/05/21 128/72  ? ?      ?Past Medical History:  ?Diagnosis Date  ? Allergy   ? dust, dogs, grass  ? EN (erythema nodosum)   ? Hyperlipidemia   ? Hyperplastic colonic polyp   ? Hypertension   ? Hypothyroidism   ? Obesity   ? ?Past Surgical History:  ?Procedure Laterality Date  ? ABDOMINAL HYSTERECTOMY    ? BREAST REDUCTION SURGERY  2002  ? BUNIONECTOMY    ? both feet  ? COLONOSCOPY  01/22/2014  ? CYST EXCISION  12/15/12  ? punch biopsy left shoulder blade  ? HAMMER TOE SURGERY    ? right 2nd toe  ? LAPAROSCOPIC ASSISTED VAGINAL HYSTERECTOMY  1992  ? with BSO  ? OOPHORECTOMY    ? TONSILLECTOMY    ? ? reports that she has never smoked. She has never used smokeless tobacco. She reports that she does not currently use alcohol. She reports that she does not use drugs. ?family history includes Cancer in her father;  Diabetes in her sister; Heart attack in her mother; Heart disease in her sister; Hyperlipidemia in her mother; Hypertension in her mother and sister; Stroke in her mother. ?Allergies  ?Allergen Reactions  ? Indomethacin   ?  Took with ASA and caused severe bloating  ? Prednisone   ?  Developed acne  ? Prevnar 13 [Pneumococcal 13-Val Conj Vacc]   ?  Arm became very sore and pt developed "fiery red knot" at site  ? Tetanus Toxoid   ?  Entire arm became swollen and turned red  ? Tetracycline   ?  "hot, red knots" below knees  ? ?Current Outpatient Medications on File Prior to Visit  ?Medication Sig Dispense Refill  ? aspirin 81 MG tablet Take 81 mg by mouth daily.    ? atorvastatin (LIPITOR) 20 MG tablet Take 1 tablet (20 mg total) by mouth daily. Annual appt due in APRIL must see provider for future refills 90 tablet 3  ? polyethylene glycol (MIRALAX / GLYCOLAX) 17 g packet Take 17 g by mouth daily.    ? SYNTHROID 125 MCG tablet Take 1 tablet (125 mcg total) by mouth daily. DAW brand 90 tablet 3  ? telmisartan-hydrochlorothiazide (MICARDIS HCT) 40-12.5 MG tablet  Take 1 tablet by mouth daily. Annual appt due in APRIL must see provider for future refills 90 tablet 3  ? ?No current facility-administered medications on file prior to visit.  ? ?     ROS:  All others reviewed and negative. ? ?Objective  ? ?     PE:  BP 124/68 (BP Location: Right Arm, Patient Position: Sitting, Cuff Size: Large)   Pulse 62   Temp 99 ?F (37.2 ?C) (Oral)   Ht '5\' 6"'$  (1.676 m)   Wt 169 lb (76.7 kg)   LMP 09/13/1994   SpO2 97%   BMI 27.28 kg/m?  ? ?              Constitutional: Pt appears in NAD ?              HENT: Head: NCAT.  ?              Right Ear: External ear normal.   ?              Left Ear: External ear normal.  ?              Eyes: . Pupils are equal, round, and reactive to light. Conjunctivae and EOM are normal ?              Nose: without d/c or deformity ?              Neck: Neck supple. Gross normal ROM ?               Cardiovascular: Normal rate and regular rhythm.   ?              Pulmonary/Chest: Effort normal and breath sounds without rales or wheezing.  ?              Abd:  Soft, NT, ND, + BS, no organomegaly - benign ?              Neurological: Pt is alert. At baseline orientation, motor grossly intact ?              Skin: Skin is warm. No rashes, no other new lesions, LE edema - none ?              Psychiatric: Pt behavior is normal without agitation , 2+ anxiety ? ?Micro: none ? ?Cardiac tracings I have personally interpreted today:  none ? ?Pertinent Radiological findings (summarize): none  ? ?Lab Results  ?Component Value Date  ? WBC 9.1 01/19/2022  ? HGB 14.2 01/19/2022  ? HCT 42.2 01/19/2022  ? PLT 161.0 01/19/2022  ? GLUCOSE 100 (H) 01/19/2022  ? CHOL 138 01/29/2021  ? TRIG 85.0 01/29/2021  ? HDL 51.20 01/29/2021  ? [place] 70 01/29/2021  ? ALT 21 01/19/2022  ? AST 18 01/19/2022  ? NA 138 01/19/2022  ? K 3.6 01/19/2022  ? CL 102 01/19/2022  ? CREATININE 0.86 01/19/2022  ? BUN 20 01/19/2022  ? CO2 29 01/19/2022  ? TSH 1.35 04/23/2021  ? HGBA1C 5.6 01/29/2021  ? ?Assessment/Plan:  ?Mary Ward is a 76 y.o. White or Caucasian [1] female with  has a past medical history of Allergy, EN (erythema nodosum), Hyperlipidemia, Hyperplastic colonic polyp, Hypertension, Hypothyroidism, and Obesity. ? ?Abdominal pain ?Recent onset, etiology unclear, exam benign, for labs and consider CT if leukocytosis ro colitis, consider stool studies as well such as GI panel, r/o c diff ? ?Hyperglycemia ?Lab Results  ?Component Value  Date  ? HGBA1C 5.6 01/29/2021  ? ?Stable, pt to continue current medical treatment  - diet ? ? ?Essential hypertension ?BP Readings from Last 3 Encounters:  ?01/19/22 124/68  ?08/21/21 122/80  ?05/05/21 128/72  ? ?Stable, pt to continue medical treatment micardis hct ? ?Followup: Return if symptoms worsen or fail to improve. ? ?Mary Cower, MD 01/23/2022 1:14 PM ?[facility] ?[facility] ?

## 2022-01-19 NOTE — Patient Instructions (Signed)
Please continue all other medications as before, and refills have been done if requested.  Please have the pharmacy call with any other refills you may need.  Please keep your appointments with your specialists as you may have planned  Please go to the LAB at the blood drawing area for the tests to be done  You will be contacted by phone if any changes need to be made immediately.  Otherwise, you will receive a letter about your results with an explanation, but please check with MyChart first.  Please remember to sign up for MyChart if you have not done so, as this will be important to you in the future with finding out test results, communicating by private email, and scheduling acute appointments online when needed.      

## 2022-01-20 LAB — URINALYSIS, ROUTINE W REFLEX MICROSCOPIC
Bilirubin Urine: NEGATIVE
Hgb urine dipstick: NEGATIVE
Ketones, ur: NEGATIVE
Nitrite: NEGATIVE
RBC / HPF: NONE SEEN (ref 0–?)
Specific Gravity, Urine: 1.025 (ref 1.000–1.030)
Total Protein, Urine: NEGATIVE
Urine Glucose: NEGATIVE
Urobilinogen, UA: 0.2 (ref 0.0–1.0)
pH: 5.5 (ref 5.0–8.0)

## 2022-01-23 ENCOUNTER — Encounter: Payer: Self-pay | Admitting: Internal Medicine

## 2022-01-23 NOTE — Assessment & Plan Note (Signed)
BP Readings from Last 3 Encounters:  ?01/19/22 124/68  ?08/21/21 122/80  ?05/05/21 128/72  ? ?Stable, pt to continue medical treatment micardis hct ? ?

## 2022-01-23 NOTE — Assessment & Plan Note (Signed)
Recent onset, etiology unclear, exam benign, for labs and consider CT if leukocytosis ro colitis, consider stool studies as well such as GI panel, r/o c diff ?

## 2022-01-23 NOTE — Assessment & Plan Note (Signed)
Lab Results  ?Component Value Date  ? HGBA1C 5.6 01/29/2021  ? ?Stable, pt to continue current medical treatment  - diet ? ?

## 2022-01-29 ENCOUNTER — Other Ambulatory Visit (INDEPENDENT_AMBULATORY_CARE_PROVIDER_SITE_OTHER): Payer: PPO

## 2022-01-29 DIAGNOSIS — Z0000 Encounter for general adult medical examination without abnormal findings: Secondary | ICD-10-CM

## 2022-01-29 DIAGNOSIS — R739 Hyperglycemia, unspecified: Secondary | ICD-10-CM | POA: Diagnosis not present

## 2022-01-29 LAB — CBC WITH DIFFERENTIAL/PLATELET
Basophils Absolute: 0 10*3/uL (ref 0.0–0.1)
Basophils Relative: 0.7 % (ref 0.0–3.0)
Eosinophils Absolute: 0.1 10*3/uL (ref 0.0–0.7)
Eosinophils Relative: 1.8 % (ref 0.0–5.0)
HCT: 42.4 % (ref 36.0–46.0)
Hemoglobin: 14.2 g/dL (ref 12.0–15.0)
Lymphocytes Relative: 34 % (ref 12.0–46.0)
Lymphs Abs: 2.4 10*3/uL (ref 0.7–4.0)
MCHC: 33.4 g/dL (ref 30.0–36.0)
MCV: 85.5 fl (ref 78.0–100.0)
Monocytes Absolute: 0.5 10*3/uL (ref 0.1–1.0)
Monocytes Relative: 7.1 % (ref 3.0–12.0)
Neutro Abs: 4 10*3/uL (ref 1.4–7.7)
Neutrophils Relative %: 56.4 % (ref 43.0–77.0)
Platelets: 137 10*3/uL — ABNORMAL LOW (ref 150.0–400.0)
RBC: 4.96 Mil/uL (ref 3.87–5.11)
RDW: 12.7 % (ref 11.5–15.5)
WBC: 7 10*3/uL (ref 4.0–10.5)

## 2022-01-29 LAB — TSH: TSH: 0.76 u[IU]/mL (ref 0.35–5.50)

## 2022-01-29 LAB — HEPATIC FUNCTION PANEL
ALT: 16 U/L (ref 0–35)
AST: 14 U/L (ref 0–37)
Albumin: 4 g/dL (ref 3.5–5.2)
Alkaline Phosphatase: 72 U/L (ref 39–117)
Bilirubin, Direct: 0.1 mg/dL (ref 0.0–0.3)
Total Bilirubin: 0.6 mg/dL (ref 0.2–1.2)
Total Protein: 6.6 g/dL (ref 6.0–8.3)

## 2022-01-29 LAB — BASIC METABOLIC PANEL
BUN: 19 mg/dL (ref 6–23)
CO2: 29 mEq/L (ref 19–32)
Calcium: 9.2 mg/dL (ref 8.4–10.5)
Chloride: 105 mEq/L (ref 96–112)
Creatinine, Ser: 0.77 mg/dL (ref 0.40–1.20)
GFR: 75.95 mL/min (ref >60.00)
Glucose, Bld: 96 mg/dL (ref 70–99)
Potassium: 4.3 mEq/L (ref 3.5–5.1)
Sodium: 139 mEq/L (ref 135–145)

## 2022-01-29 LAB — URINALYSIS, ROUTINE W REFLEX MICROSCOPIC
Bilirubin Urine: NEGATIVE
Hgb urine dipstick: NEGATIVE
Ketones, ur: NEGATIVE
Leukocytes,Ua: NEGATIVE
Nitrite: NEGATIVE
RBC / HPF: NONE SEEN (ref 0–?)
Specific Gravity, Urine: 1.015 (ref 1.000–1.030)
Total Protein, Urine: NEGATIVE
Urine Glucose: NEGATIVE
Urobilinogen, UA: 0.2 (ref 0.0–1.0)
pH: 6.5 (ref 5.0–8.0)

## 2022-01-29 LAB — LIPID PANEL
Cholesterol: 136 mg/dL (ref 0–200)
HDL: 42.5 mg/dL (ref >39.00)
LDL Cholesterol: 58 mg/dL (ref 0–99)
NonHDL: 93.55
Total CHOL/HDL Ratio: 3
Triglycerides: 178 mg/dL — ABNORMAL HIGH (ref 0.0–149.0)
VLDL: 35.6 mg/dL (ref 0.0–40.0)

## 2022-01-29 LAB — HEMOGLOBIN A1C: Hgb A1c MFr Bld: 5.8 % (ref 4.6–6.5)

## 2022-01-30 ENCOUNTER — Other Ambulatory Visit: Payer: Self-pay | Admitting: Internal Medicine

## 2022-01-30 NOTE — Telephone Encounter (Signed)
Please refill as per office routine med refill policy (all routine meds to be refilled for 3 mo or monthly (per pt preference) up to one year from last visit, then month to month grace period for 3 mo, then further med refills will have to be denied) ? ?

## 2022-02-04 ENCOUNTER — Ambulatory Visit (INDEPENDENT_AMBULATORY_CARE_PROVIDER_SITE_OTHER): Payer: PPO | Admitting: Internal Medicine

## 2022-02-04 ENCOUNTER — Inpatient Hospital Stay: Admission: RE | Admit: 2022-02-04 | Payer: PPO | Source: Ambulatory Visit

## 2022-02-04 VITALS — BP 120/72 | HR 55 | Temp 98.5°F | Ht 66.0 in | Wt 170.0 lb

## 2022-02-04 DIAGNOSIS — M8588 Other specified disorders of bone density and structure, other site: Secondary | ICD-10-CM | POA: Diagnosis not present

## 2022-02-04 DIAGNOSIS — E538 Deficiency of other specified B group vitamins: Secondary | ICD-10-CM | POA: Diagnosis not present

## 2022-02-04 DIAGNOSIS — E559 Vitamin D deficiency, unspecified: Secondary | ICD-10-CM

## 2022-02-04 DIAGNOSIS — I10 Essential (primary) hypertension: Secondary | ICD-10-CM | POA: Diagnosis not present

## 2022-02-04 DIAGNOSIS — E039 Hypothyroidism, unspecified: Secondary | ICD-10-CM | POA: Diagnosis not present

## 2022-02-04 DIAGNOSIS — R739 Hyperglycemia, unspecified: Secondary | ICD-10-CM | POA: Diagnosis not present

## 2022-02-04 DIAGNOSIS — Z0000 Encounter for general adult medical examination without abnormal findings: Secondary | ICD-10-CM | POA: Diagnosis not present

## 2022-02-04 NOTE — Assessment & Plan Note (Signed)
BP Readings from Last 3 Encounters:  02/04/22 120/72  01/19/22 124/68  08/21/21 122/80   Stable, pt to continue medical treatment  - wt control, low salt

## 2022-02-04 NOTE — Patient Instructions (Signed)
Please schedule the bone density test before leaving today at the scheduling desk (where you check out)  Please continue all other medications as before, and refills have been done if requested.  Please have the pharmacy call with any other refills you may need.  Please continue your efforts at being more active, low cholesterol diet, and weight control.  You are otherwise up to date with prevention measures today.  Please keep your appointments with your specialists as you may have planned  Please make an Appointment to return in 6 months, or sooner if needed

## 2022-02-04 NOTE — Assessment & Plan Note (Signed)
Lab Results  Component Value Date   TSH 0.76 01/29/2022   Stable, pt to continue levothyroxine

## 2022-02-04 NOTE — Progress Notes (Signed)
Patient ID: Mary Ward, female   DOB: [birth year], 76 y.o.   MRN: [id number]         Chief Complaint:: wellness exam        HPI:  Mary Ward is a 76 y.o. female here for wellness exam; due for f/u dxa, o/w up to date                        Also abd pain per last visit resolved.  Admits to less than perfect diet, plans to do better, Has gained several lbs with some fluid retention.  Pt denies chest pain, increased sob or doe, wheezing, orthopnea, PND, increased LE swelling, palpitations, dizziness or syncope.   Pt denies polydipsia, polyuria, or new focal neuro s/s.    Pt denies fever, wt loss, night sweats, loss of appetite, or other constitutional symptoms  Wt Readings from Last 3 Encounters:  02/04/22 170 lb (77.1 kg)  01/19/22 169 lb (76.7 kg)  08/21/21 166 lb 6.4 oz (75.5 kg)   BP Readings from Last 3 Encounters:  02/04/22 120/72  01/19/22 124/68  08/21/21 122/80   Immunization History  Administered Date(s) Administered   Fluad Quad(high Dose 65+) 06/23/2019, 07/02/2020, 06/26/2021   Influenza Whole 07/30/1999, 07/14/2006, 07/03/2007, 06/13/2008, 06/13/2009   Influenza, High Dose Seasonal PF 06/23/2018   Influenza, Seasonal, Injecte, Preservative Fre 06/13/2013   Influenza-Unspecified 06/20/2014, 06/19/2019   PFIZER(Purple Top)SARS-COV-2 Vaccination 10/18/2019, 11/12/2019, 06/09/2020   Pfizer Covid-19 Vaccine Bivalent Booster 31yr & up 06/04/2021   Pneumococcal Conjugate-13 11/16/2013   Pneumococcal Polysaccharide-23 08/07/2007, 11/15/2012   Pneumococcal-Unspecified 03/02/2018   Td 09/13/1994   Varicella 02/12/1952   Zoster Recombinat (Shingrix) 03/21/2019, 05/22/2019   Zoster, Live 01/01/2008, 05/29/2019  There are no preventive care reminders to display for this patient.    Past Medical History:  Diagnosis Date   Allergy    dust, dogs, grass   EN (erythema nodosum)    Hyperlipidemia    Hyperplastic colonic polyp    Hypertension    Hypothyroidism    Obesity     Past Surgical History:  Procedure Laterality Date   ABDOMINAL HYSTERECTOMY     BREAST REDUCTION SURGERY  2002   BUNIONECTOMY     both feet   COLONOSCOPY  01/22/2014   CYST EXCISION  12/15/12   punch biopsy left shoulder blade   HAMMER TOE SURGERY     right 2nd toe   LAPAROSCOPIC ASSISTED VAGINAL HYSTERECTOMY  1992   with BSO   OOPHORECTOMY     TONSILLECTOMY      reports that she has never smoked. She has never used smokeless tobacco. She reports that she does not currently use alcohol. She reports that she does not use drugs. family history includes Cancer in her father; Diabetes in her sister; Heart attack in her mother; Heart disease in her sister; Hyperlipidemia in her mother; Hypertension in her mother and sister; Stroke in her mother. Allergies  Allergen Reactions   Indomethacin     Took with ASA and caused severe bloating   Prednisone     Developed acne   Prevnar 13 [Pneumococcal 13-Val Conj Vacc]     Arm became very sore and pt developed "fiery red knot" at site   Tetanus Toxoid     Entire arm became swollen and turned red   Tetracycline     "hot, red knots" below knees   Current Outpatient Medications on File Prior to Visit  Medication Sig Dispense  Refill   aspirin 81 MG tablet Take 81 mg by mouth daily.     atorvastatin (LIPITOR) 20 MG tablet TAKE ONE TABLET BY MOUTH DAILY 90 tablet 3   polyethylene glycol (MIRALAX / GLYCOLAX) 17 g packet Take 17 g by mouth daily.     SYNTHROID 125 MCG tablet TAKE ONE TABLET BY MOUTH DAILY 90 tablet 3   telmisartan-hydrochlorothiazide (MICARDIS HCT) 40-12.5 MG tablet TAKE ONE TABLET BY MOUTH DAILY 90 tablet 3   No current facility-administered medications on file prior to visit.        ROS:  All others reviewed and negative.  Objective        PE:  BP 120/72 (BP Location: Right Arm, Patient Position: Sitting, Cuff Size: Large)   Pulse (!) 55   Temp 98.5 F (36.9 C) (Oral)   Ht '5\' 6"'$  (1.676 m)   Wt 170 lb (77.1 kg)   LMP  09/13/1994   SpO2 95%   BMI 27.44 kg/m                 Constitutional: Pt appears in NAD               HENT: Head: NCAT.                Right Ear: External ear normal.                 Left Ear: External ear normal.                Eyes: . Pupils are equal, round, and reactive to light. Conjunctivae and EOM are normal               Nose: without d/c or deformity               Neck: Neck supple. Gross normal ROM               Cardiovascular: Normal rate and regular rhythm.                 Pulmonary/Chest: Effort normal and breath sounds without rales or wheezing.                Abd:  Soft, NT, ND, + BS, no organomegaly               Neurological: Pt is alert. At baseline orientation, motor grossly intact               Skin: Skin is warm. No rashes, no other new lesions, LE edema - none               Psychiatric: Pt behavior is normal without agitation   Micro: none  Cardiac tracings I have personally interpreted today:  none  Pertinent Radiological findings (summarize): none   Lab Results  Component Value Date   WBC 7.0 01/29/2022   HGB 14.2 01/29/2022   HCT 42.4 01/29/2022   PLT 137.0 (L) 01/29/2022   GLUCOSE 96 01/29/2022   CHOL 136 01/29/2022   TRIG 178.0 (H) 01/29/2022   HDL 42.50 01/29/2022   LDLCALC 58 01/29/2022   ALT 16 01/29/2022   AST 14 01/29/2022   NA 139 01/29/2022   K 4.3 01/29/2022   CL 105 01/29/2022   CREATININE 0.77 01/29/2022   BUN 19 01/29/2022   CO2 29 01/29/2022   TSH 0.76 01/29/2022   HGBA1C 5.8 01/29/2022   Assessment/Plan:  Mary Ward is a 76 y.o. White or Caucasian [  1] female with  has a past medical history of Allergy, EN (erythema nodosum), Hyperlipidemia, Hyperplastic colonic polyp, Hypertension, Hypothyroidism, and Obesity.  Preventative health care Age and sex appropriate education and counseling updated with regular exercise and diet Referrals for preventative services - for DXA Immunizations addressed - none needed Smoking  counseling  - none needed Evidence for depression or other mood disorder - none significant Most recent labs reviewed. I have personally reviewed and have noted: 1) the patient's medical and social history 2) The patient's current medications and supplements 3) The patient's height, weight, and BMI have been recorded in the chart   Hyperglycemia Lab Results  Component Value Date   HGBA1C 5.8 01/29/2022   Stable, pt to continue current medical treatment  - diet   Essential hypertension BP Readings from Last 3 Encounters:  02/04/22 120/72  01/19/22 124/68  08/21/21 122/80   Stable, pt to continue medical treatment  - wt control, low salt   Hypothyroidism Lab Results  Component Value Date   TSH 0.76 01/29/2022   Stable, pt to continue levothyroxine  Followup: No follow-ups on file.  Cathlean Cower, MD 02/04/2022 9:06 AM [facility]

## 2022-02-04 NOTE — Assessment & Plan Note (Signed)
Age and sex appropriate education and counseling updated with regular exercise and diet Referrals for preventative services - for DXA Immunizations addressed - none needed Smoking counseling  - none needed Evidence for depression or other mood disorder - none significant Most recent labs reviewed. I have personally reviewed and have noted: 1) the patient's medical and social history 2) The patient's current medications and supplements 3) The patient's height, weight, and BMI have been recorded in the chart

## 2022-02-04 NOTE — Assessment & Plan Note (Signed)
Lab Results  Component Value Date   HGBA1C 5.8 01/29/2022   Stable, pt to continue current medical treatment  - diet

## 2022-02-05 ENCOUNTER — Encounter: Payer: Self-pay | Admitting: Internal Medicine

## 2022-02-05 ENCOUNTER — Ambulatory Visit (INDEPENDENT_AMBULATORY_CARE_PROVIDER_SITE_OTHER)
Admission: RE | Admit: 2022-02-05 | Discharge: 2022-02-05 | Disposition: A | Discharge status: Home / Self Care | Payer: PPO | Source: Ambulatory Visit | Attending: Internal Medicine | Admitting: Internal Medicine

## 2022-02-05 DIAGNOSIS — M8588 Other specified disorders of bone density and structure, other site: Secondary | ICD-10-CM

## 2022-02-05 DIAGNOSIS — E538 Deficiency of other specified B group vitamins: Secondary | ICD-10-CM

## 2022-02-05 DIAGNOSIS — E559 Vitamin D deficiency, unspecified: Secondary | ICD-10-CM

## 2022-02-05 DIAGNOSIS — R739 Hyperglycemia, unspecified: Secondary | ICD-10-CM

## 2022-02-05 DIAGNOSIS — I10 Essential (primary) hypertension: Secondary | ICD-10-CM

## 2022-02-20 ENCOUNTER — Encounter: Payer: Self-pay | Admitting: Internal Medicine

## 2022-03-01 DIAGNOSIS — Z01419 Encounter for gynecological examination (general) (routine) without abnormal findings: Secondary | ICD-10-CM | POA: Diagnosis not present

## 2022-03-01 DIAGNOSIS — Z1231 Encounter for screening mammogram for malignant neoplasm of breast: Secondary | ICD-10-CM | POA: Diagnosis not present

## 2022-03-01 DIAGNOSIS — Z6829 Body mass index (BMI) 29.0-29.9, adult: Secondary | ICD-10-CM | POA: Diagnosis not present

## 2022-03-26 ENCOUNTER — Other Ambulatory Visit: Payer: Self-pay | Admitting: *Deleted

## 2022-03-26 DIAGNOSIS — I77819 Aortic ectasia, unspecified site: Secondary | ICD-10-CM

## 2022-03-26 DIAGNOSIS — Z01812 Encounter for preprocedural laboratory examination: Secondary | ICD-10-CM

## 2022-04-14 ENCOUNTER — Ambulatory Visit: Payer: PPO | Admitting: Internal Medicine

## 2022-04-19 DIAGNOSIS — M5416 Radiculopathy, lumbar region: Secondary | ICD-10-CM | POA: Diagnosis not present

## 2022-04-19 DIAGNOSIS — M25562 Pain in left knee: Secondary | ICD-10-CM | POA: Diagnosis not present

## 2022-04-19 DIAGNOSIS — M9903 Segmental and somatic dysfunction of lumbar region: Secondary | ICD-10-CM | POA: Diagnosis not present

## 2022-04-19 DIAGNOSIS — M9901 Segmental and somatic dysfunction of cervical region: Secondary | ICD-10-CM | POA: Diagnosis not present

## 2022-04-21 DIAGNOSIS — H52223 Regular astigmatism, bilateral: Secondary | ICD-10-CM | POA: Diagnosis not present

## 2022-04-21 DIAGNOSIS — H524 Presbyopia: Secondary | ICD-10-CM | POA: Diagnosis not present

## 2022-04-21 DIAGNOSIS — T1581XA Foreign body in other and multiple parts of external eye, right eye, initial encounter: Secondary | ICD-10-CM | POA: Diagnosis not present

## 2022-04-21 DIAGNOSIS — H5203 Hypermetropia, bilateral: Secondary | ICD-10-CM | POA: Diagnosis not present

## 2022-04-22 ENCOUNTER — Encounter: Payer: Self-pay | Admitting: Internal Medicine

## 2022-04-22 ENCOUNTER — Ambulatory Visit (INDEPENDENT_AMBULATORY_CARE_PROVIDER_SITE_OTHER): Payer: PPO | Admitting: Internal Medicine

## 2022-04-22 VITALS — BP 134/72 | HR 57 | Temp 97.6°F | Ht 66.0 in | Wt 171.2 lb

## 2022-04-22 DIAGNOSIS — I10 Essential (primary) hypertension: Secondary | ICD-10-CM

## 2022-04-22 DIAGNOSIS — R739 Hyperglycemia, unspecified: Secondary | ICD-10-CM | POA: Diagnosis not present

## 2022-04-22 DIAGNOSIS — M8580 Other specified disorders of bone density and structure, unspecified site: Secondary | ICD-10-CM

## 2022-04-22 DIAGNOSIS — M9903 Segmental and somatic dysfunction of lumbar region: Secondary | ICD-10-CM | POA: Diagnosis not present

## 2022-04-22 DIAGNOSIS — M5416 Radiculopathy, lumbar region: Secondary | ICD-10-CM | POA: Diagnosis not present

## 2022-04-22 DIAGNOSIS — M9901 Segmental and somatic dysfunction of cervical region: Secondary | ICD-10-CM | POA: Diagnosis not present

## 2022-04-22 DIAGNOSIS — M25562 Pain in left knee: Secondary | ICD-10-CM | POA: Diagnosis not present

## 2022-04-22 DIAGNOSIS — F418 Other specified anxiety disorders: Secondary | ICD-10-CM

## 2022-04-22 MED ORDER — MELOXICAM 15 MG PO TABS
15.0000 mg | ORAL_TABLET | Freq: Every day | ORAL | 1 refills | Status: DC | PRN
Start: 2022-04-22 — End: 2023-05-18

## 2022-04-22 NOTE — Patient Instructions (Signed)
You will be contacted regarding the referral for: MRI for the lumbar spine  Ok to take the [place] Plus D at least twice per day with meals  Please continue all other medications as before, and refills have been done if requested.  Please have the pharmacy call with any other refills you may need.  Please keep your appointments with your specialists as you may have planned

## 2022-04-22 NOTE — Progress Notes (Signed)
Patient ID: Mary Ward, female   DOB: 1946-07-01, 76 y.o.   MRN: [id number]       Chief Complaint: follow up left lower back pain with leg pain and weakness       HPI:  Mary Ward is a 76 y.o. female here with c/o > 6 wks onset left lwoer back pain now mod to severe, with radiation to the LLE, constant, associated with numbness, pain and now mild weakness to the distal LLE,   Pt denies chest pain, increased sob or doe, wheezing, orthopnea, PND, increased LE swelling, palpitations, dizziness or syncope.   Pt denies polydipsia, polyuria, or new focal neuro s/s.   Also wondering if needs to take calcium for osetopenia by DXA.         Wt Readings from Last 3 Encounters:  04/22/22 171 lb 3.2 oz (77.7 kg)  02/04/22 170 lb (77.1 kg)  01/19/22 169 lb (76.7 kg)   BP Readings from Last 3 Encounters:  04/22/22 134/72  02/04/22 120/72  01/19/22 124/68         Past Medical History:  Diagnosis Date   Allergy    dust, dogs, grass   EN (erythema nodosum)    Hyperlipidemia    Hyperplastic colonic polyp    Hypertension    Hypothyroidism    Obesity    Past Surgical History:  Procedure Laterality Date   ABDOMINAL HYSTERECTOMY     BREAST REDUCTION SURGERY  2002   BUNIONECTOMY     both feet   COLONOSCOPY  01/22/2014   CYST EXCISION  12/15/12   punch biopsy left shoulder blade   HAMMER TOE SURGERY     right 2nd toe   [place]   with BSO   OOPHORECTOMY     TONSILLECTOMY      reports that she has never smoked. She has never used smokeless tobacco. She reports that she does not currently use alcohol. She reports that she does not use drugs. family history includes Cancer in her father; Diabetes in her sister; Heart attack in her mother; Heart disease in her sister; Hyperlipidemia in her mother; Hypertension in her mother and sister; Stroke in her mother. Allergies  Allergen Reactions   Indomethacin     Took with ASA and caused severe bloating    Prednisone     Developed acne   Prevnar 13 [Pneumococcal 13-Val Conj Vacc]     Arm became very sore and pt developed "fiery red knot" at site   Tetanus Toxoid     Entire arm became swollen and turned red   Tetracycline     "hot, red knots" below knees   Current Outpatient Medications on File Prior to Visit  Medication Sig Dispense Refill   aspirin 81 MG tablet Take 81 mg by mouth daily.     atorvastatin (LIPITOR) 20 MG tablet TAKE ONE TABLET BY MOUTH DAILY 90 tablet 3   polyethylene glycol (MIRALAX / GLYCOLAX) 17 g packet Take 17 g by mouth daily.     SYNTHROID 125 MCG tablet TAKE ONE TABLET BY MOUTH DAILY 90 tablet 3   telmisartan-hydrochlorothiazide (MICARDIS HCT) 40-12.5 MG tablet TAKE ONE TABLET BY MOUTH DAILY 90 tablet 3   No current facility-administered medications on file prior to visit.        ROS:  All others reviewed and negative.  Objective        PE:  BP 134/72 (BP Location: Right Arm, Patient Position: Sitting, Cuff  Size: Normal)   Pulse (!) 57   Temp 97.6 F (36.4 C) (Oral)   Ht '5\' 6"'$  (1.676 m)   Wt 171 lb 3.2 oz (77.7 kg)   LMP 09/13/1994   SpO2 97%   BMI 27.63 kg/m                 Constitutional: Pt appears in NAD               HENT: Head: NCAT.                Right Ear: External ear normal.                 Left Ear: External ear normal.                Eyes: . Pupils are equal, round, and reactive to light. Conjunctivae and EOM are normal               Nose: without d/c or deformity               Neck: Neck supple. Gross normal ROM               Cardiovascular: Normal rate and regular rhythm.                 Pulmonary/Chest: Effort normal and breath sounds without rales or wheezing.                Abd:  Soft, NT, ND, + BS, no organomegaly               Neurological: Pt is alert. At baseline orientation, motor with 45 LLE weakness and decreased sensation to LT               Skin: Skin is warm. No rashes, no other new lesions, LE edema - none                Psychiatric: Pt behavior is normal without agitation   Micro: none  Cardiac tracings I have personally interpreted today:  none  Pertinent Radiological findings (summarize): none   Lab Results  Component Value Date   WBC 7.0 01/29/2022   HGB 14.2 01/29/2022   HCT 42.4 01/29/2022   PLT 137.0 (L) 01/29/2022   GLUCOSE 96 01/29/2022   CHOL 136 01/29/2022   TRIG 178.0 (H) 01/29/2022   HDL 42.50 01/29/2022   LDLCALC 58 01/29/2022   ALT 16 01/29/2022   AST 14 01/29/2022   NA 139 01/29/2022   K 4.3 01/29/2022   CL 105 01/29/2022   CREATININE 0.77 01/29/2022   BUN 19 01/29/2022   CO2 29 01/29/2022   TSH 0.76 01/29/2022   HGBA1C 5.8 01/29/2022   Assessment/Plan:  Mary Ward is a 76 y.o. White or Caucasian [1] female with  has a past medical history of Allergy, EN (erythema nodosum), Hyperlipidemia, Hyperplastic colonic polyp, Hypertension, Hypothyroidism, and Obesity.  Left lumbar radiculopathy Recent onset, moderated persistent with neuro change, for MRI, mobic 15 mg prn,  to f/u any worsening symptoms or concerns  Hyperglycemia Lab Results  Component Value Date   HGBA1C 5.8 01/29/2022   Stable, pt to continue current medical treatment  - diet, wt control, excercise   Essential hypertension BP Readings from Last 3 Encounters:  04/22/22 134/72  02/04/22 120/72  01/19/22 124/68   Stable, pt to continue medical treatment micardis hct 40 - 12.5 mg qd   Osteopenia Pt to add oxcal plus D  1 po tid with meals  Anxiety with depression Stable overall,  to f/u any worsening symptoms or concerns  Followup: Return if symptoms worsen or fail to improve.  Cathlean Cower, MD 04/24/2022 8:47 PM [facility]

## 2022-04-24 ENCOUNTER — Encounter: Payer: Self-pay | Admitting: Internal Medicine

## 2022-04-24 DIAGNOSIS — M5416 Radiculopathy, lumbar region: Secondary | ICD-10-CM | POA: Insufficient documentation

## 2022-04-24 DIAGNOSIS — M8580 Other specified disorders of bone density and structure, unspecified site: Secondary | ICD-10-CM | POA: Insufficient documentation

## 2022-04-24 NOTE — Assessment & Plan Note (Signed)
BP Readings from Last 3 Encounters:  04/22/22 134/72  02/04/22 120/72  01/19/22 124/68   Stable, pt to continue medical treatment micardis hct 40 - 12.5 mg qd

## 2022-04-24 NOTE — Assessment & Plan Note (Signed)
Lab Results  Component Value Date   HGBA1C 5.8 01/29/2022   Stable, pt to continue current medical treatment  - diet, wt control, excercise

## 2022-04-24 NOTE — Assessment & Plan Note (Signed)
Recent onset, moderated persistent with neuro change, for MRI, mobic 15 mg prn,  to f/u any worsening symptoms or concerns

## 2022-04-24 NOTE — Assessment & Plan Note (Signed)
Pt to add oxcal plus D 1 po tid with meals

## 2022-04-24 NOTE — Assessment & Plan Note (Signed)
Stable overall,  to f/u any worsening symptoms or concerns

## 2022-04-26 ENCOUNTER — Ambulatory Visit
Admission: RE | Admit: 2022-04-26 | Discharge: 2022-04-26 | Disposition: A | Discharge status: Home / Self Care | Payer: PPO | Source: Ambulatory Visit | Attending: Internal Medicine | Admitting: Internal Medicine

## 2022-04-26 DIAGNOSIS — M9901 Segmental and somatic dysfunction of cervical region: Secondary | ICD-10-CM | POA: Diagnosis not present

## 2022-04-26 DIAGNOSIS — M545 Low back pain, unspecified: Secondary | ICD-10-CM | POA: Diagnosis not present

## 2022-04-26 DIAGNOSIS — M5416 Radiculopathy, lumbar region: Secondary | ICD-10-CM

## 2022-04-26 DIAGNOSIS — M25562 Pain in left knee: Secondary | ICD-10-CM | POA: Diagnosis not present

## 2022-04-26 DIAGNOSIS — M48061 Spinal stenosis, lumbar region without neurogenic claudication: Secondary | ICD-10-CM | POA: Diagnosis not present

## 2022-04-26 DIAGNOSIS — M9903 Segmental and somatic dysfunction of lumbar region: Secondary | ICD-10-CM | POA: Diagnosis not present

## 2022-04-27 ENCOUNTER — Other Ambulatory Visit: Payer: Self-pay | Admitting: Internal Medicine

## 2022-04-27 ENCOUNTER — Ambulatory Visit
Admission: RE | Admit: 2022-04-27 | Discharge: 2022-04-27 | Disposition: A | Discharge status: Home / Self Care | Payer: PPO | Source: Ambulatory Visit | Attending: Cardiology | Admitting: Cardiology

## 2022-04-27 DIAGNOSIS — M5416 Radiculopathy, lumbar region: Secondary | ICD-10-CM

## 2022-04-27 DIAGNOSIS — I712 Thoracic aortic aneurysm, without rupture, unspecified: Secondary | ICD-10-CM | POA: Diagnosis not present

## 2022-04-27 DIAGNOSIS — R911 Solitary pulmonary nodule: Secondary | ICD-10-CM | POA: Diagnosis not present

## 2022-04-27 DIAGNOSIS — I77819 Aortic ectasia, unspecified site: Secondary | ICD-10-CM

## 2022-04-27 DIAGNOSIS — I700 Atherosclerosis of aorta: Secondary | ICD-10-CM | POA: Diagnosis not present

## 2022-04-27 DIAGNOSIS — N2889 Other specified disorders of kidney and ureter: Secondary | ICD-10-CM | POA: Diagnosis not present

## 2022-04-27 MED ORDER — IOPAMIDOL (ISOVUE-370) INJECTION 76%
75.0000 mL | Freq: Once | INTRAVENOUS | Status: AC | PRN
Start: 2022-04-27 — End: 2022-04-27
  Administered 2022-04-27: 75 mL via INTRAVENOUS

## 2022-04-28 ENCOUNTER — Encounter: Payer: Self-pay | Admitting: Internal Medicine

## 2022-04-28 ENCOUNTER — Other Ambulatory Visit: Payer: Self-pay | Admitting: *Deleted

## 2022-04-28 ENCOUNTER — Telehealth: Payer: Self-pay

## 2022-04-28 DIAGNOSIS — R911 Solitary pulmonary nodule: Secondary | ICD-10-CM

## 2022-04-28 NOTE — Telephone Encounter (Signed)
Called pt to further explain about referral being sent in already and to expect a call from them and once they do she can tell them the provider she prefers

## 2022-04-29 DIAGNOSIS — M9901 Segmental and somatic dysfunction of cervical region: Secondary | ICD-10-CM | POA: Diagnosis not present

## 2022-04-29 DIAGNOSIS — M9903 Segmental and somatic dysfunction of lumbar region: Secondary | ICD-10-CM | POA: Diagnosis not present

## 2022-04-29 DIAGNOSIS — M5416 Radiculopathy, lumbar region: Secondary | ICD-10-CM | POA: Diagnosis not present

## 2022-04-29 DIAGNOSIS — M25562 Pain in left knee: Secondary | ICD-10-CM | POA: Diagnosis not present

## 2022-04-30 ENCOUNTER — Encounter: Payer: Self-pay | Admitting: Internal Medicine

## 2022-05-04 DIAGNOSIS — M25562 Pain in left knee: Secondary | ICD-10-CM | POA: Diagnosis not present

## 2022-05-04 DIAGNOSIS — M9903 Segmental and somatic dysfunction of lumbar region: Secondary | ICD-10-CM | POA: Diagnosis not present

## 2022-05-04 DIAGNOSIS — M9901 Segmental and somatic dysfunction of cervical region: Secondary | ICD-10-CM | POA: Diagnosis not present

## 2022-05-04 DIAGNOSIS — M5416 Radiculopathy, lumbar region: Secondary | ICD-10-CM | POA: Diagnosis not present

## 2022-05-06 ENCOUNTER — Encounter: Payer: Self-pay | Admitting: Cardiology

## 2022-05-06 ENCOUNTER — Ambulatory Visit: Payer: PPO | Admitting: Cardiology

## 2022-05-06 VITALS — BP 152/80 | HR 59 | Ht 66.0 in | Wt 173.4 lb

## 2022-05-06 DIAGNOSIS — R911 Solitary pulmonary nodule: Secondary | ICD-10-CM | POA: Diagnosis not present

## 2022-05-06 DIAGNOSIS — I10 Essential (primary) hypertension: Secondary | ICD-10-CM | POA: Diagnosis not present

## 2022-05-06 DIAGNOSIS — R9431 Abnormal electrocardiogram [ECG] [EKG]: Secondary | ICD-10-CM | POA: Diagnosis not present

## 2022-05-06 DIAGNOSIS — I77819 Aortic ectasia, unspecified site: Secondary | ICD-10-CM

## 2022-05-06 DIAGNOSIS — I2510 Atherosclerotic heart disease of native coronary artery without angina pectoris: Secondary | ICD-10-CM

## 2022-05-06 DIAGNOSIS — E785 Hyperlipidemia, unspecified: Secondary | ICD-10-CM

## 2022-05-06 DIAGNOSIS — M9901 Segmental and somatic dysfunction of cervical region: Secondary | ICD-10-CM | POA: Diagnosis not present

## 2022-05-06 DIAGNOSIS — M9903 Segmental and somatic dysfunction of lumbar region: Secondary | ICD-10-CM | POA: Diagnosis not present

## 2022-05-06 DIAGNOSIS — M5416 Radiculopathy, lumbar region: Secondary | ICD-10-CM | POA: Diagnosis not present

## 2022-05-06 DIAGNOSIS — M25562 Pain in left knee: Secondary | ICD-10-CM | POA: Diagnosis not present

## 2022-05-06 NOTE — Progress Notes (Signed)
Cardiology Office Note:    Date:  05/06/2022   ID:  Mary Ward, DOB [birth year], MRN [id number]  PCP:  Biagio Borg, MD  Cardiologist:  Donato Heinz, MD  Electrophysiologist:  None   Referring MD: Biagio Borg, MD   Chief Complaint  Patient presents with   Coronary Artery Disease    History of Present Illness:    Mary Ward is a 76 y.o. female with a hx of hypertension, hypothyroidism, hyperlipidemia, erythema nodosum who presents for follow-up.  She was referred by Dr. Jenny Ward for evaluation of coronary calcifications, initially seen on 05/05/2021.  She denies any chest pain or dyspnea.  Reports she walks 2.5 miles 4-5 times a week and also does aerobic workouts.  No exertional symptoms.  She denies any lightheadedness, syncope, lower extremity edema.  Reports has very rare episodes of palpitations lasting few seconds but may be years between episodes.  Reports blood pressure has been well controlled.  She lost 55 pounds since last year with weight watchers.  No smoking history.  Family history includes mother had MI in 54s and sister died of MI at early 56s.  Calcium score on 02/17/2021 was 258 (80th percentile).  Also with borderline aortic dilatation measuring 38 mm.  Underwent Lexiscan Myoview in 11/2017 which showed normal perfusion, EF 68%.  Echocardiogram 05/08/2021 showed EF 55 to 60%, basal inferior hypokinesis, grade 1 diastolic dysfunction, mild RV dysfunction, mild MR, mild dilatation of ascending aorta measuring 39 mm.  Since last clinic visit, she reports has been doing well.  Having hip/knee pain, found to have lumbar degenerative disc disease and foraminal narrowing at L4-5, has been referred to neurosurgery.  Denies any chest pain, dyspnea, lightheadedness, syncope, lower extremity edema.  Walks 3 miles twice weekly.   Past Medical History:  Diagnosis Date   Allergy    dust, dogs, grass   EN (erythema nodosum)    Hyperlipidemia    Hyperplastic colonic polyp     Hypertension    Hypothyroidism    Obesity     Past Surgical History:  Procedure Laterality Date   ABDOMINAL HYSTERECTOMY     BREAST REDUCTION SURGERY  2002   BUNIONECTOMY     both feet   COLONOSCOPY  01/22/2014   CYST EXCISION  12/15/12   punch biopsy left shoulder blade   HAMMER TOE SURGERY     right 2nd toe   LAPAROSCOPIC ASSISTED VAGINAL HYSTERECTOMY  1992   with BSO   OOPHORECTOMY     TONSILLECTOMY      Current Medications: Current Meds  Medication Sig   aspirin 81 MG tablet Take 81 mg by mouth daily.   atorvastatin (LIPITOR) 20 MG tablet TAKE ONE TABLET BY MOUTH DAILY   meloxicam (MOBIC) 15 MG tablet Take 1 tablet (15 mg total) by mouth daily as needed for pain.   polyethylene glycol (MIRALAX / GLYCOLAX) 17 g packet Take 17 g by mouth daily.   SYNTHROID 125 MCG tablet TAKE ONE TABLET BY MOUTH DAILY   telmisartan-hydrochlorothiazide (MICARDIS HCT) 40-12.5 MG tablet TAKE ONE TABLET BY MOUTH DAILY     Allergies:   Indomethacin, Prednisone, Prevnar 13 [pneumococcal 13-val conj vacc], Tetanus toxoid, and Tetracycline   Social History   Socioeconomic History   Marital status: Married    Spouse name: Not on file   Number of children: 0   Years of education: Not on file   Highest education level: High school graduate  Occupational History  Occupation: Psychologist, occupational: [employer]  Tobacco Use   Smoking status: Never   Smokeless tobacco: Never  Vaping Use   Vaping Use: Never used  Substance and Sexual Activity   Alcohol use: Not Currently    Alcohol/week: 0.0 standard drinks of alcohol   Drug use: No   Sexual activity: Never    Birth control/protection: Post-menopausal, Surgical    Comment: Hysterectomy  Other Topics Concern   Not on file  Social History Narrative   Family history of high Cholesterol   Social Determinants of Health   Financial Resource Strain: Low Risk  (08/21/2021)   Overall Financial Resource Strain (CARDIA)     Difficulty of Paying Living Expenses: Not hard at all  Food Insecurity: No Food Insecurity (08/21/2021)   Hunger Vital Sign    Worried About Running Out of Food in the Last Year: Never true    Ran Out of Food in the Last Year: Never true  Transportation Needs: No Transportation Needs (08/21/2021)   PRAPARE - Hydrologist (Medical): No    Lack of Transportation (Non-Medical): No  Physical Activity: Sufficiently Active (08/21/2021)   Exercise Vital Sign    Days of Exercise per Week: 5 days    Minutes of Exercise per Session: 60 min  Stress: No Stress Concern Present (08/21/2021)   [place]    Feeling of Stress : Not at all  Social Connections: Bier (08/21/2021)   Social Connection and Isolation Panel [NHANES]    Frequency of Communication with Friends and Family: More than three times a week    Frequency of Social Gatherings with Friends and Family: Once a week    Attends Religious Services: More than 4 times per year    Active Member of [employer] or Organizations: Yes    Attends Music therapist: More than 4 times per year    Marital Status: Married     Family History: The patient's family history includes Cancer in her father; Diabetes in her sister; Heart attack in her mother; Heart disease in her sister; Hyperlipidemia in her mother; Hypertension in her mother and sister; Stroke in her mother. There is no history of Colon cancer, Esophageal cancer, Rectal cancer, Stomach cancer, or Colon polyps.  ROS:   Please see the history of present illness.     All other systems reviewed and are negative.  EKGs/Labs/Other Studies Reviewed:    The following studies were reviewed today:   EKG:   05/06/2022: Sinus bradycardia, rate 59, no ST abnormalities, poor R wave progression  Recent Labs: 01/29/2022: ALT 16; BUN 19; Creatinine, Ser 0.77; Hemoglobin 14.2; Platelets 137.0;  Potassium 4.3; Sodium 139; TSH 0.76  Recent Lipid Panel    Component Value Date/Time   CHOL 136 01/29/2022 0751   TRIG 178.0 (H) 01/29/2022 0751   TRIG 85 07/28/2006 0740   HDL 42.50 01/29/2022 0751   CHOLHDL 3 01/29/2022 0751   VLDL 35.6 01/29/2022 0751   LDLCALC 58 01/29/2022 0751    Physical Exam:    VS:  BP (!) 152/80   Pulse (!) 59   Ht '5\' 6"'$  (1.676 m)   Wt 173 lb 6.4 oz (78.7 kg)   LMP 09/13/1994   SpO2 98%   BMI 27.99 kg/m     Wt Readings from Last 3 Encounters:  05/06/22 173 lb 6.4 oz (78.7 kg)  04/22/22 171 lb 3.2 oz (  77.7 kg)  02/04/22 170 lb (77.1 kg)     GEN:  Well nourished, well developed in no acute distress HEENT: Normal NECK: No JVD; No carotid bruits CARDIAC: QMV,7/8 systolic murmur RESPIRATORY:  Clear to auscultation without rales, wheezing or rhonchi  ABDOMEN: Soft, non-tender, non-distended MUSCULOSKELETAL:  No edema; No deformity  SKIN: Warm and dry NEUROLOGIC:  Alert and oriented x 3 PSYCHIATRIC:  Normal affect   ASSESSMENT:    1. Coronary artery disease involving native coronary artery of native heart without angina pectoris   2. Abnormal EKG   3. Aortic dilatation (HCC)   4. Essential hypertension   5. Hyperlipidemia, unspecified hyperlipidemia type   6. Pulmonary nodule     PLAN:    CAD: Calcium score on 02/17/2021 was 258 (80th percentile).  Denies any anginal symptoms. -Continue atorvastatin 20 mg daily.  LDL 58 on 01/29/2022 -Continue aspirin 81 mg daily  Abnormal EKG: Poor R wave progression.  Echocardiogram 05/08/2021 showed EF 55 to 60%, basal inferior hypokinesis, grade 1 diastolic dysfunction, mild RV dysfunction, mild MR, mild dilatation of ascending aorta measuring 39 mm.  Aortic dilatation: Borderline ascending aortic dilatation noted on noncontrast chest CT for calcium score.  CTA chest 04/27/2022 showed stable ascending aorta measured 38 mm.  Hypertension: On telmisartan-hydrochlorothiazide 40-12.5 mg daily.  BP elevated  in clinic today, asked to check BP twice daily for next week and call with results  Hyperlipidemia: On atorvastatin 20 mg daily, LDL 58 on 01/29/2022  Pulmonary nodule: Noted on CTA chest 04/27/2022.  Plan repeat CT chest in 1 year to follow-up.  RTC in 1 year   Medication Adjustments/Labs and Tests Ordered: Current medicines are reviewed at length with the patient today.  Concerns regarding medicines are outlined above.  Orders Placed This Encounter  Procedures   EKG 12-Lead   No orders of the defined types were placed in this encounter.    Patient Instructions  Medication Instructions:  Your physician recommends that you continue on your current medications as directed. Please refer to the Current Medication list given to you today.  *If you need a refill on your cardiac medications before your next appointment, please call your pharmacy*  Follow-Up: At [facility], you and your health needs are our priority.  As part of our continuing mission to provide you with exceptional heart care, we have created designated Provider Care Teams.  These Care Teams include your primary Cardiologist (physician) and Advanced Practice Providers (APPs -  Physician Assistants and Nurse Practitioners) who all work together to provide you with the care you need, when you need it.  We recommend signing up for the patient portal called "MyChart".  Sign up information is provided on this After Visit Summary.  MyChart is used to connect with patients for Virtual Visits (Telemedicine).  Patients are able to view lab/test results, encounter notes, upcoming appointments, etc.  Non-urgent messages can be sent to your provider as well.   To learn more about what you can do with MyChart, go to [email or web address].    Your next appointment:   12 month(s)  The format for your next appointment:   In Person  Provider:   Donato Heinz, MD {   Other Instructions Please check your blood pressure  at home twice daily, write it down.  Call the office or send message via Mychart with the readings in 1 week for Dr. Gardiner Rhyme to review.            Signed, Harrell Gave  Fritz Pickerel, MD  05/06/2022 9:58 AM    [facility]

## 2022-05-06 NOTE — Patient Instructions (Signed)
Medication Instructions:  Your physician recommends that you continue on your current medications as directed. Please refer to the Current Medication list given to you today.  *If you need a refill on your cardiac medications before your next appointment, please call your pharmacy*  Follow-Up: At [facility], you and your health needs are our priority.  As part of our continuing mission to provide you with exceptional heart care, we have created designated Provider Care Teams.  These Care Teams include your primary Cardiologist (physician) and Advanced Practice Providers (APPs -  Physician Assistants and Nurse Practitioners) who all work together to provide you with the care you need, when you need it.  We recommend signing up for the patient portal called "MyChart".  Sign up information is provided on this After Visit Summary.  MyChart is used to connect with patients for Virtual Visits (Telemedicine).  Patients are able to view lab/test results, encounter notes, upcoming appointments, etc.  Non-urgent messages can be sent to your provider as well.   To learn more about what you can do with MyChart, go to [email or web address].    Your next appointment:   12 month(s)  The format for your next appointment:   In Person  Provider:   Donato Heinz, MD {   Other Instructions Please check your blood pressure at home twice daily, write it down.  Call the office or send message via Mychart with the readings in 1 week for Dr. Gardiner Rhyme to review.

## 2022-05-10 DIAGNOSIS — M9901 Segmental and somatic dysfunction of cervical region: Secondary | ICD-10-CM | POA: Diagnosis not present

## 2022-05-10 DIAGNOSIS — M5416 Radiculopathy, lumbar region: Secondary | ICD-10-CM | POA: Diagnosis not present

## 2022-05-10 DIAGNOSIS — N281 Cyst of kidney, acquired: Secondary | ICD-10-CM | POA: Diagnosis not present

## 2022-05-10 DIAGNOSIS — M25562 Pain in left knee: Secondary | ICD-10-CM | POA: Diagnosis not present

## 2022-05-10 DIAGNOSIS — M9903 Segmental and somatic dysfunction of lumbar region: Secondary | ICD-10-CM | POA: Diagnosis not present

## 2022-05-11 DIAGNOSIS — M5416 Radiculopathy, lumbar region: Secondary | ICD-10-CM | POA: Diagnosis not present

## 2022-05-11 DIAGNOSIS — Z6828 Body mass index (BMI) 28.0-28.9, adult: Secondary | ICD-10-CM | POA: Diagnosis not present

## 2022-05-13 DIAGNOSIS — M9901 Segmental and somatic dysfunction of cervical region: Secondary | ICD-10-CM | POA: Diagnosis not present

## 2022-05-13 DIAGNOSIS — M25562 Pain in left knee: Secondary | ICD-10-CM | POA: Diagnosis not present

## 2022-05-13 DIAGNOSIS — M9903 Segmental and somatic dysfunction of lumbar region: Secondary | ICD-10-CM | POA: Diagnosis not present

## 2022-05-13 DIAGNOSIS — M5416 Radiculopathy, lumbar region: Secondary | ICD-10-CM | POA: Diagnosis not present

## 2022-05-14 ENCOUNTER — Encounter: Payer: Self-pay | Admitting: Cardiology

## 2022-05-18 DIAGNOSIS — M9903 Segmental and somatic dysfunction of lumbar region: Secondary | ICD-10-CM | POA: Diagnosis not present

## 2022-05-18 DIAGNOSIS — M9901 Segmental and somatic dysfunction of cervical region: Secondary | ICD-10-CM | POA: Diagnosis not present

## 2022-05-18 DIAGNOSIS — M5416 Radiculopathy, lumbar region: Secondary | ICD-10-CM | POA: Diagnosis not present

## 2022-05-18 DIAGNOSIS — M25562 Pain in left knee: Secondary | ICD-10-CM | POA: Diagnosis not present

## 2022-05-20 DIAGNOSIS — M9903 Segmental and somatic dysfunction of lumbar region: Secondary | ICD-10-CM | POA: Diagnosis not present

## 2022-05-20 DIAGNOSIS — M9901 Segmental and somatic dysfunction of cervical region: Secondary | ICD-10-CM | POA: Diagnosis not present

## 2022-05-20 DIAGNOSIS — M25562 Pain in left knee: Secondary | ICD-10-CM | POA: Diagnosis not present

## 2022-05-20 DIAGNOSIS — M5416 Radiculopathy, lumbar region: Secondary | ICD-10-CM | POA: Diagnosis not present

## 2022-05-25 DIAGNOSIS — M9901 Segmental and somatic dysfunction of cervical region: Secondary | ICD-10-CM | POA: Diagnosis not present

## 2022-05-25 DIAGNOSIS — M9903 Segmental and somatic dysfunction of lumbar region: Secondary | ICD-10-CM | POA: Diagnosis not present

## 2022-05-25 DIAGNOSIS — M5416 Radiculopathy, lumbar region: Secondary | ICD-10-CM | POA: Diagnosis not present

## 2022-05-25 DIAGNOSIS — M25562 Pain in left knee: Secondary | ICD-10-CM | POA: Diagnosis not present

## 2022-05-27 DIAGNOSIS — M9903 Segmental and somatic dysfunction of lumbar region: Secondary | ICD-10-CM | POA: Diagnosis not present

## 2022-05-27 DIAGNOSIS — M5416 Radiculopathy, lumbar region: Secondary | ICD-10-CM | POA: Diagnosis not present

## 2022-05-27 DIAGNOSIS — M9901 Segmental and somatic dysfunction of cervical region: Secondary | ICD-10-CM | POA: Diagnosis not present

## 2022-05-27 DIAGNOSIS — M25562 Pain in left knee: Secondary | ICD-10-CM | POA: Diagnosis not present

## 2022-06-04 DIAGNOSIS — M9901 Segmental and somatic dysfunction of cervical region: Secondary | ICD-10-CM | POA: Diagnosis not present

## 2022-06-04 DIAGNOSIS — M25562 Pain in left knee: Secondary | ICD-10-CM | POA: Diagnosis not present

## 2022-06-04 DIAGNOSIS — M5416 Radiculopathy, lumbar region: Secondary | ICD-10-CM | POA: Diagnosis not present

## 2022-06-04 DIAGNOSIS — M9903 Segmental and somatic dysfunction of lumbar region: Secondary | ICD-10-CM | POA: Diagnosis not present

## 2022-06-10 DIAGNOSIS — M25562 Pain in left knee: Secondary | ICD-10-CM | POA: Diagnosis not present

## 2022-06-10 DIAGNOSIS — M9903 Segmental and somatic dysfunction of lumbar region: Secondary | ICD-10-CM | POA: Diagnosis not present

## 2022-06-10 DIAGNOSIS — M9901 Segmental and somatic dysfunction of cervical region: Secondary | ICD-10-CM | POA: Diagnosis not present

## 2022-06-10 DIAGNOSIS — M5416 Radiculopathy, lumbar region: Secondary | ICD-10-CM | POA: Diagnosis not present

## 2022-06-15 DIAGNOSIS — M9903 Segmental and somatic dysfunction of lumbar region: Secondary | ICD-10-CM | POA: Diagnosis not present

## 2022-06-15 DIAGNOSIS — M5416 Radiculopathy, lumbar region: Secondary | ICD-10-CM | POA: Diagnosis not present

## 2022-06-15 DIAGNOSIS — M25562 Pain in left knee: Secondary | ICD-10-CM | POA: Diagnosis not present

## 2022-06-15 DIAGNOSIS — M9901 Segmental and somatic dysfunction of cervical region: Secondary | ICD-10-CM | POA: Diagnosis not present

## 2022-06-17 DIAGNOSIS — M5416 Radiculopathy, lumbar region: Secondary | ICD-10-CM | POA: Diagnosis not present

## 2022-06-17 DIAGNOSIS — M9901 Segmental and somatic dysfunction of cervical region: Secondary | ICD-10-CM | POA: Diagnosis not present

## 2022-06-17 DIAGNOSIS — M9903 Segmental and somatic dysfunction of lumbar region: Secondary | ICD-10-CM | POA: Diagnosis not present

## 2022-06-17 DIAGNOSIS — M25562 Pain in left knee: Secondary | ICD-10-CM | POA: Diagnosis not present

## 2022-06-21 ENCOUNTER — Ambulatory Visit (INDEPENDENT_AMBULATORY_CARE_PROVIDER_SITE_OTHER): Payer: PPO | Admitting: *Deleted

## 2022-06-21 DIAGNOSIS — Z23 Encounter for immunization: Secondary | ICD-10-CM | POA: Diagnosis not present

## 2022-06-21 NOTE — Progress Notes (Signed)
Patient here for her high dose flu vaccine. Given in left deltoid. Patient tolerated well   Please co sign

## 2022-06-23 DIAGNOSIS — M21962 Unspecified acquired deformity of left lower leg: Secondary | ICD-10-CM | POA: Diagnosis not present

## 2022-06-23 DIAGNOSIS — S92534A Nondisplaced fracture of distal phalanx of right lesser toe(s), initial encounter for closed fracture: Secondary | ICD-10-CM | POA: Diagnosis not present

## 2022-06-23 DIAGNOSIS — M21961 Unspecified acquired deformity of right lower leg: Secondary | ICD-10-CM | POA: Diagnosis not present

## 2022-06-24 DIAGNOSIS — M5416 Radiculopathy, lumbar region: Secondary | ICD-10-CM | POA: Diagnosis not present

## 2022-06-24 DIAGNOSIS — M25562 Pain in left knee: Secondary | ICD-10-CM | POA: Diagnosis not present

## 2022-06-24 DIAGNOSIS — M9903 Segmental and somatic dysfunction of lumbar region: Secondary | ICD-10-CM | POA: Diagnosis not present

## 2022-06-24 DIAGNOSIS — M9901 Segmental and somatic dysfunction of cervical region: Secondary | ICD-10-CM | POA: Diagnosis not present

## 2022-06-28 DIAGNOSIS — M5416 Radiculopathy, lumbar region: Secondary | ICD-10-CM | POA: Diagnosis not present

## 2022-06-28 DIAGNOSIS — M9901 Segmental and somatic dysfunction of cervical region: Secondary | ICD-10-CM | POA: Diagnosis not present

## 2022-06-28 DIAGNOSIS — M9903 Segmental and somatic dysfunction of lumbar region: Secondary | ICD-10-CM | POA: Diagnosis not present

## 2022-06-28 DIAGNOSIS — M25562 Pain in left knee: Secondary | ICD-10-CM | POA: Diagnosis not present

## 2022-07-05 DIAGNOSIS — M9901 Segmental and somatic dysfunction of cervical region: Secondary | ICD-10-CM | POA: Diagnosis not present

## 2022-07-05 DIAGNOSIS — M5416 Radiculopathy, lumbar region: Secondary | ICD-10-CM | POA: Diagnosis not present

## 2022-07-05 DIAGNOSIS — M25562 Pain in left knee: Secondary | ICD-10-CM | POA: Diagnosis not present

## 2022-07-05 DIAGNOSIS — M9903 Segmental and somatic dysfunction of lumbar region: Secondary | ICD-10-CM | POA: Diagnosis not present

## 2022-07-12 DIAGNOSIS — M25562 Pain in left knee: Secondary | ICD-10-CM | POA: Diagnosis not present

## 2022-07-12 DIAGNOSIS — M9901 Segmental and somatic dysfunction of cervical region: Secondary | ICD-10-CM | POA: Diagnosis not present

## 2022-07-12 DIAGNOSIS — M5416 Radiculopathy, lumbar region: Secondary | ICD-10-CM | POA: Diagnosis not present

## 2022-07-12 DIAGNOSIS — M9903 Segmental and somatic dysfunction of lumbar region: Secondary | ICD-10-CM | POA: Diagnosis not present

## 2022-08-02 ENCOUNTER — Other Ambulatory Visit (INDEPENDENT_AMBULATORY_CARE_PROVIDER_SITE_OTHER): Payer: PPO

## 2022-08-02 DIAGNOSIS — R739 Hyperglycemia, unspecified: Secondary | ICD-10-CM

## 2022-08-02 DIAGNOSIS — E538 Deficiency of other specified B group vitamins: Secondary | ICD-10-CM | POA: Diagnosis not present

## 2022-08-02 DIAGNOSIS — I10 Essential (primary) hypertension: Secondary | ICD-10-CM | POA: Diagnosis not present

## 2022-08-02 DIAGNOSIS — E559 Vitamin D deficiency, unspecified: Secondary | ICD-10-CM | POA: Diagnosis not present

## 2022-08-02 LAB — HEPATIC FUNCTION PANEL
ALT: 13 U/L (ref 0–35)
AST: 15 U/L (ref 0–37)
Albumin: 3.9 g/dL (ref 3.5–5.2)
Alkaline Phosphatase: 76 U/L (ref 39–117)
Bilirubin, Direct: 0.1 mg/dL (ref 0.0–0.3)
Total Bilirubin: 0.5 mg/dL (ref 0.2–1.2)
Total Protein: 6.4 g/dL (ref 6.0–8.3)

## 2022-08-02 LAB — URINALYSIS, ROUTINE W REFLEX MICROSCOPIC
Bilirubin Urine: NEGATIVE
Hgb urine dipstick: NEGATIVE
Ketones, ur: NEGATIVE
Leukocytes,Ua: NEGATIVE
Nitrite: NEGATIVE
Specific Gravity, Urine: 1.015 (ref 1.000–1.030)
Total Protein, Urine: NEGATIVE
Urine Glucose: NEGATIVE
Urobilinogen, UA: 0.2 (ref 0.0–1.0)
pH: 6.5 (ref 5.0–8.0)

## 2022-08-02 LAB — CBC WITH DIFFERENTIAL/PLATELET
Basophils Absolute: 0 10*3/uL (ref 0.0–0.1)
Basophils Relative: 0.5 % (ref 0.0–3.0)
Eosinophils Absolute: 0.2 10*3/uL (ref 0.0–0.7)
Eosinophils Relative: 3.3 % (ref 0.0–5.0)
HCT: 42.1 % (ref 36.0–46.0)
Hemoglobin: 13.9 g/dL (ref 12.0–15.0)
Lymphocytes Relative: 27.7 % (ref 12.0–46.0)
Lymphs Abs: 2 10*3/uL (ref 0.7–4.0)
MCHC: 33 g/dL (ref 30.0–36.0)
MCV: 85.4 fl (ref 78.0–100.0)
Monocytes Absolute: 0.5 10*3/uL (ref 0.1–1.0)
Monocytes Relative: 7.5 % (ref 3.0–12.0)
Neutro Abs: 4.4 10*3/uL (ref 1.4–7.7)
Neutrophils Relative %: 61 % (ref 43.0–77.0)
Platelets: 149 10*3/uL — ABNORMAL LOW (ref 150.0–400.0)
RBC: 4.93 Mil/uL (ref 3.87–5.11)
RDW: 13 % (ref 11.5–15.5)
WBC: 7.2 10*3/uL (ref 4.0–10.5)

## 2022-08-02 LAB — LIPID PANEL
Cholesterol: 127 mg/dL (ref 0–200)
HDL: 46.1 mg/dL (ref >39.00)
LDL Cholesterol: 61 mg/dL (ref 0–99)
NonHDL: 81.05
Total CHOL/HDL Ratio: 3
Triglycerides: 102 mg/dL (ref 0.0–149.0)
VLDL: 20.4 mg/dL (ref 0.0–40.0)

## 2022-08-02 LAB — BASIC METABOLIC PANEL
BUN: 20 mg/dL (ref 6–23)
CO2: 29 mEq/L (ref 19–32)
Calcium: 9 mg/dL (ref 8.4–10.5)
Chloride: 104 mEq/L (ref 96–112)
Creatinine, Ser: 0.74 mg/dL (ref 0.40–1.20)
GFR: 79.37 mL/min (ref >60.00)
Glucose, Bld: 101 mg/dL — ABNORMAL HIGH (ref 70–99)
Potassium: 4.4 mEq/L (ref 3.5–5.1)
Sodium: 139 mEq/L (ref 135–145)

## 2022-08-02 LAB — HEMOGLOBIN A1C: Hgb A1c MFr Bld: 5.9 % (ref 4.6–6.5)

## 2022-08-02 LAB — VITAMIN B12: Vitamin B-12: 222 pg/mL (ref 211–911)

## 2022-08-02 LAB — VITAMIN D 25 HYDROXY (VIT D DEFICIENCY, FRACTURES): VITD: 59.48 ng/mL (ref 30.00–100.00)

## 2022-08-02 LAB — TSH: TSH: 0.61 u[IU]/mL (ref 0.35–5.50)

## 2022-08-09 ENCOUNTER — Encounter: Payer: Self-pay | Admitting: Internal Medicine

## 2022-08-09 ENCOUNTER — Ambulatory Visit (INDEPENDENT_AMBULATORY_CARE_PROVIDER_SITE_OTHER): Payer: PPO | Admitting: Internal Medicine

## 2022-08-09 VITALS — BP 124/72 | HR 59 | Temp 97.9°F | Ht 66.0 in | Wt 171.0 lb

## 2022-08-09 DIAGNOSIS — R739 Hyperglycemia, unspecified: Secondary | ICD-10-CM

## 2022-08-09 DIAGNOSIS — E039 Hypothyroidism, unspecified: Secondary | ICD-10-CM | POA: Diagnosis not present

## 2022-08-09 DIAGNOSIS — E785 Hyperlipidemia, unspecified: Secondary | ICD-10-CM

## 2022-08-09 DIAGNOSIS — M21969 Unspecified acquired deformity of unspecified lower leg: Secondary | ICD-10-CM | POA: Insufficient documentation

## 2022-08-09 DIAGNOSIS — M5416 Radiculopathy, lumbar region: Secondary | ICD-10-CM

## 2022-08-09 DIAGNOSIS — E559 Vitamin D deficiency, unspecified: Secondary | ICD-10-CM

## 2022-08-09 DIAGNOSIS — E538 Deficiency of other specified B group vitamins: Secondary | ICD-10-CM

## 2022-08-09 MED ORDER — TRAMADOL HCL 50 MG PO TABS: ORAL_TABLET | ORAL | 0 refills | Status: DC

## 2022-08-09 MED ORDER — PREDNISONE 10 MG PO TABS: ORAL_TABLET | ORAL | 0 refills | Status: DC

## 2022-08-09 NOTE — Assessment & Plan Note (Signed)
Lab Results  Component Value Date   HGBA1C 5.9 08/02/2022   Stable, pt to continue current medical treatment  - diet, wt control, excercsie

## 2022-08-09 NOTE — Patient Instructions (Addendum)
Please take OTC B12 1000 mcg per day, and a Multivitamin  - 1 per day   Please take all new medication as prescribed - the tramadol, and prednisone  You will be contacted regarding the referral for: Physical Therapy  Please continue all other medications as before, and refills have been done if requested.  You are otherwise up to date with prevention measures today.  Please keep your appointments with your specialists as you may have planned - chiropracter, and Dr Ronnald Ramp - [facility]  Please make an Appointment to return in 6 months, or sooner if needed, also with Lab Appointment for testing done 3-5 days before at the [facility] (so this is for TWO appointments - please see the scheduling desk as you leave)

## 2022-08-09 NOTE — Assessment & Plan Note (Signed)
Lab Results  Component Value Date   [id number] 734 08/02/2022   Low, to start oral replacement - b12 1000 mcg qd

## 2022-08-09 NOTE — Assessment & Plan Note (Signed)
Lab Results  Component Value Date   TSH 0.61 08/02/2022   Stable, pt to continue levothyroxine 125 mg  - DAW only

## 2022-08-09 NOTE — Progress Notes (Signed)
Patient ID: Mary Ward, female   DOB: [birth year], 76 y.o.   MRN: [id number]        Chief Complaint: follow up, HLD and hyperglycemia, low b12, low thyroid        HPI:  Mary Ward is a 76 y.o. female here overall doing ok.  Pt denies chest pain, increased sob or doe, wheezing, orthopnea, PND, increased LE swelling, palpitations, dizziness or syncope.   Pt denies polydipsia, polyuria, or new focal neuro s/s.   Not taking B12.  Pt continues to have recurring left LBP without change in severity, bowel or bladder change, fever, wt loss,  worsening LE pain/numbness/weakness, gait change or falls. Saw Dr Ronnald Ramp [facility], pain improvd initially but now with worsening pain   again.  Mostly can't sleep well, and fatigued due to pain that keeps recurring at night with lying down.  No overt bleeding or bruising.     Wt Readings from Last 3 Encounters:  08/09/22 171 lb (77.6 kg)  05/06/22 173 lb 6.4 oz (78.7 kg)  04/22/22 171 lb 3.2 oz (77.7 kg)   BP Readings from Last 3 Encounters:  08/09/22 124/72  05/06/22 (!) 152/80  04/22/22 134/72         Past Medical History:  Diagnosis Date   Allergy    dust, dogs, grass   EN (erythema nodosum)    Hyperlipidemia    Hyperplastic colonic polyp    Hypertension    Hypothyroidism    Obesity    Past Surgical History:  Procedure Laterality Date   ABDOMINAL HYSTERECTOMY     BREAST REDUCTION SURGERY  2002   BUNIONECTOMY     both feet   COLONOSCOPY  01/22/2014   CYST EXCISION  12/15/12   punch biopsy left shoulder blade   HAMMER TOE SURGERY     right 2nd toe   LAPAROSCOPIC ASSISTED VAGINAL HYSTERECTOMY  1992   with BSO   OOPHORECTOMY     TONSILLECTOMY      reports that she has never smoked. She has never used smokeless tobacco. She reports that she does not currently use alcohol. She reports that she does not use drugs. family history includes Cancer in her father; Diabetes in her sister; Heart attack in her mother; Heart disease in her sister; Hyperlipidemia  in her mother; Hypertension in her mother and sister; Stroke in her mother. Allergies  Allergen Reactions   Indomethacin     Took with ASA and caused severe bloating   Prednisone     Developed acne   Prevnar 13 [Pneumococcal 13-Val Conj Vacc]     Arm became very sore and pt developed "fiery red knot" at site   Tetanus Toxoid     Entire arm became swollen and turned red   Tetracycline     "hot, red knots" below knees   Current Outpatient Medications on File Prior to Visit  Medication Sig Dispense Refill   aspirin 81 MG tablet Take 81 mg by mouth daily.     atorvastatin (LIPITOR) 20 MG tablet TAKE ONE TABLET BY MOUTH DAILY 90 tablet 3   meloxicam (MOBIC) 15 MG tablet Take 1 tablet (15 mg total) by mouth daily as needed for pain. 90 tablet 1   polyethylene glycol (MIRALAX / GLYCOLAX) 17 g packet Take 17 g by mouth daily.     SYNTHROID 125 MCG tablet TAKE ONE TABLET BY MOUTH DAILY 90 tablet 3   telmisartan-hydrochlorothiazide (MICARDIS HCT) 40-12.5 MG tablet TAKE ONE TABLET BY MOUTH  DAILY 90 tablet 3   No current facility-administered medications on file prior to visit.        ROS:  All others reviewed and negative.  Objective        PE:  BP 124/72 (BP Location: Left Arm, Patient Position: Sitting, Cuff Size: Large)   Pulse (!) 59   Temp 97.9 F (36.6 C) (Oral)   Ht '5\' 6"'$  (1.676 m)   Wt 171 lb (77.6 kg)   LMP 09/13/1994   SpO2 96%   BMI 27.60 kg/m                 Constitutional: Pt appears in NAD               HENT: Head: NCAT.                Right Ear: External ear normal.                 Left Ear: External ear normal.                Eyes: . Pupils are equal, round, and reactive to light. Conjunctivae and EOM are normal               Nose: without d/c or deformity               Neck: Neck supple. Gross normal ROM               Cardiovascular: Normal rate and regular rhythm.                 Pulmonary/Chest: Effort normal and breath sounds without rales or wheezing.                 Abd:  Soft, NT, ND, + BS, no organomegaly               Neurological: Pt is alert. At baseline orientation, motor grossly intact               Skin: Skin is warm. No rashes, no other new lesions, LE edema - none               Psychiatric: Pt behavior is normal without agitation   Micro: none  Cardiac tracings I have personally interpreted today:  none  Pertinent Radiological findings (summarize): none   Lab Results  Component Value Date   WBC 7.2 08/02/2022   HGB 13.9 08/02/2022   HCT 42.1 08/02/2022   PLT 149.0 (L) 08/02/2022   GLUCOSE 101 (H) 08/02/2022   CHOL 127 08/02/2022   TRIG 102.0 08/02/2022   HDL 46.10 08/02/2022   LDLCALC 61 08/02/2022   ALT 13 08/02/2022   AST 15 08/02/2022   NA 139 08/02/2022   K 4.4 08/02/2022   CL 104 08/02/2022   CREATININE 0.74 08/02/2022   BUN 20 08/02/2022   CO2 29 08/02/2022   TSH 0.61 08/02/2022   HGBA1C 5.9 08/02/2022   Assessment/Plan:  Mary Ward is a 76 y.o. White or Caucasian [1] female with  has a past medical history of Allergy, EN (erythema nodosum), Hyperlipidemia, Hyperplastic colonic polyp, Hypertension, Hypothyroidism, and Obesity.  B12 deficiency Lab Results  Component Value Date   [id number] 924 08/02/2022   Low, to start oral replacement - b12 1000 mcg qd   Left lumbar radiculopathy With recent flare, moderate without neuro change - for tramadol 50 qhs prn, prednisone taper, and f/u with NS as planned, also refer PT  Hypothyroidism Lab Results  Component Value Date   TSH 0.61 08/02/2022   Stable, pt to continue levothyroxine 125 mg  - DAW only  Hyperlipidemia Lab Results  Component Value Date   LDLCALC 61 08/02/2022   Stable, pt to continue current statin lipitor 20 mg qd   Hyperglycemia Lab Results  Component Value Date   HGBA1C 5.9 08/02/2022   Stable, pt to continue current medical treatment  - diet, wt control, excercsie  Followup: Return in about 6 months (around  02/07/2023).  Cathlean Cower, MD 08/09/2022 8:50 PM [facility]

## 2022-08-09 NOTE — Assessment & Plan Note (Signed)
Lab Results  Component Value Date   LDLCALC 61 08/02/2022   Stable, pt to continue current statin lipitor 20 mg qd

## 2022-08-09 NOTE — Assessment & Plan Note (Signed)
With recent flare, moderate without neuro change - for tramadol 50 qhs prn, prednisone taper, and f/u with NS as planned, also refer PT

## 2022-08-15 ENCOUNTER — Other Ambulatory Visit: Payer: Self-pay | Admitting: Internal Medicine

## 2022-08-17 ENCOUNTER — Telehealth: Payer: Self-pay | Admitting: Internal Medicine

## 2022-08-17 MED ORDER — TRAMADOL HCL 50 MG PO TABS: ORAL_TABLET | ORAL | 1 refills | Status: DC

## 2022-08-17 NOTE — Telephone Encounter (Signed)
Don erx 

## 2022-08-17 NOTE — Telephone Encounter (Signed)
Patient needs an rx for tramadol - she was only given a few to start with.  Please call these into [facility] on [address]

## 2022-08-17 NOTE — Telephone Encounter (Signed)
Patient is requesting a refill for Tramadol, was given a quantity of 7 on 11/27

## 2022-08-19 ENCOUNTER — Ambulatory Visit: Payer: PPO | Attending: Internal Medicine

## 2022-08-19 ENCOUNTER — Other Ambulatory Visit: Payer: Self-pay

## 2022-08-19 DIAGNOSIS — M5442 Lumbago with sciatica, left side: Secondary | ICD-10-CM | POA: Diagnosis not present

## 2022-08-19 DIAGNOSIS — M5416 Radiculopathy, lumbar region: Secondary | ICD-10-CM | POA: Diagnosis not present

## 2022-08-19 DIAGNOSIS — M6281 Muscle weakness (generalized): Secondary | ICD-10-CM | POA: Diagnosis not present

## 2022-08-19 DIAGNOSIS — R252 Cramp and spasm: Secondary | ICD-10-CM | POA: Diagnosis not present

## 2022-08-19 NOTE — Therapy (Signed)
OUTPATIENT PHYSICAL THERAPY THORACOLUMBAR EVALUATION   Patient Name: Mary Ward MRN: [id number] DOB:08-05-1946, 76 y.o., female Today's Date: 08/20/2022  END OF SESSION:  PT End of Session - 08/19/22 1200     Visit Number 1    Number of Visits 7    Date for PT Re-Evaluation 10/01/22    Authorization Type [employer]    PT Start Time 1150    PT Stop Time 4098    PT Time Calculation (min) 45 min    Activity Tolerance Patient tolerated treatment well    Behavior During Therapy WFL for tasks assessed/performed             Past Medical History:  Diagnosis Date   Allergy    dust, dogs, grass   EN (erythema nodosum)    Hyperlipidemia    Hyperplastic colonic polyp    Hypertension    Hypothyroidism    Obesity    Past Surgical History:  Procedure Laterality Date   ABDOMINAL HYSTERECTOMY     BREAST REDUCTION SURGERY  2002   BUNIONECTOMY     both feet   COLONOSCOPY  01/22/2014   CYST EXCISION  12/15/12   punch biopsy left shoulder blade   HAMMER TOE SURGERY     right 2nd toe   LAPAROSCOPIC ASSISTED VAGINAL HYSTERECTOMY  1992   with BSO   OOPHORECTOMY     TONSILLECTOMY     Patient Active Problem List   Diagnosis Date Noted   Acquired deformity of lower leg 08/09/2022   B12 deficiency 08/09/2022   Left lumbar radiculopathy 04/24/2022   Osteopenia 04/24/2022   Abdominal pain 01/19/2022   Allergic rhinitis 11/05/2020   Acute diverticulitis 04/11/2020   Right renal mass 04/11/2020   Insomnia 01/12/2020   Chest pain 11/05/2017   Cough 12/18/2015   Coronary artery calcification seen on CAT scan 12/19/2014   Hyperglycemia 12/19/2014   Preventative health care 11/12/2011   MENOPAUSAL DISORDER 09/29/2009   ELECTROCARDIOGRAM, ABNORMAL 08/07/2008   GERD 08/06/2007   COLONIC POLYPS, HX OF 08/06/2007   Hypothyroidism 05/07/2007   Hyperlipidemia 05/07/2007   OBESITY 05/07/2007   Anxiety with depression 05/07/2007   Essential hypertension 05/07/2007    Erythema nodosum 04/28/2007    PCP: Biagio Borg, MD  REFERRING PROVIDER: Biagio Borg, MD   REFERRING DIAG: M54.16 (ICD-10-CM) - Left lumbar radiculopathy   Rationale for Evaluation and Treatment: Rehabilitation  THERAPY DIAG:  Acute left-sided low back pain with left-sided sciatica  Muscle weakness (generalized)  Cramp and spasm  ONSET DATE: 4 months  SUBJECTIVE:  SUBJECTIVE STATEMENT: Pt reports she developed L low back, gulteal, and L LE pain which primarily bother her at night. Pt also endorses intermittent N/T of her L LE. Pt is not able to recall a specific MOI. She notes making some progress with chiropratic care, but 2 weeks ago she stared a predisone prescription and that has help a lot. Pt reports she still experiences intermittent L low back and gluteal pain, primarily gluteal, and she is managing with ex, massage gun, advil and ice packs.  PERTINENT HISTORY:  HTN  PAIN:  Are you having pain? Yes: NPRS scale: 1/10 Pain location: L low back, gluteal and LE Pain description: ache Aggravating factors: prolonged standing still, at night  Relieving factors: exs, massage gun, advil, ice pack  PRECAUTIONS: None  WEIGHT BEARING RESTRICTIONS: No  FALLS:  Has patient fallen in last 6 months? No  LIVING ENVIRONMENT: Lives with: lives with their family Lives in: House/apartment Pt is able to access and be mobile within home  OCCUPATION: Retired, exs 3x per week, house and yard work  PLOF: Independent  PATIENT GOALS: To be active with less pain, to have a HEP which can help limit her symptoms  NEXT MD VISIT:6 months  OBJECTIVE:   DIAGNOSTIC FINDINGS:  04/27/22 Lumbar MRI IMPRESSION: Mild discogenic endplate edematous changes at L2-3 and L4-5 could relate to regional back  pain.   Lumbar region degenerative disc disease and degenerative facet disease which could in general relate to regional pain.   Foraminal narrowing at L4-5 left worse than right due to encroachment by osteophytes and disc material. In particular, the left L4 nerve could possibly be affected.   PATIENT SURVEYS:  FOTO: Perceived function   49%, predicted   72%   SCREENING FOR RED FLAGS: Bowel or bladder incontinence: No Spinal tumors: No Cauda equina syndrome: No Compression fracture: No  COGNITION: Overall cognitive status: Within functional limits for tasks assessed     SENSATION: WFL  MUSCLE LENGTH: Hamstrings: Right tight ; Left tight deg Thomas test: Right NT deg; Left NT deg  POSTURE: rounded shoulders, forward head, decreased lumbar lordosis, and increased thoracic kyphosis  PALPATION: TTP L gluteal area increased muscle tightness  LUMBAR ROM:   AROM eval  Flexion WNLs  pull both hanstrings  Extension WNLs mild pressure  Right lateral flexion WNLs Min stretch L low back  Left lateral flexion WNLs Mins stretch R Low back  Right rotation WNLs  Left rotation WNLs   (Blank rows = not tested)  LOWER EXTREMITY ROM:     Passive  Right eval Left eval  Hip flexion    Hip extension    Hip abduction    Hip adduction    Hip internal rotation Decreased Decreased c pain  Hip external rotation    Knee flexion    Knee extension    Ankle dorsiflexion    Ankle plantarflexion    Ankle inversion    Ankle eversion     (Blank rows = not tested)  LOWER EXTREMITY MMT:    MMT Right eval Left eval  Hip flexion 5 4+  Hip extension 4+ 4  Hip abduction 4+ 4  Hip adduction    Hip internal rotation    Hip external rotation 4+ 4  Knee flexion 5 5  Knee extension 5 5  Ankle dorsiflexion    Ankle plantarflexion    Ankle inversion    Ankle eversion     (Blank rows = not tested)  LUMBAR SPECIAL TESTS:  Straight leg raise test: Negative, Slump test: Negative, SI  Compression/distraction test: Negative, and FABER test: Negative  FUNCTIONAL TESTS:  NT  GAIT: Distance walked: 200' Assistive device utilized: None Level of assistance: Complete Independence Comments: WNLs  TODAY'S TREATMENT:                                                                                                                               [facility] Adult PT Treatment:                                                DATE: 08/19/22 Therapeutic Exercise: Developed, instructed pt, and pt completed therex as noted in HEP  Self Care: Tennis ball massage, cold pack, sleeping positions with supoort for comfort    PATIENT EDUCATION:  Education details: Eval findings, POC, HEP, self care  Person educated: Patient Education method: Explanation, Demonstration, Tactile cues, Verbal cues, and Handouts Education comprehension: verbalized understanding, returned demonstration, verbal cues required, and tactile cues required  HOME EXERCISE PROGRAM: Access Code: [email or web address] URL: [email or web address] Date: 08/19/2022 Prepared by: Gar Ponto  Exercises - Supine Bridge  - 1 x daily - 7 x weekly - 1-2 sets - 10 reps - 5 hold - Hooklying Clamshell with Resistance  - 1 x daily - 7 x weekly - 1-2 sets - 10 reps - 5 hold - Supine Hip Adduction Isometric with Ball  - 1 x daily - 7 x weekly - 1-2 sets - 10 reps - 5 hold  ASSESSMENT:  CLINICAL IMPRESSION: Patient is a 76 y.o. female who was seen today for physical therapy evaluation and treatment for M54.16 (ICD-10-CM) - Left lumbar radiculopathy. Pt presents with decreased L hip strength, tight hamstrings, decreased hip IR ROM, and pain reproduced with palpation of the gluteal muscles. Pt appears to be most symptomatic of piriformis dysfunction, but degenerative issues of her lumbar spine may also be a factor with her pain. Pt will benefit from skilled PT to address impairments for improved lumbopelvic function with less  pain.  OBJECTIVE IMPAIRMENTS: decreased activity tolerance, decreased ROM, increased muscle spasms, postural dysfunction, and obesity.   ACTIVITY LIMITATIONS: lifting, standing, and sleeping  PARTICIPATION LIMITATIONS: yard work and exercise  PERSONAL FACTORS: Past/current experiences and Time since onset of injury/illness/exacerbation are also affecting patient's functional outcome.   REHAB POTENTIAL: Good  CLINICAL DECISION MAKING: Stable/uncomplicated  EVALUATION COMPLEXITY: Low   GOALS:  SHORT TERM GOALS=LTGs  [place] TERM GOALS: Target date: 10/01/22  Pt will be Ind in a final HEP to maintain achieved LOF  Baseline: initiated Goal status: INITIAL  2.  Pt will voice understanding of measures to assist in pain reduction  Baseline: initiated Goal status: INITIAL  3.  Increase L hip strength to 4+ or greater strength for improvd functional ability with less pain Baseline: See  flow sheets Goal status: INITIAL  4.  Pt's FOTO score will improved to the predicted value of 72% as indication of improved function  Baseline: 49% Goal status: INITIAL  PLAN:  PT FREQUENCY: 1x/week  PT DURATION: 6 weeks  PLANNED INTERVENTIONS: Therapeutic exercises, Therapeutic activity, Balance training, Gait training, Patient/Family education, Self Care, Joint mobilization, Dry Needling, Electrical stimulation, Spinal manipulation, Spinal mobilization, Cryotherapy, Moist heat, Taping, Ultrasound, Ionotophoresis '4mg'$ /ml Dexamethasone, Manual therapy, and Re-evaluation.  PLAN FOR NEXT SESSION: Review FOTO; assess response to HEP; progress therex as indicated; use of modalities, manual therapy; and TPDN as indicated.  Dorion Petillo MS, PT 08/20/22 10:15 AM

## 2022-08-23 ENCOUNTER — Ambulatory Visit (INDEPENDENT_AMBULATORY_CARE_PROVIDER_SITE_OTHER): Payer: PPO

## 2022-08-23 VITALS — BP 120/60 | HR 60 | Temp 97.7°F | Resp 16 | Ht 66.0 in | Wt 172.6 lb

## 2022-08-23 DIAGNOSIS — Z0000 Encounter for general adult medical examination without abnormal findings: Secondary | ICD-10-CM

## 2022-08-23 NOTE — Progress Notes (Addendum)
Subjective:   Mary Ward is a 76 y.o. female who presents for Medicare Annual (Subsequent) preventive examination.  Review of Systems     Cardiac Risk Factors include: advanced age (>30mn, >>3women);dyslipidemia;family history of premature cardiovascular disease;hypertension     Objective:    Today's Vitals   08/23/22 1001  BP: 120/60  Pulse: 60  Resp: 16  Temp: 97.7 F (36.5 C)  TempSrc: Temporal  SpO2: 97%  Weight: 172 lb 9.6 oz (78.3 kg)  Height: '5\' 6"'$  (1.676 m)  PainSc: 0-No pain   Body mass index is 27.86 kg/m.     08/19/2022   11:55 AM 08/21/2021   11:45 AM 07/07/2020    2:07 PM 04/07/2020    9:18 AM 01/08/2014   11:05 AM  Advanced Directives  Does Patient Have a Medical Advance Directive? No Yes Yes No Patient has advance directive, copy not in chart  Type of Advance Directive  Living will;[place]care Power [place]    Does patient want to make changes to medical advance directive?  No - Patient declined No - Patient declined    Copy of [place]?  No - copy requested No - copy requested    Would patient like information on creating a medical advance directive? No - Patient declined        Current Medications (verified) Outpatient Encounter Medications as of 08/23/2022  Medication Sig   aspirin 81 MG tablet Take 81 mg by mouth daily.   atorvastatin (LIPITOR) 20 MG tablet TAKE ONE TABLET BY MOUTH DAILY   meloxicam (MOBIC) 15 MG tablet Take 1 tablet (15 mg total) by mouth daily as needed for pain.   polyethylene glycol (MIRALAX / GLYCOLAX) 17 g packet Take 17 g by mouth daily.   SYNTHROID 125 MCG tablet TAKE ONE TABLET BY MOUTH DAILY   telmisartan-hydrochlorothiazide (MICARDIS HCT) 40-12.5 MG tablet TAKE ONE TABLET BY MOUTH DAILY   traMADol (ULTRAM) 50 MG tablet 1 tab by mouth once at bedtime as needed for pain   [DISCONTINUED] predniSONE (DELTASONE) 10 MG tablet 2 tabs by mouth per day  for 5 days   No facility-administered encounter medications on file as of 08/23/2022.    Allergies (verified) Indomethacin, Prednisone, Prevnar 13 [pneumococcal 13-val conj vacc], Tetanus toxoid, and Tetracycline   History: Past Medical History:  Diagnosis Date   Allergy    dust, dogs, grass   EN (erythema nodosum)    Hyperlipidemia    Hyperplastic colonic polyp    Hypertension    Hypothyroidism    Obesity    Past Surgical History:  Procedure Laterality Date   ABDOMINAL HYSTERECTOMY     BREAST REDUCTION SURGERY  2002   BUNIONECTOMY     both feet   COLONOSCOPY  01/22/2014   CYST EXCISION  12/15/12   punch biopsy left shoulder blade   HAMMER TOE SURGERY     right 2nd toe   LAPAROSCOPIC ASSISTED VAGINAL HYSTERECTOMY  1992   with BSO   OOPHORECTOMY     TONSILLECTOMY     Family History  Problem Relation Age of Onset   Hyperlipidemia Mother    Stroke Mother    Hypertension Mother    Heart attack Mother    Cancer Father        Lung   Diabetes Sister    Heart disease Sister        CAD   Hypertension Sister    Colon  cancer Neg Hx    Esophageal cancer Neg Hx    Rectal cancer Neg Hx    Stomach cancer Neg Hx    Colon polyps Neg Hx    Social History   Socioeconomic History   Marital status: Married    Spouse name: Not on file   Number of children: 0   Years of education: Not on file   Highest education level: High school graduate  Occupational History   Occupation: Psychologist, occupational: [employer]  Tobacco Use   Smoking status: Never   Smokeless tobacco: Never  Vaping Use   Vaping Use: Never used  Substance and Sexual Activity   Alcohol use: Not Currently    Alcohol/week: 0.0 standard drinks of alcohol   Drug use: No   Sexual activity: Never    Birth control/protection: Post-menopausal, Surgical    Comment: Hysterectomy  Other Topics Concern   Not on file  Social History Narrative   Family history of high Cholesterol   Social  Determinants of Health   Financial Resource Strain: Low Risk  (08/23/2022)   Overall Financial Resource Strain (CARDIA)    Difficulty of Paying Living Expenses: Not hard at all  Food Insecurity: No Food Insecurity (08/23/2022)   Hunger Vital Sign    Worried About Running Out of Food in the Last Year: Never true    [place] in the Last Year: Never true  Transportation Needs: No Transportation Needs (08/23/2022)   PRAPARE - Hydrologist (Medical): No    Lack of Transportation (Non-Medical): No  Physical Activity: Sufficiently Active (08/23/2022)   Exercise Vital Sign    Days of Exercise per Week: 3 days    Minutes of Exercise per Session: 60 min  Stress: No Stress Concern Present (08/23/2022)   [place]    Feeling of Stress : Only a little  Social Connections: Socially Integrated (08/23/2022)   Social Connection and Isolation Panel [NHANES]    Frequency of Communication with Friends and Family: More than three times a week    Frequency of Social Gatherings with Friends and Family: More than three times a week    Attends Religious Services: More than 4 times per year    Active Member of [employer] or Organizations: No    Attends Music therapist: More than 4 times per year    Marital Status: Married    Tobacco Counseling Counseling given: Not Answered   Clinical Intake:  Pre-visit preparation completed: Yes  Pain : No/denies pain Pain Score: 0-No pain     BMI - recorded: 27.86 Nutritional Status: BMI 25 -29 Overweight Nutritional Risks: None Diabetes: No CBG done?: No Did pt. bring in CBG monitor from home?: No  How often do you need to have someone help you when you read instructions, pamphlets, or other written materials from your doctor or pharmacy?: 1 - Never What is the last grade level you completed in school?: HSG; some college  Diabetic?  No  Interpreter Needed?: No  Information entered by :: Lisette Abu, LPN.   Activities of Daily Living    08/23/2022   10:05 AM 08/19/2022    9:26 AM  In your present state of health, do you have any difficulty performing the following activities:  Hearing? 0 0  Vision? 0 0  Difficulty concentrating or making decisions? 0 0  Walking or climbing stairs? 0 0  Dressing or bathing? 0 0  Doing errands, shopping? 0 0  Preparing Food and eating ? N N  Using the Toilet? N N  In the past six months, have you accidently leaked urine? N N  Do you have problems with loss of bowel control? N N  Managing your Medications? N N  Managing your Finances? N N  Housekeeping or managing your Housekeeping? N N    Patient Care Team: Biagio Borg, MD as PCP - General Donato Heinz, MD as PCP - Cardiology (Cardiology) Kennith Center, RD as Dietitian (Family Medicine) Marica Otter, [place] as Consulting Physician (Optometry)  Indicate any recent Medical Services you may have received from other than [facility] providers in the past year (date may be approximate).     Assessment:   This is a routine wellness examination for Mary Ward.  Hearing/Vision screen Hearing Screening - Comments:: Denies hearing difficulties   Vision Screening - Comments:: Wears rx glasses - up to date with routine eye exams with Marica Otter, OD.   Dietary issues and exercise activities discussed: Current Exercise Habits: Home exercise routine;Structured exercise class, Type of exercise: walking;treadmill;stretching;strength training/weights;exercise ball;calisthenics;Other - see comments (Weight Watchers), Time (Minutes): 30, Frequency (Times/Week): 5, Weekly Exercise (Minutes/Week): 150, Intensity: Moderate, Exercise limited by: None identified   Goals Addressed               This Visit's Progress     Patient Stated (pt-stated)        Continue to weight watchers and to lose 5 pounds.      Depression  Screen    08/23/2022   10:03 AM 08/09/2022    8:04 AM 04/22/2022    2:05 PM 02/04/2022    8:17 AM 02/04/2022    8:02 AM 01/19/2022    2:30 PM 08/21/2021   11:44 AM  PHQ 2/9 Scores  PHQ - 2 Score 0 0 0 0 0 0 0  PHQ- 9 Score 0 0 0  1 1     Fall Risk    08/23/2022   10:36 AM 08/19/2022    9:26 AM 08/09/2022    8:04 AM 04/22/2022    2:05 PM 02/04/2022    8:17 AM  Fall Risk   Falls in the past year? 1 1 0 0 0  Number falls in past yr: 0 0  0 0  Injury with Fall? 0   0 0  Risk for fall due to :   No Fall Risks No Fall Risks   Follow up Falls evaluation completed  Falls evaluation completed Falls evaluation completed     [place]:  Any stairs in or around the home? Yes  If so, are there any without handrails? No  Home free of loose throw rugs in walkways, pet beds, electrical cords, etc? Yes  Adequate lighting in your home to reduce risk of falls? Yes   ASSISTIVE DEVICES UTILIZED TO PREVENT FALLS:  Life alert? No  Use of a cane, walker or w/c? No  Grab bars in the bathroom? Yes  Shower chair or bench in shower? Yes  Elevated toilet seat or a handicapped toilet? Yes   TIMED UP AND GO:  Was the test performed? Yes .  Length of time to ambulate 10 feet: 8 sec.   Gait steady and fast without use of assistive device  Cognitive Function:        08/23/2022   10:05 AM  6CIT Screen  What Year?  0 points  What month? 0 points  What time? 0 points  Count back from 20 0 points  Months in reverse 0 points  Repeat phrase 0 points  Total Score 0 points    Immunizations Immunization History  Administered Date(s) Administered   Fluad Quad(high Dose 65+) 06/23/2019, 07/02/2020, 06/26/2021, 06/21/2022   Influenza Whole 07/30/1999, 07/14/2006, 07/03/2007, 06/13/2008, 06/13/2009   Influenza, High Dose Seasonal PF 06/23/2018   Influenza, Seasonal, Injecte, Preservative Fre 06/13/2013   Influenza-Unspecified 06/20/2014, 06/19/2019, 06/18/2020    PFIZER(Purple Top)SARS-COV-2 Vaccination 10/18/2019, 11/12/2019, 06/09/2020   Pfizer Covid-19 Vaccine Bivalent Booster 60yr & up 06/04/2021   Pneumococcal Conjugate-13 11/16/2013   Pneumococcal Polysaccharide-23 08/07/2007, 11/15/2012   Pneumococcal-Unspecified 03/02/2018   Td 09/13/1994   Varicella 02/12/1952   Zoster Recombinat (Shingrix) 03/21/2019, 05/22/2019   Zoster, Live 01/01/2008, 05/29/2019    TDAP status: allergic to vaccine  Flu Vaccine status: Up to date  Pneumococcal vaccine status: Up to date  Covid-19 vaccine status: Completed vaccines  Qualifies for Shingles Vaccine? Yes   Zostavax completed Yes   Shingrix Completed?: Yes  Screening Tests Health Maintenance  Topic Date Due   DTaP/Tdap/Td (2 - Tdap) 09/13/2004   Medicare Annual Wellness (AWV)  08/24/2023   COLONOSCOPY (Pts 45-461yrInsurance coverage will need to be confirmed)  04/04/2026   Pneumonia Vaccine 6539Years old  Completed   INFLUENZA VACCINE  Completed   DEXA SCAN  Completed   Hepatitis C Screening  Completed   Zoster Vaccines- Shingrix  Completed   HPV VACCINES  Aged Out   COVID-19 Vaccine  Discontinued    Health Maintenance  Health Maintenance Due  Topic Date Due   DTaP/Tdap/Td (2 - Tdap) 09/13/2004    Colorectal cancer screening: Type of screening: Colonoscopy. Completed 04/05/2019. Repeat every 7 years  Mammogram status: Completed 12/2021. Repeat every year ([facility] Density status: Completed 5/26/20223. Results reflect: Bone density results: OSTEOPENIA. Repeat every 2-3 years.  Lung Cancer Screening: (Low Dose CT Chest recommended if Age 75-80ears, 30 pack-year currently smoking OR have quit w/in 15years.) does not qualify.   Lung Cancer Screening Referral: no  Additional Screening:  Hepatitis C Screening: does qualify; Completed 12/18/2015  Vision Screening: Recommended annual ophthalmology exams for early detection of glaucoma and other disorders  of the eye. Is the patient up to date with their annual eye exam?  Yes  Who is the provider or what is the name of the office in which the patient attends annual eye exams? [facility]. If pt is not established with a provider, would they like to be referred to a provider to establish care? No .   Dental Screening: Recommended annual dental exams for proper oral hygiene  Community Resource Referral / Chronic Care Management: CRR required this visit?  No   CCM required this visit?  No .     Plan:     I have personally reviewed and noted the following in the patient's chart:   Medical and social history Use of alcohol, tobacco or illicit drugs  Current medications and supplements including opioid prescriptions. Patient is not currently taking opioid prescriptions. Functional ability and status Nutritional status Physical activity Advanced directives List of other physicians Hospitalizations, surgeries, and ER visits in previous 12 months Vitals Screenings to include cognitive, depression, and falls Referrals and appointments  In addition, I have reviewed and discussed with patient certain preventive protocols, quality metrics, and best practice recommendations. A written personalized care plan for  preventive services as well as general preventive health recommendations were provided to patient.     Sheral Flow, LPN   29/56/2130   Nurse Notes: Normal cognitive status assessed by direct observation by this Nurse Health Advisor. No abnormalities found.   Medical screening examination/treatment/procedure(s) were performed by non-physician practitioner and as supervising physician I was immediately available for consultation/collaboration.  I agree with above. Lew Dawes, MD

## 2022-08-23 NOTE — Patient Instructions (Addendum)
Mary Ward , Thank you for taking time to come for your Medicare Wellness Visit. I appreciate your ongoing commitment to your health goals. Please review the following plan we discussed and let me know if I can assist you in the future.   These are the goals we discussed:  Goals       Patient Stated (pt-stated)      Continue to do weight watchers and to lose 5 pounds.        This is a list of the screening recommended for you and due dates:  Health Maintenance  Topic Date Due   DTaP/Tdap/Td vaccine (2 - Tdap) 09/13/2004   Medicare Annual Wellness Visit  08/24/2023   Colon Cancer Screening  04/04/2026   Pneumonia Vaccine  Completed   Flu Shot  Completed   DEXA scan (bone density measurement)  Completed   Hepatitis C Screening: USPSTF Recommendation to screen - Ages 56-79 yo.  Completed   Zoster (Shingles) Vaccine  Completed   HPV Vaccine  Aged Out   COVID-19 Vaccine  Discontinued    Advanced directives: Yes  Conditions/risks identified: Yes  Next appointment: Follow up in one year for your annual wellness visit.   Preventive Care 14 Years and Older, Female Preventive care refers to lifestyle choices and visits with your health care provider that can promote health and wellness. What does preventive care include? A yearly physical exam. This is also called an annual well check. Dental exams once or twice a year. Routine eye exams. Ask your health care provider how often you should have your eyes checked. Personal lifestyle choices, including: Daily care of your teeth and gums. Regular physical activity. Eating a healthy diet. Avoiding tobacco and drug use. Limiting alcohol use. Practicing safe sex. Taking low-dose aspirin every day. Taking vitamin and mineral supplements as recommended by your health care provider. What happens during an annual well check? The services and screenings done by your health care provider during your annual well check will depend on your age,  overall health, lifestyle risk factors, and family history of disease. Counseling  Your health care provider may ask you questions about your: Alcohol use. Tobacco use. Drug use. Emotional well-being. Home and relationship well-being. Sexual activity. Eating habits. History of falls. Memory and ability to understand (cognition). Work and work Statistician. Reproductive health. Screening  You may have the following tests or measurements: Height, weight, and BMI. Blood pressure. Lipid and cholesterol levels. These may be checked every 5 years, or more frequently if you are over 47 years old. Skin check. Lung cancer screening. You may have this screening every year starting at age 42 if you have a 30-pack-year history of smoking and currently smoke or have quit within the past 15 years. Fecal occult blood test (FOBT) of the stool. You may have this test every year starting at age 30. Flexible sigmoidoscopy or colonoscopy. You may have a sigmoidoscopy every 5 years or a colonoscopy every 10 years starting at age 9. Hepatitis C blood test. Hepatitis B blood test. Sexually transmitted disease (STD) testing. Diabetes screening. This is done by checking your blood sugar (glucose) after you have not eaten for a while (fasting). You may have this done every 1-3 years. Bone density scan. This is done to screen for osteoporosis. You may have this done starting at age 5. Mammogram. This may be done every 1-2 years. Talk to your health care provider about how often you should have regular mammograms. Talk with your health  care provider about your test results, treatment options, and if necessary, the need for more tests. Vaccines  Your health care provider may recommend certain vaccines, such as: Influenza vaccine. This is recommended every year. Tetanus, diphtheria, and acellular pertussis (Tdap, Td) vaccine. You may need a Td booster every 10 years. Zoster vaccine. You may need this after age  41. Pneumococcal 13-valent conjugate (PCV13) vaccine. One dose is recommended after age 17. Pneumococcal polysaccharide (PPSV23) vaccine. One dose is recommended after age 73. Talk to your health care provider about which screenings and vaccines you need and how often you need them. This information is not intended to replace advice given to you by your health care provider. Make sure you discuss any questions you have with your health care provider. Document Released: 09/26/2015 Document Revised: 05/19/2016 Document Reviewed: 07/01/2015 Elsevier Interactive Patient Education  2017 [employer] Prevention in the Home Falls can cause injuries. They can happen to people of all ages. There are many things you can do to make your home safe and to help prevent falls. What can I do on the outside of my home? Regularly fix the edges of walkways and driveways and fix any cracks. Remove anything that might make you trip as you walk through a door, such as a raised step or threshold. Trim any bushes or trees on the path to your home. Use bright outdoor lighting. Clear any walking paths of anything that might make someone trip, such as rocks or tools. Regularly check to see if handrails are loose or broken. Make sure that both sides of any steps have handrails. Any raised decks and porches should have guardrails on the edges. Have any leaves, snow, or ice cleared regularly. Use sand or salt on walking paths during winter. Clean up any spills in your garage right away. This includes oil or grease spills. What can I do in the bathroom? Use night lights. Install grab bars by the toilet and in the tub and shower. Do not use towel bars as grab bars. Use non-skid mats or decals in the tub or shower. If you need to sit down in the shower, use a plastic, non-slip stool. Keep the floor dry. Clean up any water that spills on the floor as soon as it happens. Remove soap buildup in the tub or shower  regularly. Attach bath mats securely with double-sided non-slip rug tape. Do not have throw rugs and other things on the floor that can make you trip. What can I do in the bedroom? Use night lights. Make sure that you have a light by your bed that is easy to reach. Do not use any sheets or blankets that are too big for your bed. They should not hang down onto the floor. Have a firm chair that has side arms. You can use this for support while you get dressed. Do not have throw rugs and other things on the floor that can make you trip. What can I do in the kitchen? Clean up any spills right away. Avoid walking on wet floors. Keep items that you use a lot in easy-to-reach places. If you need to reach something above you, use a strong step stool that has a grab bar. Keep electrical cords out of the way. Do not use floor polish or wax that makes floors slippery. If you must use wax, use non-skid floor wax. Do not have throw rugs and other things on the floor that can make you trip. What can  I do with my stairs? Do not leave any items on the stairs. Make sure that there are handrails on both sides of the stairs and use them. Fix handrails that are broken or loose. Make sure that handrails are as [place] as the stairways. Check any carpeting to make sure that it is firmly attached to the stairs. Fix any carpet that is loose or worn. Avoid having throw rugs at the top or bottom of the stairs. If you do have throw rugs, attach them to the floor with carpet tape. Make sure that you have a light switch at the top of the stairs and the bottom of the stairs. If you do not have them, ask someone to add them for you. What else can I do to help prevent falls? Wear shoes that: Do not have high heels. Have rubber bottoms. Are comfortable and fit you well. Are closed at the toe. Do not wear sandals. If you use a stepladder: Make sure that it is fully opened. Do not climb a closed stepladder. Make sure that  both sides of the stepladder are locked into place. Ask someone to hold it for you, if possible. Clearly mark and make sure that you can see: Any grab bars or handrails. First and last steps. Where the edge of each step is. Use tools that help you move around (mobility aids) if they are needed. These include: Canes. Walkers. Scooters. Crutches. Turn on the lights when you go into a dark area. Replace any light bulbs as soon as they burn out. Set up your furniture so you have a clear path. Avoid moving your furniture around. If any of your floors are uneven, fix them. If there are any pets around you, be aware of where they are. Review your medicines with your doctor. Some medicines can make you feel dizzy. This can increase your chance of falling. Ask your doctor what other things that you can do to help prevent falls. This information is not intended to replace advice given to you by your health care provider. Make sure you discuss any questions you have with your health care provider. Document Released: 06/26/2009 Document Revised: 02/05/2016 Document Reviewed: 10/04/2014 Elsevier Interactive Patient Education  2017 [employer].

## 2022-08-25 ENCOUNTER — Ambulatory Visit: Payer: PPO

## 2022-08-25 DIAGNOSIS — M5442 Lumbago with sciatica, left side: Secondary | ICD-10-CM

## 2022-08-25 DIAGNOSIS — R252 Cramp and spasm: Secondary | ICD-10-CM

## 2022-08-25 DIAGNOSIS — M6281 Muscle weakness (generalized): Secondary | ICD-10-CM

## 2022-08-25 NOTE — Therapy (Signed)
OUTPATIENT PHYSICAL THERAPY TREATMENT NOTE   Patient Name: Mary Ward MRN: [id number] DOB:05-24-1946, 76 y.o., female Today's Date: 08/25/2022  PCP: Biagio Borg, MD  REFERRING PROVIDER: Biagio Borg, MD   END OF SESSION:   PT End of Session - 08/25/22 1102     Visit Number 2    Number of Visits 7    Date for PT Re-Evaluation 10/01/22    Authorization Type [employer]    PT Start Time 1101    PT Stop Time 1145    PT Time Calculation (min) 44 min    Activity Tolerance Patient tolerated treatment well    Behavior During Therapy WFL for tasks assessed/performed             Past Medical History:  Diagnosis Date   Allergy    dust, dogs, grass   EN (erythema nodosum)    Hyperlipidemia    Hyperplastic colonic polyp    Hypertension    Hypothyroidism    Obesity    Past Surgical History:  Procedure Laterality Date   ABDOMINAL HYSTERECTOMY     BREAST REDUCTION SURGERY  2002   BUNIONECTOMY     both feet   COLONOSCOPY  01/22/2014   CYST EXCISION  12/15/12   punch biopsy left shoulder blade   HAMMER TOE SURGERY     right 2nd toe   LAPAROSCOPIC ASSISTED VAGINAL HYSTERECTOMY  1992   with BSO   OOPHORECTOMY     TONSILLECTOMY     Patient Active Problem List   Diagnosis Date Noted   Acquired deformity of lower leg 08/09/2022   B12 deficiency 08/09/2022   Left lumbar radiculopathy 04/24/2022   Osteopenia 04/24/2022   Abdominal pain 01/19/2022   Allergic rhinitis 11/05/2020   Acute diverticulitis 04/11/2020   Right renal mass 04/11/2020   Insomnia 01/12/2020   Chest pain 11/05/2017   Cough 12/18/2015   Coronary artery calcification seen on CAT scan 12/19/2014   Hyperglycemia 12/19/2014   Preventative health care 11/12/2011   MENOPAUSAL DISORDER 09/29/2009   ELECTROCARDIOGRAM, ABNORMAL 08/07/2008   GERD 08/06/2007   COLONIC POLYPS, HX OF 08/06/2007   Hypothyroidism 05/07/2007   Hyperlipidemia 05/07/2007   OBESITY 05/07/2007   Anxiety with  depression 05/07/2007   Essential hypertension 05/07/2007   Erythema nodosum 04/28/2007    REFERRING DIAG: M54.16 (ICD-10-CM) - Left lumbar radiculopathy    THERAPY DIAG:  Acute left-sided low back pain with left-sided sciatica  Muscle weakness (generalized)  Cramp and spasm  Rationale for Evaluation and Treatment Rehabilitation  SUBJECTIVE:  SUBJECTIVE STATEMENT: Pt reports she was up on her feet all day yesterday and when she finally sat down last night she experienced L hip/thigh pain. Other than that she has had occasional twinges of pain. Pt reports she has been completing her HEP daily and is using the tennis ball for massage   PAIN:  Are you having pain? Yes: NPRS scale: 0/10 Pain location: L low back, gluteal and LE Pain description: ache Aggravating factors: prolonged standing still, at night  Relieving factors: exs, massage gun, advil, ice pack  PERTINENT HISTORY:  HTN   PRECAUTIONS: None   WEIGHT BEARING RESTRICTIONS: No   OCCUPATION: Retired, exs 3x per week, house and yard work   PATIENT GOALS: To be active with less pain, to have a HEP which can help limit her symptoms   NEXT MD VISIT:6 months   OBJECTIVE: (objective measures completed at initial evaluation unless otherwise dated)    DIAGNOSTIC FINDINGS:  04/27/22 Lumbar MRI IMPRESSION: Mild discogenic endplate edematous changes at L2-3 and L4-5 could relate to regional back pain.   Lumbar region degenerative disc disease and degenerative facet disease which could in general relate to regional pain.   Foraminal narrowing at L4-5 left worse than right due to encroachment by osteophytes and disc material. In particular, the left L4 nerve could possibly be affected.   PATIENT SURVEYS:  FOTO: Perceived function   49%,  predicted   72%    SCREENING FOR RED FLAGS: Bowel or bladder incontinence: No Spinal tumors: No Cauda equina syndrome: No Compression fracture: No   COGNITION: Overall cognitive status: Within functional limits for tasks assessed                          SENSATION: WFL   MUSCLE LENGTH: Hamstrings: Right tight ; Left tight deg Thomas test: Right NT deg; Left NT deg   POSTURE: rounded shoulders, forward head, decreased lumbar lordosis, and increased thoracic kyphosis   PALPATION: TTP L gluteal area increased muscle tightness   LUMBAR ROM:    AROM eval  Flexion WNLs  pull both hanstrings  Extension WNLs mild pressure  Right lateral flexion WNLs Min stretch L low back  Left lateral flexion WNLs Mins stretch R Low back  Right rotation WNLs  Left rotation WNLs   (Blank rows = not tested)   LOWER EXTREMITY ROM:      Passive  Right eval Left eval  Hip flexion      Hip extension      Hip abduction      Hip adduction      Hip internal rotation Decreased Decreased c pain  Hip external rotation      Knee flexion      Knee extension      Ankle dorsiflexion      Ankle plantarflexion      Ankle inversion      Ankle eversion       (Blank rows = not tested)   LOWER EXTREMITY MMT:     MMT Right eval Left eval  Hip flexion 5 4+  Hip extension 4+ 4  Hip abduction 4+ 4  Hip adduction      Hip internal rotation      Hip external rotation 4+ 4  Knee flexion 5 5  Knee extension 5 5  Ankle dorsiflexion      Ankle plantarflexion      Ankle inversion      Ankle eversion       (  Blank rows = not tested)   LUMBAR SPECIAL TESTS:  Straight leg raise test: Negative, Slump test: Negative, SI Compression/distraction test: Negative, and FABER test: Negative   FUNCTIONAL TESTS:  NT   GAIT: Distance walked: 200' Assistive device utilized: None Level of assistance: Complete Independence Comments: WNLs   TODAY'S TREATMENT: [facility] Adult PT Treatment:                                                 DATE: 08/25/22 Therapeutic Exercise: Supine Bridge 10 reps - 5 hold Hooklying Clamshell with BluTB 10 reps - 5 hold Supine Hip Adduction Isometric with Ball 10 reps - 5 hold Hamstring stretch x2 20" each Piriformis stretch x2 20" each SLS x3 30' each Updated HEP   Manual Therapy: STM/DTM to the L gluteal musculature                                                                                                                         [facility] Adult PT Treatment:                                                DATE: 08/19/22 Therapeutic Exercise: Developed, instructed pt, and pt completed therex as noted in HEP   Self Care: Tennis ball massage, cold pack, sleeping positions with supoort for comfort     PATIENT EDUCATION:  Education details: Eval findings, POC, HEP, self care  Person educated: Patient Education method: Explanation, Demonstration, Tactile cues, Verbal cues, and Handouts Education comprehension: verbalized understanding, returned demonstration, verbal cues required, and tactile cues required   HOME EXERCISE PROGRAM: Access Code: [email or web address] URL: [email or web address] Date: 08/25/2022 Prepared by: Gar Ponto  Exercises - Supine Bridge  - 1 x daily - 7 x weekly - 1-2 sets - 10 reps - 5 hold - Hooklying Clamshell with Resistance  - 1 x daily - 7 x weekly - 1-2 sets - 10 reps - 5 hold - Supine Hip Adduction Isometric with Ball  - 1 x daily - 7 x weekly - 1-2 sets - 10 reps - 5 hold - Standing Single Leg Stance with Unilateral Counter Support  - 1 x daily - 7 x weekly - 1-2 sets - 5 reps - 20 hold - Hooklying Hamstring Stretch with Strap  - 1 x daily - 7 x weekly - 1 sets - 3 reps - 20 hold - Supine Piriformis Stretch with Foot on Ground  - 1 x daily - 7 x weekly - 1 sets - 3 reps - 20 hold   ASSESSMENT:   CLINICAL IMPRESSION: PT was completed for STM/DTM to the L gluteal musculature f/b therex for lumbopelvic flexibility and  strengthening. Pt's HEP was updated. Pt  returned proper demonstration. Pt reports consistent completion of her HEP. Overall, pt's L hip pain is improving. If pt continues to respond positively, she will cancel next week's appt, and then return to PT in approx 2 weeks.Pt will continue to benefit from skilled PT to address impairments for LE improved function with less pain.   OBJECTIVE IMPAIRMENTS: decreased activity tolerance, decreased ROM, increased muscle spasms, postural dysfunction, and obesity.    ACTIVITY LIMITATIONS: lifting, standing, and sleeping   PARTICIPATION LIMITATIONS: yard work and exercise   PERSONAL FACTORS: Past/current experiences and Time since onset of injury/illness/exacerbation are also affecting patient's functional outcome.    REHAB POTENTIAL: Good     GOALS:   SHORT TERM GOALS=LTGs   Prothero TERM GOALS: Target date: 10/01/22   Pt will be Ind in a final HEP to maintain achieved LOF  Baseline: initiated Goal status: Ongoing   2.  Pt will voice understanding of measures to assist in pain reduction  Baseline: initiated Goal status: Ongoing   3.  Increase L hip strength to 4+ or greater strength for improvd functional ability with less pain Baseline: See flow sheets Goal status: INITIAL   4.  Pt's FOTO score will improved to the predicted value of 72% as indication of improved function  Baseline: 49% Goal status: INITIAL   PLAN:   PT FREQUENCY: 1x/week   PT DURATION: 6 weeks   PLANNED INTERVENTIONS: Therapeutic exercises, Therapeutic activity, Balance training, Gait training, Patient/Family education, Self Care, Joint mobilization, Dry Needling, Electrical stimulation, Spinal manipulation, Spinal mobilization, Cryotherapy, Moist heat, Taping, Ultrasound, Ionotophoresis '4mg'$ /ml Dexamethasone, Manual therapy, and Re-evaluation.   PLAN FOR NEXT SESSION: Review FOTO; assess response to HEP; progress therex as indicated; use of modalities, manual therapy; and  TPDN as indicated.  Lavonn Maxcy MS, PT 08/25/22 1:38 PM

## 2022-09-02 ENCOUNTER — Ambulatory Visit: Payer: PPO

## 2022-09-09 ENCOUNTER — Ambulatory Visit: Payer: PPO

## 2022-09-09 DIAGNOSIS — R252 Cramp and spasm: Secondary | ICD-10-CM

## 2022-09-09 DIAGNOSIS — M6281 Muscle weakness (generalized): Secondary | ICD-10-CM

## 2022-09-09 DIAGNOSIS — M5442 Lumbago with sciatica, left side: Secondary | ICD-10-CM

## 2022-09-09 NOTE — Therapy (Signed)
OUTPATIENT PHYSICAL THERAPY TREATMENT NOTE   Patient Name: Mary Ward MRN: [id number] DOB:[birth year], 76 y.o., female Today's Date: 09/09/2022  PCP: Biagio Borg, MD  REFERRING PROVIDER: Biagio Borg, MD   END OF SESSION:   PT End of Session - 09/09/22 1305     Visit Number 3    Number of Visits 7    Date for PT Re-Evaluation 10/01/22    Authorization Type [employer]    PT Start Time 0932    PT Stop Time 1230    PT Time Calculation (min) 45 min    Activity Tolerance Patient tolerated treatment well    Behavior During Therapy WFL for tasks assessed/performed             Past Medical History:  Diagnosis Date   Allergy    dust, dogs, grass   EN (erythema nodosum)    Hyperlipidemia    Hyperplastic colonic polyp    Hypertension    Hypothyroidism    Obesity    Past Surgical History:  Procedure Laterality Date   ABDOMINAL HYSTERECTOMY     BREAST REDUCTION SURGERY  2002   BUNIONECTOMY     both feet   COLONOSCOPY  01/22/2014   CYST EXCISION  12/15/12   punch biopsy left shoulder blade   HAMMER TOE SURGERY     right 2nd toe   LAPAROSCOPIC ASSISTED VAGINAL HYSTERECTOMY  1992   with BSO   OOPHORECTOMY     TONSILLECTOMY     Patient Active Problem List   Diagnosis Date Noted   Acquired deformity of lower leg 08/09/2022   B12 deficiency 08/09/2022   Left lumbar radiculopathy 04/24/2022   Osteopenia 04/24/2022   Abdominal pain 01/19/2022   Allergic rhinitis 11/05/2020   Acute diverticulitis 04/11/2020   Right renal mass 04/11/2020   Insomnia 01/12/2020   Chest pain 11/05/2017   Cough 12/18/2015   Coronary artery calcification seen on CAT scan 12/19/2014   Hyperglycemia 12/19/2014   Preventative health care 11/12/2011   MENOPAUSAL DISORDER 09/29/2009   ELECTROCARDIOGRAM, ABNORMAL 08/07/2008   GERD 08/06/2007   COLONIC POLYPS, HX OF 08/06/2007   Hypothyroidism 05/07/2007   Hyperlipidemia 05/07/2007   OBESITY 05/07/2007   Anxiety with  depression 05/07/2007   Essential hypertension 05/07/2007   Erythema nodosum 04/28/2007    REFERRING DIAG: M54.16 (ICD-10-CM) - Left lumbar radiculopathy    THERAPY DIAG:  Acute left-sided low back pain with left-sided sciatica  Muscle weakness (generalized)  Cramp and spasm  Rationale for Evaluation and Treatment Rehabilitation  SUBJECTIVE:  SUBJECTIVE STATEMENT: Pt reports she stopped taking her meloxicam 3 days ago. She notes last night being woke up with L thigh pain which resolved. Pt notes she exercised just before going to bed.   PAIN:  Are you having pain? Yes: NPRS scale: 1/10 L gluteal Pain location: L low back, gluteal and LE Pain description: ache Aggravating factors: prolonged standing still, at night  Relieving factors: exs, massage gun, advil, ice pack  PERTINENT HISTORY:  HTN   PRECAUTIONS: None   WEIGHT BEARING RESTRICTIONS: No   OCCUPATION: Retired, exs 3x per week, house and yard work   PATIENT GOALS: To be active with less pain, to have a HEP which can help limit her symptoms   NEXT MD VISIT:6 months   OBJECTIVE: (objective measures completed at initial evaluation unless otherwise dated)    DIAGNOSTIC FINDINGS:  04/27/22 Lumbar MRI IMPRESSION: Mild discogenic endplate edematous changes at L2-3 and L4-5 could relate to regional back pain.   Lumbar region degenerative disc disease and degenerative facet disease which could in general relate to regional pain.   Foraminal narrowing at L4-5 left worse than right due to encroachment by osteophytes and disc material. In particular, the left L4 nerve could possibly be affected.   PATIENT SURVEYS:  FOTO: Perceived function   49%, predicted   72%    SCREENING FOR RED FLAGS: Bowel or bladder incontinence: No Spinal  tumors: No Cauda equina syndrome: No Compression fracture: No   COGNITION: Overall cognitive status: Within functional limits for tasks assessed                          SENSATION: WFL   MUSCLE LENGTH: Hamstrings: Right tight ; Left tight deg Thomas test: Right NT deg; Left NT deg   POSTURE: rounded shoulders, forward head, decreased lumbar lordosis, and increased thoracic kyphosis   PALPATION: TTP L gluteal area increased muscle tightness   LUMBAR ROM:    AROM eval  Flexion WNLs  pull both hanstrings  Extension WNLs mild pressure  Right lateral flexion WNLs Min stretch L low back  Left lateral flexion WNLs Mins stretch R Low back  Right rotation WNLs  Left rotation WNLs   (Blank rows = not tested)   LOWER EXTREMITY ROM:      Passive  Right eval Left eval  Hip flexion      Hip extension      Hip abduction      Hip adduction      Hip internal rotation Decreased Decreased c pain  Hip external rotation      Knee flexion      Knee extension      Ankle dorsiflexion      Ankle plantarflexion      Ankle inversion      Ankle eversion       (Blank rows = not tested)   LOWER EXTREMITY MMT:     MMT Right eval Left eval  Hip flexion 5 4+  Hip extension 4+ 4  Hip abduction 4+ 4  Hip adduction      Hip internal rotation      Hip external rotation 4+ 4  Knee flexion 5 5  Knee extension 5 5  Ankle dorsiflexion      Ankle plantarflexion      Ankle inversion      Ankle eversion       (Blank rows = not tested)   LUMBAR SPECIAL TESTS:  Straight leg  raise test: Negative, Slump test: Negative, SI Compression/distraction test: Negative, and FABER test: Negative   FUNCTIONAL TESTS:  NT   GAIT: Distance walked: 200' Assistive device utilized: None Level of assistance: Complete Independence Comments: WNLs   TODAY'S TREATMENT: [facility] Adult PT Treatment:                                                DATE: 09/09/22 Therapeutic Exercise: NuStep 59mn L3, UE/LE  lowlevel L knee pain which resolved Supine Bridge 10 reps - 5 hold Hooklying Clamshell with BluTB 10 reps - 5 hold Supine Hip Adduction Isometric with Ball 10 reps - 5 hold SL hip abd x10 Hamstring stretch x1 20" each Piriformis stretch x1 20" each Figure 4 stretch x1 20" each SLS x3 30' each Updated HEP  [facility] Adult PT Treatment:                                                DATE: 08/25/22 Therapeutic Exercise: Supine Bridge 10 reps - 5 hold Hooklying Clamshell with BluTB 10 reps - 5 hold Supine Hip Adduction Isometric with Ball 10 reps - 5 hold Hamstring stretch x2 20" each Piriformis stretch x2 20" each SLS x3 30' each Updated HEP   Manual Therapy: STM/DTM to the L gluteal musculature                                                                                                                         [facility] Adult PT Treatment:                                                DATE: 08/19/22 Therapeutic Exercise: Developed, instructed pt, and pt completed therex as noted in HEP   Self Care: Tennis ball massage, cold pack, sleeping positions with supoort for comfort     PATIENT EDUCATION:  Education details: Eval findings, POC, HEP, self care  Person educated: Patient Education method: Explanation, Demonstration, Tactile cues, Verbal cues, and Handouts Education comprehension: verbalized understanding, returned demonstration, verbal cues required, and tactile cues required   HOME EXERCISE PROGRAM: Access Code: [email or web address] URL: [email or web address] Date: 09/09/2022 Prepared by: AGar Ponto Exercises - Supine Bridge  - 1 x daily - 7 x weekly - 1-2 sets - 10 reps - 5 hold - Hooklying Clamshell with Resistance  - 1 x daily - 7 x weekly - 1-2 sets - 10 reps - 5 hold - Supine Hip Adduction Isometric with Ball  - 1 x daily - 7 x weekly - 1-2 sets - 10 reps - 5 hold -  Sidelying Hip Abduction  - 1 x daily - 7 x weekly - 3 sets - 10 reps - Hooklying Hamstring Stretch  with Strap  - 1 x daily - 7 x weekly - 1 sets - 3 reps - 20 hold - Supine ITB Stretch with Strap (Mirrored)  - 1 x daily - 7 x weekly - 1 sets - 3 reps - 20 hold - Supine Piriformis Stretch with Foot on Ground  - 1 x daily - 7 x weekly - 1 sets - 3 reps - 20 hold - Supine Figure 4 Piriformis Stretch  - 1 x daily - 7 x weekly - 1 sets - 3 reps - 20 hold - Standing Single Leg Stance with Unilateral Counter Support  - 1 x daily - 7 x weekly - 1-2 sets - 5 reps - 20 hold   ASSESSMENT:   CLINICAL IMPRESSION: PT was completed for therex for lumbopelvic flexibility and strengthening. Stretches for the L ITB and hip flexors and a strengthening ex for the L abductors were added to the pt's therex and HEP. Pt completed  proper demonstration. Pt is having intermittent pain at a low level. Pt stopped taking meloxicam 3 days ago. Pt tolerated PT today without adverse effects. Pt is to continue with her HEP, but not just before bedtime.     OBJECTIVE IMPAIRMENTS: decreased activity tolerance, decreased ROM, increased muscle spasms, postural dysfunction, and obesity.    ACTIVITY LIMITATIONS: lifting, standing, and sleeping   PARTICIPATION LIMITATIONS: yard work and exercise   PERSONAL FACTORS: Past/current experiences and Time since onset of injury/illness/exacerbation are also affecting patient's functional outcome.    REHAB POTENTIAL: Good     GOALS:   SHORT TERM GOALS=LTGs   Clock TERM GOALS: Target date: 10/01/22   Pt will be Ind in a final HEP to maintain achieved LOF  Baseline: initiated Goal status: MET initially   2.  Pt will voice understanding of measures to assist in pain reduction  Baseline: initiated Goal status: Ongoing   3.  Increase L hip strength to 4+ or greater strength for improvd functional ability with less pain Baseline: See flow sheets Goal status: INITIAL   4.  Pt's FOTO score will improved to the predicted value of 72% as indication of improved function  Baseline:  49% Goal status: INITIAL   PLAN:   PT FREQUENCY: 1x/week   PT DURATION: 6 weeks   PLANNED INTERVENTIONS: Therapeutic exercises, Therapeutic activity, Balance training, Gait training, Patient/Family education, Self Care, Joint mobilization, Dry Needling, Electrical stimulation, Spinal manipulation, Spinal mobilization, Cryotherapy, Moist heat, Taping, Ultrasound, Ionotophoresis 73m/ml Dexamethasone, Manual therapy, and Re-evaluation.   PLAN FOR NEXT SESSION: Review FOTO; assess response to HEP; progress therex as indicated; use of modalities, manual therapy; and TPDN as indicated.  Allen Ralls MS, PT 09/09/22 1:20 PM

## 2022-09-16 ENCOUNTER — Ambulatory Visit: Payer: PPO | Attending: Internal Medicine

## 2022-09-16 DIAGNOSIS — R252 Cramp and spasm: Secondary | ICD-10-CM | POA: Insufficient documentation

## 2022-09-16 DIAGNOSIS — M6281 Muscle weakness (generalized): Secondary | ICD-10-CM | POA: Insufficient documentation

## 2022-09-16 DIAGNOSIS — M5442 Lumbago with sciatica, left side: Secondary | ICD-10-CM | POA: Insufficient documentation

## 2022-09-16 NOTE — Therapy (Signed)
OUTPATIENT PHYSICAL THERAPY TREATMENT NOTE   Patient Name: Mary Ward MRN: [id number] DOB:[name] 27, 1946, 77 y.o., female Today's Date: 09/16/2022  PCP: Biagio Borg, MD  REFERRING PROVIDER: Biagio Borg, MD   END OF SESSION:   PT End of Session - 09/16/22 1300     Visit Number 4    Number of Visits 7    Date for PT Re-Evaluation 10/01/22    Authorization Type [employer]    PT Start Time 1151    PT Stop Time 1232    PT Time Calculation (min) 41 min    Activity Tolerance Patient tolerated treatment well    Behavior During Therapy WFL for tasks assessed/performed             Past Medical History:  Diagnosis Date   Allergy    dust, dogs, grass   EN (erythema nodosum)    Hyperlipidemia    Hyperplastic colonic polyp    Hypertension    Hypothyroidism    Obesity    Past Surgical History:  Procedure Laterality Date   ABDOMINAL HYSTERECTOMY     BREAST REDUCTION SURGERY  2002   BUNIONECTOMY     both feet   COLONOSCOPY  01/22/2014   CYST EXCISION  12/15/12   punch biopsy left shoulder blade   HAMMER TOE SURGERY     right 2nd toe   LAPAROSCOPIC ASSISTED VAGINAL HYSTERECTOMY  1992   with BSO   OOPHORECTOMY     TONSILLECTOMY     Patient Active Problem List   Diagnosis Date Noted   Acquired deformity of lower leg 08/09/2022   B12 deficiency 08/09/2022   Left lumbar radiculopathy 04/24/2022   Osteopenia 04/24/2022   Abdominal pain 01/19/2022   Allergic rhinitis 11/05/2020   Acute diverticulitis 04/11/2020   Right renal mass 04/11/2020   Insomnia 01/12/2020   Chest pain 11/05/2017   Cough 12/18/2015   Coronary artery calcification seen on CAT scan 12/19/2014   Hyperglycemia 12/19/2014   Preventative health care 11/12/2011   MENOPAUSAL DISORDER 09/29/2009   ELECTROCARDIOGRAM, ABNORMAL 08/07/2008   GERD 08/06/2007   COLONIC POLYPS, HX OF 08/06/2007   Hypothyroidism 05/07/2007   Hyperlipidemia 05/07/2007   OBESITY 05/07/2007   Anxiety with  depression 05/07/2007   Essential hypertension 05/07/2007   Erythema nodosum 04/28/2007    REFERRING DIAG: M54.16 (ICD-10-CM) - Left lumbar radiculopathy    THERAPY DIAG:  Acute left-sided low back pain with left-sided sciatica  Muscle weakness (generalized)  Cramp and spasm  Rationale for Evaluation and Treatment Rehabilitation  SUBJECTIVE:                                                                                                                                                                                         .  SUBJECTIVE STATEMENT: Doing much better. Able to sleep on both sides now. L lateral knee has been more sore c the new ITB stretch.   PAIN:  Are you having pain? Yes: NPRS scale: 0/10 L gluteal, 0-1/10 intermittent Pain location: L low back, gluteal and LE Pain description: ache Aggravating factors: prolonged standing still, at night  Relieving factors: exs, massage gun, advil, ice pack  PERTINENT HISTORY:  HTN   PRECAUTIONS: None   WEIGHT BEARING RESTRICTIONS: No   OCCUPATION: Retired, exs 3x per week, house and yard work   PATIENT GOALS: To be active with less pain, to have a HEP which can help limit her symptoms   NEXT MD VISIT:6 months   OBJECTIVE: (objective measures completed at initial evaluation unless otherwise dated)    DIAGNOSTIC FINDINGS:  04/27/22 Lumbar MRI IMPRESSION: Mild discogenic endplate edematous changes at L2-3 and L4-5 could relate to regional back pain.   Lumbar region degenerative disc disease and degenerative facet disease which could in general relate to regional pain.   Foraminal narrowing at L4-5 left worse than right due to encroachment by osteophytes and disc material. In particular, the left L4 nerve could possibly be affected.   PATIENT SURVEYS:  FOTO: Perceived function   49%, predicted   72%    SCREENING FOR RED FLAGS: Bowel or bladder incontinence: No Spinal tumors: No Cauda equina syndrome:  No Compression fracture: No   COGNITION: Overall cognitive status: Within functional limits for tasks assessed                          SENSATION: WFL   MUSCLE LENGTH: Hamstrings: Right tight ; Left tight deg Thomas test: Right NT deg; Left NT deg   POSTURE: rounded shoulders, forward head, decreased lumbar lordosis, and increased thoracic kyphosis   PALPATION: TTP L gluteal area increased muscle tightness   LUMBAR ROM:    AROM eval  Flexion WNLs  pull both hanstrings  Extension WNLs mild pressure  Right lateral flexion WNLs Min stretch L low back  Left lateral flexion WNLs Mins stretch R Low back  Right rotation WNLs  Left rotation WNLs   (Blank rows = not tested)   LOWER EXTREMITY ROM:      Passive  Right eval Left eval  Hip flexion      Hip extension      Hip abduction      Hip adduction      Hip internal rotation Decreased Decreased c pain  Hip external rotation      Knee flexion      Knee extension      Ankle dorsiflexion      Ankle plantarflexion      Ankle inversion      Ankle eversion       (Blank rows = not tested)   LOWER EXTREMITY MMT:     MMT Right eval Left eval  Hip flexion 5 4+  Hip extension 4+ 4  Hip abduction 4+ 4  Hip adduction      Hip internal rotation      Hip external rotation 4+ 4  Knee flexion 5 5  Knee extension 5 5  Ankle dorsiflexion      Ankle plantarflexion      Ankle inversion      Ankle eversion       (Blank rows = not tested)   LUMBAR SPECIAL TESTS:  Straight leg raise test: Negative, Slump test: Negative, SI Compression/distraction  test: Negative, and FABER test: Negative   FUNCTIONAL TESTS:  NT   GAIT: Distance walked: 200' Assistive device utilized: None Level of assistance: Complete Independence Comments: WNLs   TODAY'S TREATMENT: [facility] Adult PT Treatment:                                                DATE: 09/16/21 Therapeutic Exercise: STS x5, increased L knee pain and stopped after 5 reps Supine  Bridge 10 reps - 5 hold PPT x10 3" Ab bracing 90/90 x5 10" Hamstring stretch x1 20" each Piriformis stretch x1 20" each Figure 4 stretch x1 20" each Banded side steps 7f each way x2 SL RDL silding hand on counter x10 each  [facility]                                                [facility]: [facility] Therapeutic Exercise: NuStep 36m L3, UE/LE lowlevel L knee pain which resolved Supine Bridge 10 reps - 5 hold Hooklying Clamshell with BluTB 10 reps - 5 hold Supine Hip Adduction Isometric with Ball 10 reps - 5 hold SL hip abd x10 Hamstring stretch x1 20" each Piriformis stretch x1 20" each Figure 4 stretch x1 20" each SLS x3 30' each Updated HEP  [facility] Adult PT Treatment:                                                DATE: 08/25/22 Therapeutic Exercise: Supine Bridge 10 reps - 5 hold Hooklying Clamshell with BluTB 10 reps - 5 hold Supine Hip Adduction Isometric with Ball 10 reps - 5 hold Hamstring stretch x2 20" each Piriformis stretch x2 20" each SLS x3 30' each Updated HEP   Manual Therapy: STM/DTM to the L gluteal musculature                                                                                                                 PATIENT EDUCATION:  Education details: Eval findings, POC, HEP, self care  Person educated: Patient Education method: Explanation, Demonstration, Tactile cues, Verbal cues, and Handouts Education comprehension: verbalized understanding, returned demonstration, verbal cues required, and tactile cues required   HOME EXERCISE PROGRAM: Access Code: [email or web address] URL: [email or web address] Date: 09/09/2022 Prepared by: AlGar PontoExercises - Supine Bridge  - 1 x daily - 7 x weekly - 1-2 sets - 10 reps - 5 hold - Hooklying Clamshell with Resistance  - 1 x daily - 7 x weekly - 1-2 sets - 10 reps - 5 hold - Supine Hip Adduction Isometric with Ball  - 1 x daily - 7  x weekly - 1-2 sets - 10 reps - 5 hold - Sidelying Hip  Abduction  - 1 x daily - 7 x weekly - 3 sets - 10 reps - Hooklying Hamstring Stretch with Strap  - 1 x daily - 7 x weekly - 1 sets - 3 reps - 20 hold - Supine ITB Stretch with Strap (Mirrored)  - 1 x daily - 7 x weekly - 1 sets - 3 reps - 20 hold - Supine Piriformis Stretch with Foot on Ground  - 1 x daily - 7 x weekly - 1 sets - 3 reps - 20 hold - Supine Figure 4 Piriformis Stretch  - 1 x daily - 7 x weekly - 1 sets - 3 reps - 20 hold - Standing Single Leg Stance with Unilateral Counter Support  - 1 x daily - 7 x weekly - 1-2 sets - 5 reps - 20 hold   ASSESSMENT:   CLINICAL IMPRESSION: PT was completed for lumbopelvic flexibility and strengthening with emphasis on core strengthening and hip strengthening in the closed kinetic chain. The ITB stretch started last week seemed to aggravated the lateral L knee and was Dced. Subjective reports indicates improvement in her L hip pain which the pt is pleased about. Pt tolerated PT today without adverse effects. Pt will continue to benefit from skilled PT to address impairments for improved function.   ACTIVITY LIMITATIONS: lifting, standing, and sleeping   PARTICIPATION LIMITATIONS: yard work and exercise   PERSONAL FACTORS: Past/current experiences and Time since onset of injury/illness/exacerbation are also affecting patient's functional outcome.    REHAB POTENTIAL: Good     GOALS:   SHORT TERM GOALS=LTGs   [place] TERM GOALS: Target date: 10/01/22   Pt will be Ind in a final HEP to maintain achieved LOF  Baseline: initiated Goal status: MET initially   2.  Pt will voice understanding of measures to assist in pain reduction  Baseline: initiated Goal status: Ongoing   3.  Increase L hip strength to 4+ or greater strength for improvd functional ability with less pain Baseline: See flow sheets Goal status: INITIAL   4.  Pt's FOTO score will improved to the predicted value of 72% as indication of improved function  Baseline: 49% Goal  status: INITIAL   PLAN:   PT FREQUENCY: 1x/week   PT DURATION: 6 weeks   PLANNED INTERVENTIONS: Therapeutic exercises, Therapeutic activity, Balance training, Gait training, Patient/Family education, Self Care, Joint mobilization, Dry Needling, Electrical stimulation, Spinal manipulation, Spinal mobilization, Cryotherapy, Moist heat, Taping, Ultrasound, Ionotophoresis 16m/ml Dexamethasone, Manual therapy, and Re-evaluation.   PLAN FOR NEXT SESSION: Review FOTO; assess response to HEP; progress therex as indicated; use of modalities, manual therapy; and TPDN as indicated. Reassess hip strength and FOTO.  Teryl Gubler MS, PT 09/16/22 1:15 PM

## 2022-09-22 NOTE — Therapy (Signed)
OUTPATIENT PHYSICAL THERAPY TREATMENT NOTE/Discharge   Patient Name: Mary Ward MRN: [id number] DOB:11-05-1945, 77 y.o., female Today's Date: 09/23/2022  PCP: Biagio Borg, MD  REFERRING PROVIDER: Biagio Borg, MD   END OF SESSION:   PT End of Session - 09/23/22 1107     Visit Number 5    Number of Visits 7    Date for PT Re-Evaluation 10/01/22    Authorization Type [employer]    PT Start Time 1110    PT Stop Time 1145    PT Time Calculation (min) 35 min    Activity Tolerance Patient tolerated treatment well    Behavior During Therapy WFL for tasks assessed/performed             Past Medical History:  Diagnosis Date   Allergy    dust, dogs, grass   EN (erythema nodosum)    Hyperlipidemia    Hyperplastic colonic polyp    Hypertension    Hypothyroidism    Obesity    Past Surgical History:  Procedure Laterality Date   ABDOMINAL HYSTERECTOMY     BREAST REDUCTION SURGERY  2002   BUNIONECTOMY     both feet   COLONOSCOPY  01/22/2014   CYST EXCISION  12/15/12   punch biopsy left shoulder blade   HAMMER TOE SURGERY     right 2nd toe   LAPAROSCOPIC ASSISTED VAGINAL HYSTERECTOMY  1992   with BSO   OOPHORECTOMY     TONSILLECTOMY     Patient Active Problem List   Diagnosis Date Noted   Acquired deformity of lower leg 08/09/2022   B12 deficiency 08/09/2022   Left lumbar radiculopathy 04/24/2022   Osteopenia 04/24/2022   Abdominal pain 01/19/2022   Allergic rhinitis 11/05/2020   Acute diverticulitis 04/11/2020   Right renal mass 04/11/2020   Insomnia 01/12/2020   Chest pain 11/05/2017   Cough 12/18/2015   Coronary artery calcification seen on CAT scan 12/19/2014   Hyperglycemia 12/19/2014   Preventative health care 11/12/2011   MENOPAUSAL DISORDER 09/29/2009   ELECTROCARDIOGRAM, ABNORMAL 08/07/2008   GERD 08/06/2007   COLONIC POLYPS, HX OF 08/06/2007   Hypothyroidism 05/07/2007   Hyperlipidemia 05/07/2007   OBESITY 05/07/2007   Anxiety  with depression 05/07/2007   Essential hypertension 05/07/2007   Erythema nodosum 04/28/2007    REFERRING DIAG: M54.16 (ICD-10-CM) - Left lumbar radiculopathy    THERAPY DIAG:  Acute left-sided low back pain with left-sided sciatica  Muscle weakness (generalized)  Cramp and spasm  Rationale for Evaluation and Treatment Rehabilitation  SUBJECTIVE:                                                                                                                                                                                         .  SUBJECTIVE STATEMENT: Pt reports having pain free days, but this weekend after prolonged standing for 4+ hours while cooking, her L hip started hurting to about 7/10. Pt notes the pain decrease with HEP and OTC meds over a few days.  PAIN:  Are you having pain? Yes: NPRS scale: 0/10 L gluteal, 0-2/10 intermittent Pain location: L low back, gluteal and LE Pain description: ache Aggravating factors: prolonged standing still, at night  Relieving factors: exs, massage gun, advil, ice pack  PERTINENT HISTORY:  HTN   PRECAUTIONS: None   WEIGHT BEARING RESTRICTIONS: No   OCCUPATION: Retired, exs 3x per week, house and yard work   PATIENT GOALS: To be active with less pain, to have a HEP which can help limit her symptoms   NEXT MD VISIT:6 months   OBJECTIVE: (objective measures completed at initial evaluation unless otherwise dated)    DIAGNOSTIC FINDINGS:  04/27/22 Lumbar MRI IMPRESSION: Mild discogenic endplate edematous changes at L2-3 and L4-5 could relate to regional back pain.   Lumbar region degenerative disc disease and degenerative facet disease which could in general relate to regional pain.   Foraminal narrowing at L4-5 left worse than right due to encroachment by osteophytes and disc material. In particular, the left L4 nerve could possibly be affected.   PATIENT SURVEYS:  FOTO: Perceived function   49%, predicted   72%     SCREENING FOR RED FLAGS: Bowel or bladder incontinence: No Spinal tumors: No Cauda equina syndrome: No Compression fracture: No   COGNITION: Overall cognitive status: Within functional limits for tasks assessed                          SENSATION: WFL   MUSCLE LENGTH: Hamstrings: Right tight ; Left tight deg Thomas test: Right NT deg; Left NT deg   POSTURE: rounded shoulders, forward head, decreased lumbar lordosis, and increased thoracic kyphosis   PALPATION: TTP L gluteal area increased muscle tightness   LUMBAR ROM:    AROM eval  Flexion WNLs  pull both hanstrings  Extension WNLs mild pressure  Right lateral flexion WNLs Min stretch L low back  Left lateral flexion WNLs Mins stretch R Low back  Right rotation WNLs  Left rotation WNLs   (Blank rows = not tested)   LOWER EXTREMITY ROM:      Passive  Right eval Left eval  Hip flexion      Hip extension      Hip abduction      Hip adduction      Hip internal rotation Decreased Decreased c pain  Hip external rotation      Knee flexion      Knee extension      Ankle dorsiflexion      Ankle plantarflexion      Ankle inversion      Ankle eversion       (Blank rows = not tested)   LOWER EXTREMITY MMT:     MMT Right eval Left eval LT 09/23/22  Hip flexion 5 4+ 4+  Hip extension 4+ 4 4+  Hip abduction 4+ 4 4+  Hip adduction       Hip internal rotation       Hip external rotation 4+ 4 %  Knee flexion 5 5   Knee extension 5 5   Ankle dorsiflexion       Ankle plantarflexion       Ankle inversion  Ankle eversion        (Blank rows = not tested)   LUMBAR SPECIAL TESTS:  Straight leg raise test: Negative, Slump test: Negative, SI Compression/distraction test: Negative, and FABER test: Negative   FUNCTIONAL TESTS:  NT   GAIT: Distance walked: 200' Assistive device utilized: None Level of assistance: Complete Independence Comments: WNLs   TODAY'S TREATMENT: [facility] Adult PT Treatment:                                                 DATE: 09/23/22 Therapeutic Activity: FOTO- 82% Self Care: Spent significant time with pt discussing her symptoms, aggravating and relieving causes, and realistic expectations re: pain as it relates to activity and aging.  [facility] Adult PT Treatment:                                                DATE: 09/16/21 Therapeutic Exercise: STS x5, increased L knee pain and stopped after 5 reps Supine Bridge 10 reps - 5 hold PPT x10 3" Ab bracing 90/90 x5 10" Hamstring stretch x1 20" each Piriformis stretch x1 20" each Figure 4 stretch x1 20" each Banded side steps 64f each way x2 SL RDL silding hand on counter x10 each  [facility]                                                [facility]: [facility] Therapeutic Exercise: NuStep 336m L3, UE/LE lowlevel L knee pain which resolved Supine Bridge 10 reps - 5 hold Hooklying Clamshell with BluTB 10 reps - 5 hold Supine Hip Adduction Isometric with Ball 10 reps - 5 hold SL hip abd x10 Hamstring stretch x1 20" each Piriformis stretch x1 20" each Figure 4 stretch x1 20" each SLS x3 30' each Updated HEP  [facility] Adult PT Treatment:                                                DATE: 08/25/22 Therapeutic Exercise: Supine Bridge 10 reps - 5 hold Hooklying Clamshell with BluTB 10 reps - 5 hold Supine Hip Adduction Isometric with Ball 10 reps - 5 hold Hamstring stretch x2 20" each Piriformis stretch x2 20" each SLS x3 30' each Updated HEP   Manual Therapy: STM/DTM to the L gluteal musculature  PATIENT EDUCATION:  Education details: Eval findings, POC, HEP, self care  Person educated: Patient Education method: Explanation, Demonstration, Tactile cues, Verbal cues, and Handouts Education comprehension: verbalized understanding, returned demonstration, verbal cues required, and tactile cues required    HOME EXERCISE PROGRAM: Access Code: [email or web address] URL: [email or web address] Date: 09/09/2022 Prepared by: Gar Ponto  Exercises - Supine Bridge  - 1 x daily - 7 x weekly - 1-2 sets - 10 reps - 5 hold - Hooklying Clamshell with Resistance  - 1 x daily - 7 x weekly - 1-2 sets - 10 reps - 5 hold - Supine Hip Adduction Isometric with Ball  - 1 x daily - 7 x weekly - 1-2 sets - 10 reps - 5 hold - Sidelying Hip Abduction  - 1 x daily - 7 x weekly - 3 sets - 10 reps - Hooklying Hamstring Stretch with Strap  - 1 x daily - 7 x weekly - 1 sets - 3 reps - 20 hold - Supine ITB Stretch with Strap (Mirrored)  - 1 x daily - 7 x weekly - 1 sets - 3 reps - 20 hold - Supine Piriformis Stretch with Foot on Ground  - 1 x daily - 7 x weekly - 1 sets - 3 reps - 20 hold - Supine Figure 4 Piriformis Stretch  - 1 x daily - 7 x weekly - 1 sets - 3 reps - 20 hold - Standing Single Leg Stance with Unilateral Counter Support  - 1 x daily - 7 x weekly - 1-2 sets - 5 reps - 20 hold   ASSESSMENT:   CLINICAL IMPRESSION: Pt completed her course of PT today. Pt has made good progress re: pain, hip strength, and function. Pt demonstrates appropriate understanding of her HEP and measures to assist with pain management. Pt demonstrates better understanding of pain and what to anticipate with activity and aging. Pt's FOTO was reassessed, and pt's perceived level of function significant improved from 49% to 82%. Pt is Dced with all PT goals met.   ACTIVITY LIMITATIONS: lifting, standing, and sleeping   PARTICIPATION LIMITATIONS: yard work and exercise   PERSONAL FACTORS: Past/current experiences and Time since onset of injury/illness/exacerbation are also affecting patient's functional outcome.    REHAB POTENTIAL: Good     GOALS:   SHORT TERM GOALS=LTGs   [place] TERM GOALS: Target date: 10/01/22   Pt will be Ind in a final HEP to maintain achieved LOF  Baseline: initiated Status: 09/23/22 Goal status:  MET  2.  Pt will voice understanding of measures to assist in pain reduction  Baseline: initiated Goal status: MET   3.  Increase L hip strength to 4+ or greater strength for improvd functional ability with less pain Baseline: See flow sheets Status: see flow sheet Goal status: MET   4.  Pt's FOTO score will improved to the predicted value of 72% as indication of improved function  Baseline: 49% Status: 82% Goal status: MET   PLAN:   PT FREQUENCY: 1x/week   PT DURATION: 6 weeks   PLANNED INTERVENTIONS: Therapeutic exercises, Therapeutic activity, Balance training, Gait training, Patient/Family education, Self Care, Joint mobilization, Dry Needling, Electrical stimulation, Spinal manipulation, Spinal mobilization, Cryotherapy, Moist heat, Taping, Ultrasound, Ionotophoresis '4mg'$ /ml Dexamethasone, Manual therapy, and Re-evaluation.   PLAN FOR NEXT SESSION:   PHYSICAL THERAPY DISCHARGE SUMMARY  Visits from Start of Care: 5  Current functional level related to goals / functional outcomes: See clinical impression and PT goals    Remaining deficits:  See clinical impression and PT goals    Education / Equipment: HEP   Patient agrees to discharge. Patient goals were met. Patient is being discharged due to being pleased with the current functional level.   Harmoni Lucus MS, PT 09/23/22 1:24 PM

## 2022-09-23 ENCOUNTER — Ambulatory Visit: Payer: PPO

## 2022-09-23 DIAGNOSIS — M5442 Lumbago with sciatica, left side: Secondary | ICD-10-CM | POA: Diagnosis not present

## 2022-09-23 DIAGNOSIS — R252 Cramp and spasm: Secondary | ICD-10-CM

## 2022-09-23 DIAGNOSIS — M6281 Muscle weakness (generalized): Secondary | ICD-10-CM

## 2022-09-30 ENCOUNTER — Ambulatory Visit: Payer: PPO

## 2022-10-21 ENCOUNTER — Other Ambulatory Visit: Payer: Self-pay | Admitting: Internal Medicine

## 2022-10-21 ENCOUNTER — Other Ambulatory Visit (INDEPENDENT_AMBULATORY_CARE_PROVIDER_SITE_OTHER): Payer: PPO

## 2022-10-21 DIAGNOSIS — E785 Hyperlipidemia, unspecified: Secondary | ICD-10-CM

## 2022-10-21 DIAGNOSIS — E538 Deficiency of other specified B group vitamins: Secondary | ICD-10-CM | POA: Diagnosis not present

## 2022-10-21 DIAGNOSIS — R739 Hyperglycemia, unspecified: Secondary | ICD-10-CM

## 2022-10-21 DIAGNOSIS — E559 Vitamin D deficiency, unspecified: Secondary | ICD-10-CM | POA: Diagnosis not present

## 2022-10-21 LAB — LIPID PANEL
Cholesterol: 132 mg/dL (ref 0–200)
HDL: 46.3 mg/dL (ref >39.00)
LDL Cholesterol: 69 mg/dL (ref 0–99)
NonHDL: 86.05
Total CHOL/HDL Ratio: 3
Triglycerides: 84 mg/dL (ref 0.0–149.0)
VLDL: 16.8 mg/dL (ref 0.0–40.0)

## 2022-10-21 LAB — CBC WITH DIFFERENTIAL/PLATELET
Basophils Absolute: 0 10*3/uL (ref 0.0–0.1)
Basophils Relative: 0.5 % (ref 0.0–3.0)
Eosinophils Absolute: 0.1 10*3/uL (ref 0.0–0.7)
Eosinophils Relative: 2 % (ref 0.0–5.0)
HCT: 41.9 % (ref 36.0–46.0)
Hemoglobin: 13.9 g/dL (ref 12.0–15.0)
Lymphocytes Relative: 34 % (ref 12.0–46.0)
Lymphs Abs: 2.1 10*3/uL (ref 0.7–4.0)
MCHC: 33.3 g/dL (ref 30.0–36.0)
MCV: 84.8 fl (ref 78.0–100.0)
Monocytes Absolute: 0.5 10*3/uL (ref 0.1–1.0)
Monocytes Relative: 7.6 % (ref 3.0–12.0)
Neutro Abs: 3.5 10*3/uL (ref 1.4–7.7)
Neutrophils Relative %: 55.9 % (ref 43.0–77.0)
Platelets: 139 10*3/uL — ABNORMAL LOW (ref 150.0–400.0)
RBC: 4.94 Mil/uL (ref 3.87–5.11)
RDW: 12.8 % (ref 11.5–15.5)
WBC: 6.3 10*3/uL (ref 4.0–10.5)

## 2022-10-21 LAB — URINALYSIS, ROUTINE W REFLEX MICROSCOPIC
Bilirubin Urine: NEGATIVE
Hgb urine dipstick: NEGATIVE
Ketones, ur: NEGATIVE
Nitrite: NEGATIVE
Specific Gravity, Urine: 1.02 (ref 1.000–1.030)
Total Protein, Urine: NEGATIVE
Urine Glucose: NEGATIVE
Urobilinogen, UA: 0.2 (ref 0.0–1.0)
pH: 6 (ref 5.0–8.0)

## 2022-10-21 LAB — HEPATIC FUNCTION PANEL
ALT: 15 U/L (ref 0–35)
AST: 16 U/L (ref 0–37)
Albumin: 3.9 g/dL (ref 3.5–5.2)
Alkaline Phosphatase: 72 U/L (ref 39–117)
Bilirubin, Direct: 0.2 mg/dL (ref 0.0–0.3)
Total Bilirubin: 0.5 mg/dL (ref 0.2–1.2)
Total Protein: 6.4 g/dL (ref 6.0–8.3)

## 2022-10-21 LAB — TSH: TSH: 0.96 u[IU]/mL (ref 0.35–5.50)

## 2022-10-21 LAB — VITAMIN D 25 HYDROXY (VIT D DEFICIENCY, FRACTURES): VITD: 32.09 ng/mL (ref 30.00–100.00)

## 2022-10-21 LAB — BASIC METABOLIC PANEL
BUN: 15 mg/dL (ref 6–23)
CO2: 29 mEq/L (ref 19–32)
Calcium: 9.1 mg/dL (ref 8.4–10.5)
Chloride: 104 mEq/L (ref 96–112)
Creatinine, Ser: 0.78 mg/dL (ref 0.40–1.20)
GFR: 74.4 mL/min (ref >60.00)
Glucose, Bld: 90 mg/dL (ref 70–99)
Potassium: 3.8 mEq/L (ref 3.5–5.1)
Sodium: 141 mEq/L (ref 135–145)

## 2022-10-21 LAB — MICROALBUMIN / CREATININE URINE RATIO
Creatinine,U: 80 mg/dL
Microalb Creat Ratio: 0.9 mg/g (ref 0.0–30.0)
Microalb, Ur: <0.7 mg/dL (ref 0.0–1.9)

## 2022-10-21 LAB — HEMOGLOBIN A1C: Hgb A1c MFr Bld: 5.8 % (ref 4.6–6.5)

## 2022-10-21 LAB — VITAMIN B12: Vitamin B-12: 1128 pg/mL — ABNORMAL HIGH (ref 211–911)

## 2022-10-21 MED ORDER — CEPHALEXIN 500 MG PO CAPS: 500.0000 mg | ORAL_CAPSULE | Freq: Three times a day (TID) | ORAL | 0 refills | Status: DC

## 2022-11-02 ENCOUNTER — Telehealth: Payer: Self-pay

## 2022-11-02 MED ORDER — NIRMATRELVIR/RITONAVIR (PAXLOVID)TABLET: 3.0000 | ORAL_TABLET | Freq: Two times a day (BID) | ORAL | 0 refills | Status: AC

## 2022-11-02 MED ORDER — HYDROCODONE BIT-HOMATROP MBR 5-1.5 MG/5ML PO SOLN: 5.0000 mL | Freq: Four times a day (QID) | ORAL | 0 refills | Status: AC | PRN

## 2022-11-02 NOTE — Telephone Encounter (Signed)
Notified pt w/ MD response. Pt want to know does she need to stop any of her medicine with the paxlovid. She states her husband was told not to take his atorvastatin.Marland KitchenJohny Chess

## 2022-11-02 NOTE — Telephone Encounter (Signed)
Ok I sent paxlovid and cough medicine  I will do same for the husband Mary Ward [birth year]

## 2022-11-02 NOTE — Telephone Encounter (Signed)
Notified pt w/MD response.../lmb 

## 2022-11-02 NOTE — Telephone Encounter (Signed)
Pt tested POS for COVID this morning 11/02/2022. Pt states her sxs are Cough, fever last temp checked was 100.5F.   Pt sxs started 11/01/2022. Pts husband also tested POS for COVID 11/01/2022.

## 2022-11-02 NOTE — Addendum Note (Signed)
Addended by: Biagio Borg on: 11/02/2022 01:13 PM   Modules accepted: Orders

## 2022-11-02 NOTE — Addendum Note (Signed)
Addended by: Earnstine Regal on: 11/02/2022 01:36 PM   Modules accepted: Orders

## 2022-11-02 NOTE — Telephone Encounter (Signed)
Ok to hold the statin (lipitor) while taking the paxlovid

## 2022-11-04 ENCOUNTER — Encounter: Payer: Self-pay | Admitting: Internal Medicine

## 2022-12-22 DIAGNOSIS — M25562 Pain in left knee: Secondary | ICD-10-CM | POA: Diagnosis not present

## 2022-12-22 DIAGNOSIS — M25552 Pain in left hip: Secondary | ICD-10-CM | POA: Diagnosis not present

## 2022-12-30 DIAGNOSIS — R531 Weakness: Secondary | ICD-10-CM | POA: Diagnosis not present

## 2022-12-30 DIAGNOSIS — M222X2 Patellofemoral disorders, left knee: Secondary | ICD-10-CM | POA: Diagnosis not present

## 2023-01-05 DIAGNOSIS — M222X2 Patellofemoral disorders, left knee: Secondary | ICD-10-CM | POA: Diagnosis not present

## 2023-01-05 DIAGNOSIS — R531 Weakness: Secondary | ICD-10-CM | POA: Diagnosis not present

## 2023-01-10 DIAGNOSIS — M222X2 Patellofemoral disorders, left knee: Secondary | ICD-10-CM | POA: Diagnosis not present

## 2023-01-10 DIAGNOSIS — R531 Weakness: Secondary | ICD-10-CM | POA: Diagnosis not present

## 2023-01-17 DIAGNOSIS — R531 Weakness: Secondary | ICD-10-CM | POA: Diagnosis not present

## 2023-01-17 DIAGNOSIS — M222X2 Patellofemoral disorders, left knee: Secondary | ICD-10-CM | POA: Diagnosis not present

## 2023-01-19 DIAGNOSIS — D225 Melanocytic nevi of trunk: Secondary | ICD-10-CM | POA: Diagnosis not present

## 2023-01-19 DIAGNOSIS — L570 Actinic keratosis: Secondary | ICD-10-CM | POA: Diagnosis not present

## 2023-01-19 DIAGNOSIS — D1801 Hemangioma of skin and subcutaneous tissue: Secondary | ICD-10-CM | POA: Diagnosis not present

## 2023-01-19 DIAGNOSIS — M25552 Pain in left hip: Secondary | ICD-10-CM | POA: Diagnosis not present

## 2023-01-19 DIAGNOSIS — D2271 Melanocytic nevi of right lower limb, including hip: Secondary | ICD-10-CM | POA: Diagnosis not present

## 2023-01-19 DIAGNOSIS — D2261 Melanocytic nevi of right upper limb, including shoulder: Secondary | ICD-10-CM | POA: Diagnosis not present

## 2023-01-19 DIAGNOSIS — L821 Other seborrheic keratosis: Secondary | ICD-10-CM | POA: Diagnosis not present

## 2023-01-19 DIAGNOSIS — M25562 Pain in left knee: Secondary | ICD-10-CM | POA: Diagnosis not present

## 2023-01-24 DIAGNOSIS — R531 Weakness: Secondary | ICD-10-CM | POA: Diagnosis not present

## 2023-01-24 DIAGNOSIS — M222X2 Patellofemoral disorders, left knee: Secondary | ICD-10-CM | POA: Diagnosis not present

## 2023-01-25 ENCOUNTER — Other Ambulatory Visit: Payer: Self-pay

## 2023-01-25 ENCOUNTER — Other Ambulatory Visit: Payer: Self-pay | Admitting: Internal Medicine

## 2023-02-01 ENCOUNTER — Other Ambulatory Visit (INDEPENDENT_AMBULATORY_CARE_PROVIDER_SITE_OTHER): Payer: PPO

## 2023-02-01 DIAGNOSIS — R739 Hyperglycemia, unspecified: Secondary | ICD-10-CM | POA: Diagnosis not present

## 2023-02-01 DIAGNOSIS — E039 Hypothyroidism, unspecified: Secondary | ICD-10-CM | POA: Diagnosis not present

## 2023-02-01 DIAGNOSIS — E559 Vitamin D deficiency, unspecified: Secondary | ICD-10-CM

## 2023-02-01 DIAGNOSIS — E538 Deficiency of other specified B group vitamins: Secondary | ICD-10-CM

## 2023-02-01 DIAGNOSIS — I10 Essential (primary) hypertension: Secondary | ICD-10-CM

## 2023-02-01 DIAGNOSIS — R531 Weakness: Secondary | ICD-10-CM | POA: Diagnosis not present

## 2023-02-01 DIAGNOSIS — M222X2 Patellofemoral disorders, left knee: Secondary | ICD-10-CM | POA: Diagnosis not present

## 2023-02-01 LAB — BASIC METABOLIC PANEL
BUN: 25 mg/dL — ABNORMAL HIGH (ref 6–23)
CO2: 28 mEq/L (ref 19–32)
Calcium: 9.3 mg/dL (ref 8.4–10.5)
Chloride: 102 mEq/L (ref 96–112)
Creatinine, Ser: 0.85 mg/dL (ref 0.40–1.20)
GFR: 66.98 mL/min (ref >60.00)
Glucose, Bld: 94 mg/dL (ref 70–99)
Potassium: 4.5 mEq/L (ref 3.5–5.1)
Sodium: 138 mEq/L (ref 135–145)

## 2023-02-01 LAB — HEPATIC FUNCTION PANEL
ALT: 19 U/L (ref 0–35)
AST: 21 U/L (ref 0–37)
Albumin: 4 g/dL (ref 3.5–5.2)
Alkaline Phosphatase: 76 U/L (ref 39–117)
Bilirubin, Direct: 0.1 mg/dL (ref 0.0–0.3)
Total Bilirubin: 0.7 mg/dL (ref 0.2–1.2)
Total Protein: 6.7 g/dL (ref 6.0–8.3)

## 2023-02-01 LAB — TSH: TSH: 1.05 u[IU]/mL (ref 0.35–5.50)

## 2023-02-01 LAB — LIPID PANEL
Cholesterol: 145 mg/dL (ref 0–200)
HDL: 48.3 mg/dL (ref >39.00)
LDL Cholesterol: 83 mg/dL (ref 0–99)
NonHDL: 96.53
Total CHOL/HDL Ratio: 3
Triglycerides: 66 mg/dL (ref 0.0–149.0)
VLDL: 13.2 mg/dL (ref 0.0–40.0)

## 2023-02-01 LAB — VITAMIN B12: Vitamin B-12: 235 pg/mL (ref 211–911)

## 2023-02-01 LAB — VITAMIN D 25 HYDROXY (VIT D DEFICIENCY, FRACTURES): VITD: 42.43 ng/mL (ref 30.00–100.00)

## 2023-02-01 LAB — HEMOGLOBIN A1C: Hgb A1c MFr Bld: 5.8 % (ref 4.6–6.5)

## 2023-02-08 ENCOUNTER — Ambulatory Visit (INDEPENDENT_AMBULATORY_CARE_PROVIDER_SITE_OTHER): Payer: PPO | Admitting: Internal Medicine

## 2023-02-08 ENCOUNTER — Encounter: Payer: Self-pay | Admitting: Internal Medicine

## 2023-02-08 VITALS — BP 124/78 | HR 62 | Temp 98.6°F | Ht 66.0 in | Wt 174.0 lb

## 2023-02-08 DIAGNOSIS — G8929 Other chronic pain: Secondary | ICD-10-CM | POA: Diagnosis not present

## 2023-02-08 DIAGNOSIS — M9901 Segmental and somatic dysfunction of cervical region: Secondary | ICD-10-CM | POA: Diagnosis not present

## 2023-02-08 DIAGNOSIS — E039 Hypothyroidism, unspecified: Secondary | ICD-10-CM

## 2023-02-08 DIAGNOSIS — E538 Deficiency of other specified B group vitamins: Secondary | ICD-10-CM

## 2023-02-08 DIAGNOSIS — I10 Essential (primary) hypertension: Secondary | ICD-10-CM

## 2023-02-08 DIAGNOSIS — E785 Hyperlipidemia, unspecified: Secondary | ICD-10-CM

## 2023-02-08 DIAGNOSIS — G44201 Tension-type headache, unspecified, intractable: Secondary | ICD-10-CM | POA: Diagnosis not present

## 2023-02-08 DIAGNOSIS — Z0001 Encounter for general adult medical examination with abnormal findings: Secondary | ICD-10-CM | POA: Diagnosis not present

## 2023-02-08 DIAGNOSIS — E559 Vitamin D deficiency, unspecified: Secondary | ICD-10-CM

## 2023-02-08 DIAGNOSIS — M542 Cervicalgia: Secondary | ICD-10-CM | POA: Diagnosis not present

## 2023-02-08 DIAGNOSIS — R739 Hyperglycemia, unspecified: Secondary | ICD-10-CM

## 2023-02-08 DIAGNOSIS — M9900 Segmental and somatic dysfunction of head region: Secondary | ICD-10-CM | POA: Diagnosis not present

## 2023-02-08 DIAGNOSIS — M25562 Pain in left knee: Secondary | ICD-10-CM

## 2023-02-08 MED ORDER — SYNTHROID 125 MCG PO TABS: 125.0000 ug | ORAL_TABLET | Freq: Every day | ORAL | 3 refills | Status: DC

## 2023-02-08 MED ORDER — TELMISARTAN-HCTZ 40-12.5 MG PO TABS: 1.0000 | ORAL_TABLET | Freq: Every day | ORAL | 3 refills | Status: DC

## 2023-02-08 MED ORDER — ATORVASTATIN CALCIUM 40 MG PO TABS
40.0000 mg | ORAL_TABLET | Freq: Every day | ORAL | 3 refills | Status: DC
Start: 2023-02-08 — End: 2024-04-02

## 2023-02-08 NOTE — Patient Instructions (Signed)
Please consider seeing sports medicine on the first floor for second opinion about the left knee  Ok to take the OTC B12 1000 mcg on Mon- Wed- Fri only  Ok to start OTC Vit D 1000 mcg per day  Ok to increase the lipitor to 40 mg per day  Please continue all other medications as before, and refills have been done if requested.  Please have the pharmacy call with any other refills you may need.  Please continue your efforts at being more active, low cholesterol diet, and weight control.  You are otherwise up to date with prevention measures today.  Please keep your appointments with your specialists as you may have planned  Please make an Appointment to return in 6 months, or sooner if needed, also with Lab Appointment for testing done 3-5 days before at the FIRST FLOOR Lab (so this is for TWO appointments - please see the scheduling desk as you leave)

## 2023-02-08 NOTE — Progress Notes (Unsigned)
Patient ID: Mary Ward, female   DOB: May 01, 1946, 77 y.o.   MRN: [id number]         Chief Complaint:: wellness exam and Medical Management of Chronic Issues (6 month follow up , still having pain in left thigh been going on for 8 months)  , low thyroid, hd, htn, hyperglycemia, low b12, left knee pain       HPI:  Mary Ward is a 77 y.o. 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   Pt denies chest pain, increased sob or doe, wheezing, orthopnea, PND, increased LE swelling, palpitations, dizziness or syncope.   Pt denies polydipsia, polyuria, or new focal neuro s/s.    Pt denies fever, wt loss, night sweats, loss of appetite, or other constitutional symptoms  Denies hyper or hypo thyroid symptoms such as voice, skin or hair change.   Wt Readings from Last 3 Encounters:  02/08/23 174 lb (78.9 kg)  08/23/22 172 lb 9.6 oz (78.3 kg)  08/09/22 171 lb (77.6 kg)   BP Readings from Last 3 Encounters:  02/08/23 124/78  08/23/22 120/60  08/09/22 124/72   Immunization History  Administered Date(s) Administered   Fluad Quad(high Dose 65+) 06/23/2019, 07/02/2020, 06/26/2021, 06/21/2022   Influenza Whole 07/30/1999, 07/14/2006, 07/03/2007, 06/13/2008, 06/13/2009   Influenza, High Dose Seasonal PF 06/23/2018   Influenza, Seasonal, Injecte, Preservative Fre 06/13/2013   Influenza-Unspecified 06/20/2014, 06/19/2019, 06/18/2020   PFIZER(Purple Top)SARS-COV-2 Vaccination 10/18/2019, 11/12/2019, 06/09/2020   Pfizer Covid-19 Vaccine Bivalent Booster 28yrs & up 06/04/2021   Pneumococcal Conjugate-13 11/16/2013    Pneumococcal Polysaccharide-23 08/07/2007, 11/15/2012   Pneumococcal-Unspecified 03/02/2018   Td 09/13/1994   Varicella 02/12/1952   Zoster Recombinat (Shingrix) 03/21/2019, 05/22/2019   Zoster, Live 01/01/2008, 05/29/2019  There are no preventive care reminders to display for this patient.    Past Medical History:  Diagnosis Date   Allergy    dust, dogs, grass   EN (erythema nodosum)    Hyperlipidemia    Hyperplastic colonic polyp    Hypertension    Hypothyroidism    Obesity    Past Surgical History:  Procedure Laterality Date   ABDOMINAL HYSTERECTOMY     BREAST REDUCTION SURGERY  2002   BUNIONECTOMY     both feet   COLONOSCOPY  01/22/2014   CYST EXCISION  12/15/12   punch biopsy left shoulder blade   HAMMER TOE SURGERY     right 2nd toe   LAPAROSCOPIC ASSISTED VAGINAL HYSTERECTOMY  1992   with BSO   OOPHORECTOMY     TONSILLECTOMY      reports that she has never smoked. She has never used smokeless tobacco. She reports that she does not currently use alcohol. She reports that she does not use drugs. family history includes Cancer in her father; Diabetes in her sister; Heart attack in her mother; Heart disease in her sister; Hyperlipidemia in her mother; Hypertension in her mother and sister; Stroke in her mother. Allergies  Allergen Reactions  Amoxicillin Rash   Other Rash    SURGICAL TAPE CAUSES BLISTERS   Indomethacin     Took with ASA and caused severe bloating   Prednisone     Developed acne   Prevnar 13 [Pneumococcal 13-Val Conj Vacc]     Arm became very sore and pt developed "fiery red knot" at site   Tetanus Toxoid     Entire arm became swollen and turned red   Tetracycline     "hot, red knots" below knees   Current Outpatient Medications on File Prior to Visit  Medication Sig Dispense Refill   aspirin 81 MG tablet Take 81 mg by mouth daily.     meloxicam (MOBIC) 15 MG tablet Take 1 tablet (15 mg total) by mouth daily as needed for pain. 90 tablet 1    polyethylene glycol (MIRALAX / GLYCOLAX) 17 g packet Take 17 g by mouth daily.     traMADol (ULTRAM) 50 MG tablet 1 tab by mouth once at bedtime as needed for pain 90 tablet 1   No current facility-administered medications on file prior to visit.        ROS:  All others reviewed and negative.  Objective        PE:  BP 124/78 (BP Location: Right Arm, Patient Position: Sitting, Cuff Size: Normal)   Pulse 62   Temp 98.6 F (37 C) (Oral)   Ht 5\' 6"  (1.676 m)   Wt 174 lb (78.9 kg)   LMP 09/13/1994   SpO2 99%   BMI 28.08 kg/m                 Constitutional: Pt appears in NAD               HENT: Head: NCAT.                Right Ear: External ear normal.                 Left Ear: External ear normal.                Eyes: . Pupils are equal, round, and reactive to light. Conjunctivae and EOM are normal               Nose: without d/c or deformity               Neck: Neck supple. Gross normal ROM               Cardiovascular: Normal rate and regular rhythm.                 Pulmonary/Chest: Effort normal and breath sounds without rales or wheezing.                Abd:  Soft, NT, ND, + BS, no organomegaly               Neurological: Pt is alert. At baseline orientation, motor grossly intact               Skin: Skin is warm. No rashes, no other new lesions, LE edema - none               Psychiatric: Pt behavior is normal without agitation   Micro: none  Cardiac tracings I have personally interpreted today:  none  Pertinent Radiological findings (summarize): none   Lab Results  Component Value Date   WBC 6.3 10/21/2022   HGB 13.9 10/21/2022   HCT 41.9 10/21/2022  PLT 139.0 (L) 10/21/2022   GLUCOSE 94 02/01/2023   CHOL 145 02/01/2023   TRIG 66.0 02/01/2023   HDL 48.30 02/01/2023   LDLCALC 83 02/01/2023   ALT 19 02/01/2023   AST 21 02/01/2023   NA 138 02/01/2023   K 4.5 02/01/2023   CL 102 02/01/2023   CREATININE 0.85 02/01/2023   BUN 25 (H) 02/01/2023   CO2 28 02/01/2023    TSH 1.05 02/01/2023   HGBA1C 5.8 02/01/2023   MICROALBUR <0.7 10/21/2022   Assessment/Plan:  Mary Ward is a 77 y.o. White or Caucasian [1] female with  has a past medical history of Allergy, EN (erythema nodosum), Hyperlipidemia, Hyperplastic colonic polyp, Hypertension, Hypothyroidism, and Obesity.  Encounter for well adult exam with abnormal findings Age and sex appropriate education and counseling updated with regular exercise and diet Referrals for preventative services - none needed Immunizations addressed - none needed Smoking counseling  - none needed Evidence for depression or other mood disorder - none significant Most recent labs reviewed. I have personally reviewed and have noted: 1) the patient's medical and social history 2) The patient's current medications and supplements 3) The patient's height, weight, and BMI have been recorded in the chart   Hypothyroidism Lab Results  Component Value Date   TSH 1.05 02/01/2023   Stable, pt to continue levothyroxine 125 mcg qd   Hyperlipidemia Lab Results  Component Value Date   LDLCALC 83 02/01/2023   Uncontrolled, goal ldl < 70,, pt to increase lipitor 40 mg qd   Essential hypertension BP Readings from Last 3 Encounters:  02/08/23 124/78  08/23/22 120/60  08/09/22 124/72   Stable, pt to continue medical treatment micardis hct 40-12.5 qd   Hyperglycemia Lab Results  Component Value Date   HGBA1C 5.8 02/01/2023   Stable, pt to continue current medical treatment  - diet, wt control   B12 deficiency Lab Results  Component Value Date   VITAMINB12 235 02/01/2023   Low, to start oral replacement - b12 1000 mcg on mon - wed - fri  Left knee pain Worsening recently, to see sport med to further assess  Vitamin D deficiency Last vitamin D Lab Results  Component Value Date   VD25OH 42.43 02/01/2023   Stable, cont oral replacement  Followup: Return in about 6 months (around 08/11/2023).  Oliver Barre, MD 02/09/2023 7:44 PM [facility]

## 2023-02-09 ENCOUNTER — Encounter: Payer: Self-pay | Admitting: Internal Medicine

## 2023-02-09 DIAGNOSIS — M9901 Segmental and somatic dysfunction of cervical region: Secondary | ICD-10-CM | POA: Diagnosis not present

## 2023-02-09 DIAGNOSIS — M9900 Segmental and somatic dysfunction of head region: Secondary | ICD-10-CM | POA: Diagnosis not present

## 2023-02-09 DIAGNOSIS — E559 Vitamin D deficiency, unspecified: Secondary | ICD-10-CM | POA: Insufficient documentation

## 2023-02-09 DIAGNOSIS — M542 Cervicalgia: Secondary | ICD-10-CM | POA: Diagnosis not present

## 2023-02-09 DIAGNOSIS — G44201 Tension-type headache, unspecified, intractable: Secondary | ICD-10-CM | POA: Diagnosis not present

## 2023-02-09 NOTE — Assessment & Plan Note (Signed)
Lab Results  Component Value Date   VITAMINB12 235 02/01/2023   Low, to start oral replacement - b12 1000 mcg on mon - wed - fri

## 2023-02-09 NOTE — Assessment & Plan Note (Signed)
Lab Results  Component Value Date   LDLCALC 83 02/01/2023   Uncontrolled, goal ldl < 70,, pt to increase lipitor 40 mg qd

## 2023-02-09 NOTE — Assessment & Plan Note (Signed)
BP Readings from Last 3 Encounters:  02/08/23 124/78  08/23/22 120/60  08/09/22 124/72   Stable, pt to continue medical treatment micardis hct 40-12.5 qd

## 2023-02-09 NOTE — Assessment & Plan Note (Signed)
Lab Results  Component Value Date   TSH 1.05 02/01/2023   Stable, pt to continue levothyroxine 125 mcg qd

## 2023-02-09 NOTE — Assessment & Plan Note (Signed)
Worsening recently, to see sport med to further assess

## 2023-02-09 NOTE — Assessment & Plan Note (Signed)

## 2023-02-09 NOTE — Assessment & Plan Note (Signed)
Lab Results  Component Value Date   HGBA1C 5.8 02/01/2023   Stable, pt to continue current medical treatment  - diet, wt control  

## 2023-02-09 NOTE — Assessment & Plan Note (Signed)
Last vitamin D Lab Results  Component Value Date   VD25OH 42.43 02/01/2023   Stable, cont oral replacement

## 2023-02-10 DIAGNOSIS — G44201 Tension-type headache, unspecified, intractable: Secondary | ICD-10-CM | POA: Diagnosis not present

## 2023-02-10 DIAGNOSIS — M9901 Segmental and somatic dysfunction of cervical region: Secondary | ICD-10-CM | POA: Diagnosis not present

## 2023-02-10 DIAGNOSIS — M9900 Segmental and somatic dysfunction of head region: Secondary | ICD-10-CM | POA: Diagnosis not present

## 2023-02-10 DIAGNOSIS — M542 Cervicalgia: Secondary | ICD-10-CM | POA: Diagnosis not present

## 2023-02-14 DIAGNOSIS — M9901 Segmental and somatic dysfunction of cervical region: Secondary | ICD-10-CM | POA: Diagnosis not present

## 2023-02-14 DIAGNOSIS — M9900 Segmental and somatic dysfunction of head region: Secondary | ICD-10-CM | POA: Diagnosis not present

## 2023-02-14 DIAGNOSIS — G44201 Tension-type headache, unspecified, intractable: Secondary | ICD-10-CM | POA: Diagnosis not present

## 2023-02-14 DIAGNOSIS — M542 Cervicalgia: Secondary | ICD-10-CM | POA: Diagnosis not present

## 2023-02-16 DIAGNOSIS — M9901 Segmental and somatic dysfunction of cervical region: Secondary | ICD-10-CM | POA: Diagnosis not present

## 2023-02-16 DIAGNOSIS — M542 Cervicalgia: Secondary | ICD-10-CM | POA: Diagnosis not present

## 2023-02-16 DIAGNOSIS — M9900 Segmental and somatic dysfunction of head region: Secondary | ICD-10-CM | POA: Diagnosis not present

## 2023-02-16 DIAGNOSIS — G44201 Tension-type headache, unspecified, intractable: Secondary | ICD-10-CM | POA: Diagnosis not present

## 2023-02-17 ENCOUNTER — Telehealth: Payer: Self-pay

## 2023-02-17 NOTE — Telephone Encounter (Signed)
Spoke to patient she wanted to know why she needed to repeat chest ct.Advised last chest ct done 04/27/22 revealed a small lung nodule.Dr.Schumann recommended repeating non contrast chest ct in 1 year 04/2023.

## 2023-02-21 DIAGNOSIS — M9900 Segmental and somatic dysfunction of head region: Secondary | ICD-10-CM | POA: Diagnosis not present

## 2023-02-21 DIAGNOSIS — G44201 Tension-type headache, unspecified, intractable: Secondary | ICD-10-CM | POA: Diagnosis not present

## 2023-02-21 DIAGNOSIS — M9901 Segmental and somatic dysfunction of cervical region: Secondary | ICD-10-CM | POA: Diagnosis not present

## 2023-02-21 DIAGNOSIS — M542 Cervicalgia: Secondary | ICD-10-CM | POA: Diagnosis not present

## 2023-02-23 DIAGNOSIS — M9901 Segmental and somatic dysfunction of cervical region: Secondary | ICD-10-CM | POA: Diagnosis not present

## 2023-02-23 DIAGNOSIS — M542 Cervicalgia: Secondary | ICD-10-CM | POA: Diagnosis not present

## 2023-02-23 DIAGNOSIS — M9900 Segmental and somatic dysfunction of head region: Secondary | ICD-10-CM | POA: Diagnosis not present

## 2023-02-23 DIAGNOSIS — G44201 Tension-type headache, unspecified, intractable: Secondary | ICD-10-CM | POA: Diagnosis not present

## 2023-02-28 DIAGNOSIS — M542 Cervicalgia: Secondary | ICD-10-CM | POA: Diagnosis not present

## 2023-02-28 DIAGNOSIS — G44201 Tension-type headache, unspecified, intractable: Secondary | ICD-10-CM | POA: Diagnosis not present

## 2023-02-28 DIAGNOSIS — M9900 Segmental and somatic dysfunction of head region: Secondary | ICD-10-CM | POA: Diagnosis not present

## 2023-02-28 DIAGNOSIS — M9901 Segmental and somatic dysfunction of cervical region: Secondary | ICD-10-CM | POA: Diagnosis not present

## 2023-03-02 DIAGNOSIS — G44201 Tension-type headache, unspecified, intractable: Secondary | ICD-10-CM | POA: Diagnosis not present

## 2023-03-02 DIAGNOSIS — M9901 Segmental and somatic dysfunction of cervical region: Secondary | ICD-10-CM | POA: Diagnosis not present

## 2023-03-02 DIAGNOSIS — M9900 Segmental and somatic dysfunction of head region: Secondary | ICD-10-CM | POA: Diagnosis not present

## 2023-03-02 DIAGNOSIS — M542 Cervicalgia: Secondary | ICD-10-CM | POA: Diagnosis not present

## 2023-03-03 DIAGNOSIS — Z1231 Encounter for screening mammogram for malignant neoplasm of breast: Secondary | ICD-10-CM | POA: Diagnosis not present

## 2023-03-03 DIAGNOSIS — Z6829 Body mass index (BMI) 29.0-29.9, adult: Secondary | ICD-10-CM | POA: Diagnosis not present

## 2023-03-03 DIAGNOSIS — Z01419 Encounter for gynecological examination (general) (routine) without abnormal findings: Secondary | ICD-10-CM | POA: Diagnosis not present

## 2023-03-07 DIAGNOSIS — M9900 Segmental and somatic dysfunction of head region: Secondary | ICD-10-CM | POA: Diagnosis not present

## 2023-03-07 DIAGNOSIS — G44201 Tension-type headache, unspecified, intractable: Secondary | ICD-10-CM | POA: Diagnosis not present

## 2023-03-07 DIAGNOSIS — M9901 Segmental and somatic dysfunction of cervical region: Secondary | ICD-10-CM | POA: Diagnosis not present

## 2023-03-07 DIAGNOSIS — M542 Cervicalgia: Secondary | ICD-10-CM | POA: Diagnosis not present

## 2023-03-09 DIAGNOSIS — M9900 Segmental and somatic dysfunction of head region: Secondary | ICD-10-CM | POA: Diagnosis not present

## 2023-03-09 DIAGNOSIS — M9901 Segmental and somatic dysfunction of cervical region: Secondary | ICD-10-CM | POA: Diagnosis not present

## 2023-03-09 DIAGNOSIS — M542 Cervicalgia: Secondary | ICD-10-CM | POA: Diagnosis not present

## 2023-03-09 DIAGNOSIS — G44201 Tension-type headache, unspecified, intractable: Secondary | ICD-10-CM | POA: Diagnosis not present

## 2023-03-23 DIAGNOSIS — M542 Cervicalgia: Secondary | ICD-10-CM | POA: Diagnosis not present

## 2023-03-23 DIAGNOSIS — M9900 Segmental and somatic dysfunction of head region: Secondary | ICD-10-CM | POA: Diagnosis not present

## 2023-03-23 DIAGNOSIS — G44201 Tension-type headache, unspecified, intractable: Secondary | ICD-10-CM | POA: Diagnosis not present

## 2023-03-23 DIAGNOSIS — M9901 Segmental and somatic dysfunction of cervical region: Secondary | ICD-10-CM | POA: Diagnosis not present

## 2023-04-25 ENCOUNTER — Ambulatory Visit (HOSPITAL_COMMUNITY)
Admission: RE | Admit: 2023-04-25 | Discharge: 2023-04-25 | Disposition: A | Discharge status: Home / Self Care | Payer: PPO | Source: Ambulatory Visit | Attending: Cardiology | Admitting: Cardiology

## 2023-04-25 DIAGNOSIS — I700 Atherosclerosis of aorta: Secondary | ICD-10-CM | POA: Diagnosis not present

## 2023-04-25 DIAGNOSIS — R911 Solitary pulmonary nodule: Secondary | ICD-10-CM | POA: Diagnosis not present

## 2023-05-05 DIAGNOSIS — H40053 Ocular hypertension, bilateral: Secondary | ICD-10-CM | POA: Diagnosis not present

## 2023-05-05 DIAGNOSIS — H40013 Open angle with borderline findings, low risk, bilateral: Secondary | ICD-10-CM | POA: Diagnosis not present

## 2023-05-09 DIAGNOSIS — M9901 Segmental and somatic dysfunction of cervical region: Secondary | ICD-10-CM | POA: Diagnosis not present

## 2023-05-09 DIAGNOSIS — M9900 Segmental and somatic dysfunction of head region: Secondary | ICD-10-CM | POA: Diagnosis not present

## 2023-05-09 DIAGNOSIS — M542 Cervicalgia: Secondary | ICD-10-CM | POA: Diagnosis not present

## 2023-05-09 DIAGNOSIS — G44201 Tension-type headache, unspecified, intractable: Secondary | ICD-10-CM | POA: Diagnosis not present

## 2023-05-17 ENCOUNTER — Other Ambulatory Visit: Payer: Self-pay | Admitting: Internal Medicine

## 2023-05-18 ENCOUNTER — Other Ambulatory Visit: Payer: Self-pay

## 2023-05-18 DIAGNOSIS — G44201 Tension-type headache, unspecified, intractable: Secondary | ICD-10-CM | POA: Diagnosis not present

## 2023-05-18 DIAGNOSIS — M9900 Segmental and somatic dysfunction of head region: Secondary | ICD-10-CM | POA: Diagnosis not present

## 2023-05-18 DIAGNOSIS — M542 Cervicalgia: Secondary | ICD-10-CM | POA: Diagnosis not present

## 2023-05-18 DIAGNOSIS — M9901 Segmental and somatic dysfunction of cervical region: Secondary | ICD-10-CM | POA: Diagnosis not present

## 2023-05-22 NOTE — Progress Notes (Unsigned)
Cardiology Office Note:    Date:  05/23/2023   ID:  Mary Ward, DOB October 23, 1945, MRN [id number]  PCP:  Corwin Levins, MD  Cardiologist:  Little Ishikawa, MD  Electrophysiologist:  None   Referring MD: Corwin Levins, MD   No chief complaint on file.   History of Present Illness:    Mary Ward is a 77 y.o. female with a hx of hypertension, hypothyroidism, hyperlipidemia, erythema nodosum who presents for follow-up.  She was referred by Dr. Jonny Ruiz for evaluation of coronary calcifications, initially seen on 05/05/2021.  She denies any chest pain or dyspnea.  Reports she walks 2.5 miles 4-5 times a week and also does aerobic workouts.  No exertional symptoms.  She denies any lightheadedness, syncope, lower extremity edema.  Reports has very rare episodes of palpitations lasting few seconds but may be years between episodes.  Reports blood pressure has been well controlled.  She lost 55 pounds since last year with weight watchers.  No smoking history.  Family history includes mother had MI in 21s and sister died of MI at early 11s.  Calcium score on 02/17/2021 was 258 (80th percentile).  Also with borderline aortic dilatation measuring 38 mm.  Underwent Lexiscan Myoview in 11/2017 which showed normal perfusion, EF 68%.  Echocardiogram 05/08/2021 showed EF 55 to 60%, basal inferior hypokinesis, grade 1 diastolic dysfunction, mild RV dysfunction, mild MR, mild dilatation of ascending aorta measuring 39 mm.  Since last clinic visit, she reports has been having worsening leg pain.  States that she feels significant pain in the back of her left leg when she lies down, is worse if she has exercise during the day.  Denies any chest pain, dyspnea, lightheadedness, syncope, lower extremity edema, or palpitations.   Past Medical History:  Diagnosis Date   Allergy    dust, dogs, grass   EN (erythema nodosum)    Hyperlipidemia    Hyperplastic colonic polyp    Hypertension    Hypothyroidism    Obesity      Past Surgical History:  Procedure Laterality Date   ABDOMINAL HYSTERECTOMY     BREAST REDUCTION SURGERY  2002   BUNIONECTOMY     both feet   COLONOSCOPY  01/22/2014   CYST EXCISION  12/15/12   punch biopsy left shoulder blade   HAMMER TOE SURGERY     right 2nd toe   LAPAROSCOPIC ASSISTED VAGINAL HYSTERECTOMY  1992   with BSO   OOPHORECTOMY     TONSILLECTOMY      Current Medications: Current Meds  Medication Sig   aspirin 81 MG tablet Take 81 mg by mouth daily.   atorvastatin (LIPITOR) 40 MG tablet Take 1 tablet (40 mg total) by mouth daily.   meloxicam (MOBIC) 15 MG tablet TAKE 1 TABLET BY MOUTH DAILY AS NEEDED FOR PAIN   polyethylene glycol (MIRALAX / GLYCOLAX) 17 g packet Take 17 g by mouth daily.   SYNTHROID 125 MCG tablet Take 1 tablet (125 mcg total) by mouth daily.   telmisartan-hydrochlorothiazide (MICARDIS HCT) 40-12.5 MG tablet Take 1 tablet by mouth daily.   traMADol (ULTRAM) 50 MG tablet 1 tab by mouth once at bedtime as needed for pain     Allergies:   Indomethacin, Prednisone, Prevnar 13 [pneumococcal 13-val conj vacc], Tetanus toxoid, and Tetracycline   Social History   Socioeconomic History   Marital status: Married    Spouse name: Not on file   Number of children: 0   Years  of education: Not on file   Highest education level: Some college, no degree  Occupational History   Occupation: Optometrist: [employer]  Tobacco Use   Smoking status: Never   Smokeless tobacco: Never  Vaping Use   Vaping status: Never Used  Substance and Sexual Activity   Alcohol use: Not Currently    Alcohol/week: 0.0 standard drinks of alcohol   Drug use: No   Sexual activity: Never    Birth control/protection: Post-menopausal, Surgical    Comment: Hysterectomy  Other Topics Concern   Not on file  Social History Narrative   Family history of high Cholesterol   Social Determinants of Health   Financial Resource Strain: Low Risk   (02/04/2023)   Overall Financial Resource Strain (CARDIA)    Difficulty of Paying Living Expenses: Not hard at all  Food Insecurity: No Food Insecurity (02/04/2023)   Hunger Vital Sign    Worried About Running Out of Food in the Last Year: Never true    Ran Out of Food in the Last Year: Never true  Transportation Needs: No Transportation Needs (02/04/2023)   PRAPARE - Administrator, Civil Service (Medical): No    Lack of Transportation (Non-Medical): No  Physical Activity: Sufficiently Active (02/04/2023)   Exercise Vital Sign    Days of Exercise per Week: 3 days    Minutes of Exercise per Session: 60 min  Stress: No Stress Concern Present (02/04/2023)   [employer] of Occupational Health - Occupational Stress Questionnaire    Feeling of Stress : Not at all  Social Connections: Socially Integrated (02/04/2023)   Social Connection and Isolation Panel [NHANES]    Frequency of Communication with Friends and Family: More than three times a week    Frequency of Social Gatherings with Friends and Family: More than three times a week    Attends Religious Services: More than 4 times per year    Active Member of [employer] or Organizations: No    Attends Engineer, structural: More than 4 times per year    Marital Status: Married     Family History: The patient's family history includes Cancer in her father; Diabetes in her sister; Heart attack in her mother; Heart disease in her sister; Hyperlipidemia in her mother; Hypertension in her mother and sister; Stroke in her mother. There is no history of Colon cancer, Esophageal cancer, Rectal cancer, Stomach cancer, or Colon polyps.  ROS:   Please see the history of present illness.     All other systems reviewed and are negative.  EKGs/Labs/Other Studies Reviewed:    The following studies were reviewed today:   EKG:   05/06/2022: Sinus bradycardia, rate 59, no ST abnormalities, poor R wave progression 05/23/23: Normal sinus  rhythm, rate 64, Q waves in inferior leads, poor R wave progression  Recent Labs: 10/21/2022: Hemoglobin 13.9; Platelets 139.0 02/01/2023: ALT 19; BUN 25; Creatinine, Ser 0.85; Potassium 4.5; Sodium 138; TSH 1.05  Recent Lipid Panel    Component Value Date/Time   CHOL 145 02/01/2023 0755   TRIG 66.0 02/01/2023 0755   TRIG 85 07/28/2006 0740   HDL 48.30 02/01/2023 0755   CHOLHDL 3 02/01/2023 0755   VLDL 13.2 02/01/2023 0755   LDLCALC 83 02/01/2023 0755    Physical Exam:    VS:  BP 136/86   Pulse 68   Ht 5\' 5"  (1.651 m)   Wt 177 lb (80.3 kg)   LMP 09/13/1994  SpO2 97%   BMI 29.45 kg/m     Wt Readings from Last 3 Encounters:  05/23/23 177 lb (80.3 kg)  02/08/23 174 lb (78.9 kg)  08/23/22 172 lb 9.6 oz (78.3 kg)     GEN:  Well nourished, well developed in no acute distress HEENT: Normal NECK: No JVD; No carotid bruits CARDIAC: RRR,2/6 systolic murmur RESPIRATORY:  Clear to auscultation without rales, wheezing or rhonchi  ABDOMEN: Soft, non-tender, non-distended MUSCULOSKELETAL:  No edema; No deformity  SKIN: Warm and dry NEUROLOGIC:  Alert and oriented x 3 PSYCHIATRIC:  Normal affect   ASSESSMENT:    1. Essential hypertension   2. Coronary artery calcification seen on CAT scan      PLAN:    CAD: Calcium score on 02/17/2021 was 258 (80th percentile).  Denies any anginal symptoms. -Continue atorvastatin 40 mg daily -Continue aspirin 81 mg daily  Abnormal EKG: Poor R wave progression.  Echocardiogram 05/08/2021 showed EF 55 to 60%, basal inferior hypokinesis, grade 1 diastolic dysfunction, mild RV dysfunction, mild MR, mild dilatation of ascending aorta measuring 39 mm.  Aortic dilatation: Borderline ascending aortic dilatation noted on noncontrast chest CT for calcium score.  CTA chest 04/27/2022 showed stable ascending aorta measured 38 mm.  Leg pain: will check ABIs.  Asked to hold her statin for 2 weeks to see if improvement; if leg pain improves, will switch to  alternative statin.  If no change, would resume atorvastatin 40 mg daily  Hypertension: On telmisartan-hydrochlorothiazide 40-12.5 mg daily.  Hyperlipidemia: On atorvastatin 20 mg daily, LDL 83 on 02/01/23.  Atorvastatin increased to 40 mg daily at that time.  Check lipid panel  Pulmonary nodule: Noted on CTA chest 04/27/2022.  Stable on CT chest 04/2023, no further follow-up needed  RTC in 6 months   Medication Adjustments/Labs and Tests Ordered: Current medicines are reviewed at length with the patient today.  Concerns regarding medicines are outlined above.  Orders Placed This Encounter  Procedures   EKG 12-Lead   No orders of the defined types were placed in this encounter.    There are no Patient Instructions on file for this visit.   Signed, Little Ishikawa, MD  05/23/2023 3:17 PM    [facility]

## 2023-05-23 ENCOUNTER — Encounter: Payer: Self-pay | Admitting: Cardiology

## 2023-05-23 ENCOUNTER — Ambulatory Visit: Payer: PPO | Attending: Cardiology | Admitting: Cardiology

## 2023-05-23 VITALS — BP 136/86 | HR 68 | Ht 65.0 in | Wt 177.0 lb

## 2023-05-23 DIAGNOSIS — I2510 Atherosclerotic heart disease of native coronary artery without angina pectoris: Secondary | ICD-10-CM

## 2023-05-23 DIAGNOSIS — I77819 Aortic ectasia, unspecified site: Secondary | ICD-10-CM | POA: Diagnosis not present

## 2023-05-23 DIAGNOSIS — M79605 Pain in left leg: Secondary | ICD-10-CM | POA: Diagnosis not present

## 2023-05-23 DIAGNOSIS — M79606 Pain in leg, unspecified: Secondary | ICD-10-CM

## 2023-05-23 DIAGNOSIS — R9431 Abnormal electrocardiogram [ECG] [EKG]: Secondary | ICD-10-CM | POA: Diagnosis not present

## 2023-05-23 DIAGNOSIS — I10 Essential (primary) hypertension: Secondary | ICD-10-CM

## 2023-05-23 NOTE — Patient Instructions (Addendum)
Medication Instructions:  No Changes *If you need a refill on your cardiac medications before your next appointment, please call your pharmacy*   Lab Work: BMET, Lipid Panel, Magnesium Level. To Be Done Today. If you have labs (blood work) drawn today and your tests are completely normal, you will receive your results only by: MyChart Message (if you have MyChart) OR A paper copy in the mail If you have any lab test that is abnormal or we need to change your treatment, we will call you to review the results.   Testing/Procedures:[address], [address]. Your physician has requested that you have an ankle brachial index (ABI). During this test an ultrasound and blood pressure cuff are used to evaluate the arteries that supply the arms and legs with blood. Allow thirty minutes for this exam. There are no restrictions or special instructions.    Follow-Up:  At [facility], you and your health needs are our priority.  As part of our continuing mission to provide you with exceptional heart care, we have created designated Provider Care Teams.  These Care Teams include your primary Cardiologist (physician) and Advanced Practice Providers (APPs -  Physician Assistants and Nurse Practitioners) who all work together to provide you with the care you need, when you need it.  We recommend signing up for the patient portal called "MyChart".  Sign up information is provided on this After Visit Summary.  MyChart is used to connect with patients for Virtual Visits (Telemedicine).  Patients are able to view lab/test results, encounter notes, upcoming appointments, etc.  Non-urgent messages can be sent to your provider as well.   To learn more about what you can do with MyChart, go to [email or web address].    Your next appointment:   6 month(s)  Provider:   Little Ishikawa, MD     Other Instructions Hold Statin for 2 Weeks. Call Office with update on Leg Craps.

## 2023-05-24 LAB — LIPID PANEL
Chol/HDL Ratio: 2.6 ratio (ref 0.0–4.4)
Cholesterol, Total: 137 mg/dL (ref 100–199)
HDL: 53 mg/dL (ref >39)
LDL Chol Calc (NIH): 65 mg/dL (ref 0–99)
Triglycerides: 101 mg/dL (ref 0–149)
VLDL Cholesterol Cal: 19 mg/dL (ref 5–40)

## 2023-05-24 LAB — BASIC METABOLIC PANEL
BUN/Creatinine Ratio: 21 (ref 12–28)
BUN: 18 mg/dL (ref 8–27)
CO2: 25 mmol/L (ref 20–29)
Calcium: 9.7 mg/dL (ref 8.7–10.3)
Chloride: 98 mmol/L (ref 96–106)
Creatinine, Ser: 0.84 mg/dL (ref 0.57–1.00)
Glucose: 97 mg/dL (ref 70–99)
Potassium: 4.9 mmol/L (ref 3.5–5.2)
Sodium: 140 mmol/L (ref 134–144)
eGFR: 72 mL/min/{1.73_m2} (ref >59)

## 2023-05-24 LAB — MAGNESIUM: Magnesium: 2.1 mg/dL (ref 1.6–2.3)

## 2023-05-25 DIAGNOSIS — M542 Cervicalgia: Secondary | ICD-10-CM | POA: Diagnosis not present

## 2023-05-25 DIAGNOSIS — M9900 Segmental and somatic dysfunction of head region: Secondary | ICD-10-CM | POA: Diagnosis not present

## 2023-05-25 DIAGNOSIS — G44201 Tension-type headache, unspecified, intractable: Secondary | ICD-10-CM | POA: Diagnosis not present

## 2023-05-25 DIAGNOSIS — M9901 Segmental and somatic dysfunction of cervical region: Secondary | ICD-10-CM | POA: Diagnosis not present

## 2023-05-27 ENCOUNTER — Other Ambulatory Visit: Payer: Self-pay | Admitting: Internal Medicine

## 2023-05-28 ENCOUNTER — Encounter: Payer: Self-pay | Admitting: Cardiology

## 2023-05-28 ENCOUNTER — Encounter: Payer: Self-pay | Admitting: Internal Medicine

## 2023-05-30 ENCOUNTER — Encounter: Payer: Self-pay | Admitting: Internal Medicine

## 2023-05-31 ENCOUNTER — Encounter (HOSPITAL_COMMUNITY): Payer: Self-pay

## 2023-05-31 ENCOUNTER — Ambulatory Visit (HOSPITAL_COMMUNITY)
Admission: RE | Admit: 2023-05-31 | Discharge: 2023-05-31 | Disposition: A | Discharge status: Home / Self Care | Payer: PPO | Source: Ambulatory Visit

## 2023-05-31 VITALS — BP 163/80 | HR 64 | Temp 98.3°F | Resp 18

## 2023-05-31 DIAGNOSIS — M79605 Pain in left leg: Secondary | ICD-10-CM | POA: Diagnosis not present

## 2023-05-31 DIAGNOSIS — M542 Cervicalgia: Secondary | ICD-10-CM | POA: Diagnosis not present

## 2023-05-31 DIAGNOSIS — M5432 Sciatica, left side: Secondary | ICD-10-CM | POA: Diagnosis not present

## 2023-05-31 DIAGNOSIS — M9901 Segmental and somatic dysfunction of cervical region: Secondary | ICD-10-CM | POA: Diagnosis not present

## 2023-05-31 DIAGNOSIS — M9900 Segmental and somatic dysfunction of head region: Secondary | ICD-10-CM | POA: Diagnosis not present

## 2023-05-31 DIAGNOSIS — G44201 Tension-type headache, unspecified, intractable: Secondary | ICD-10-CM | POA: Diagnosis not present

## 2023-05-31 MED ORDER — PREDNISONE 20 MG PO TABS: 20.0000 mg | ORAL_TABLET | Freq: Every day | ORAL | 0 refills | Status: DC

## 2023-05-31 NOTE — Discharge Instructions (Signed)
Take the prednisone once daily for the next 3 days This is to reduce inflammation and therefore pain Please do not take your Advil or meloxicam while using the prednisone  Keep appointment with your primary care provider for Thursday

## 2023-05-31 NOTE — ED Triage Notes (Signed)
Pt states she is having left upper leg pain since Friday. She has been resting, ice and elevating on repeat since. She also went to chiropractor this morning and she feels like that has helped. She has been taking IBU evert 4 hours and tramadol twice a day. She does have an appt with her PCP on Thursday. She did stop her Lipitor a week ago.

## 2023-05-31 NOTE — ED Provider Notes (Signed)
[facility]    CSN: [id number] Arrival date & time: 05/31/23  1110     History   Chief Complaint Chief Complaint  Patient presents with   Leg Pain    *Severe* sciatica pain in left thigh - Entered by patient    HPI Mary Ward is a 77 y.o. female.  Here with left leg pain for 4 days. Located left glute into left lateral thigh. Pain is rated 6/10 today Not having weakness, numbness or tingling Has used advil, tramadol, rest, ice, and elevation Also went to chiropractor today that may have helped She has been seen for this before, may have been sciatica in the past.  Has PCP visit scheduled in 2 days Also has a vascular appointment in 6 days  Hx lumbar radiculopathy, reports "bulging disc"   Past Medical History:  Diagnosis Date   Allergy    dust, dogs, grass   EN (erythema nodosum)    Hyperlipidemia    Hyperplastic colonic polyp    Hypertension    Hypothyroidism    Obesity     Patient Active Problem List   Diagnosis Date Noted   Vitamin D deficiency 02/09/2023   Left knee pain 02/08/2023   Acquired deformity of lower leg 08/09/2022   B12 deficiency 08/09/2022   Left lumbar radiculopathy 04/24/2022   Osteopenia 04/24/2022   Abdominal pain 01/19/2022   Allergic rhinitis 11/05/2020   Acute diverticulitis 04/11/2020   Right renal mass 04/11/2020   Insomnia 01/12/2020   Chest pain 11/05/2017   Cough 12/18/2015   Coronary artery calcification seen on CAT scan 12/19/2014   Hyperglycemia 12/19/2014   Encounter for well adult exam with abnormal findings 11/12/2011   MENOPAUSAL DISORDER 09/29/2009   ELECTROCARDIOGRAM, ABNORMAL 08/07/2008   GERD 08/06/2007   COLONIC POLYPS, HX OF 08/06/2007   Hypothyroidism 05/07/2007   Hyperlipidemia 05/07/2007   OBESITY 05/07/2007   Anxiety with depression 05/07/2007   Essential hypertension 05/07/2007   Erythema nodosum 04/28/2007    Past Surgical History:  Procedure Laterality Date   ABDOMINAL HYSTERECTOMY      BREAST REDUCTION SURGERY  2002   BUNIONECTOMY     both feet   COLONOSCOPY  01/22/2014   CYST EXCISION  12/15/12   punch biopsy left shoulder blade   HAMMER TOE SURGERY     right 2nd toe   LAPAROSCOPIC ASSISTED VAGINAL HYSTERECTOMY  1992   with BSO   OOPHORECTOMY     TONSILLECTOMY      OB History     Gravida  0   Para      Term      Preterm      AB      Living         SAB      IAB      Ectopic      Multiple      Live Births               Home Medications    Prior to Admission medications   Medication Sig Start Date End Date Taking? Authorizing Provider  aspirin 81 MG tablet Take 81 mg by mouth daily.   Yes [provider]  atorvastatin (LIPITOR) 40 MG tablet Take 1 tablet (40 mg total) by mouth daily. 02/08/23  Yes Corwin Levins, MD  cholecalciferol (VITAMIN D3) 25 MCG (1000 UNIT) tablet  12/28/22  Yes [provider]  Cyanocobalamin (VITAMIN B-12) 1000 MCG/15ML LIQD  12/28/22  Yes [provider]  meloxicam (MOBIC) 15 MG tablet TAKE 1 TABLET BY MOUTH DAILY AS NEEDED FOR PAIN 05/18/23  Yes Corwin Levins, MD  polyethylene glycol (MIRALAX / GLYCOLAX) 17 g packet Take 17 g by mouth daily.   Yes [provider]  predniSONE (DELTASONE) 20 MG tablet Take 1 tablet (20 mg total) by mouth daily with breakfast for 3 days. 05/31/23 06/03/23 Yes Jann Ra, PA-C  SYNTHROID 125 MCG tablet Take 1 tablet (125 mcg total) by mouth daily. 02/08/23  Yes Corwin Levins, MD  telmisartan-hydrochlorothiazide (MICARDIS HCT) 40-12.5 MG tablet Take 1 tablet by mouth daily. 02/08/23  Yes Corwin Levins, MD  traMADol Janean Sark) 50 MG tablet TAKE ONE TABLET BY MOUTH EVERY NIGHT AT BEDTIME AS NEEDED FOR PAIN 05/27/23  Yes Corwin Levins, MD    Family History Family History  Problem Relation Age of Onset   Hyperlipidemia Mother    Stroke Mother    Hypertension Mother    Heart attack Mother    Cancer Father        Lung   Diabetes Sister    Heart  disease Sister        CAD   Hypertension Sister    Colon cancer Neg Hx    Esophageal cancer Neg Hx    Rectal cancer Neg Hx    Stomach cancer Neg Hx    Colon polyps Neg Hx     Social History Social History   Tobacco Use   Smoking status: Never   Smokeless tobacco: Never  Vaping Use   Vaping status: Never Used  Substance Use Topics   Alcohol use: Not Currently    Alcohol/week: 0.0 standard drinks of alcohol   Drug use: No     Allergies   Indomethacin, Prevnar 13 [pneumococcal 13-val conj vacc], Tetanus toxoid, and Tetracycline   Review of Systems Review of Systems As per HPI  Physical Exam Triage Vital Signs ED Triage Vitals  Encounter Vitals Group     BP 05/31/23 1137 (!) 163/80     Systolic BP Percentile --      Diastolic BP Percentile --      Pulse Rate 05/31/23 1137 64     Resp 05/31/23 1137 18     Temp 05/31/23 1137 98.3 F (36.8 C)     Temp Source 05/31/23 1137 Oral     SpO2 05/31/23 1137 94 %     Weight --      Height --      Head Circumference --      Peak Flow --      Pain Score 05/31/23 1133 7     Pain Loc --      Pain Education --      Exclude from Growth Chart --    No data found.  Updated Vital Signs BP (!) 163/80 (BP Location: Left Arm)   Pulse 64   Temp 98.3 F (36.8 C) (Oral)   Resp 18   LMP 09/13/1994   SpO2 94%    Physical Exam Vitals and nursing note reviewed.  Constitutional:      General: She is not in acute distress. HENT:     Mouth/Throat:     Pharynx: Oropharynx is clear.  Cardiovascular:     Rate and Rhythm: Normal rate and regular rhythm.     Pulses: Normal pulses.  Pulmonary:     Effort: Pulmonary effort is normal.  Musculoskeletal:     Cervical back: Normal range of motion.  Lumbar back: No tenderness or bony tenderness. Normal range of motion. Negative right straight leg raise test and negative left straight leg raise test.     Comments: No bony tenderness of spine or hips. Full ROM at the hips and knees  of bilateral legs. SLR and knee to chest does not elicit pain  Skin:    Capillary Refill: Capillary refill takes less than 2 seconds.  Neurological:     Mental Status: She is alert and oriented to person, place, and time.     Cranial Nerves: No cranial nerve deficit.     Sensory: Sensation is intact. No sensory deficit.     Motor: No weakness or abnormal muscle tone.     Coordination: Coordination is intact.     Gait: Gait is intact.     Comments: Strength and sensation intact distal extremities. No sensory deficit of the thigh. No pain with sitting or standing. Normal gait      UC Treatments / Results  Labs (all labs ordered are listed, but only abnormal results are displayed) Labs Reviewed - No data to display  EKG  Radiology No results found.  Procedures Procedures  Medications Ordered in UC Medications - No data to display  Initial Impression / Assessment and Plan / UC Course  I have reviewed the triage vital signs and the nursing notes.  Pertinent labs & imaging results that were available during my care of the patient were reviewed by me and considered in my medical decision making (see chart for details).  No indication for xray imaging at this time Offered IM steroid for presumed sciatic nerve pain or other inflammation. Patient would like to avoid a shot today. Will try low dose prednisone daily for 3 days. Advised to not use advil or her meloxicam at the same time. Discussed this is a temporary treatment until she has follow up with her primary in 2 days. Patient is agreeable to plan, all questions answered   Final Clinical Impressions(s) / UC Diagnoses   Final diagnoses:  Left leg pain  Sciatica of left side     Discharge Instructions      Take the prednisone once daily for the next 3 days This is to reduce inflammation and therefore pain Please do not take your Advil or meloxicam while using the prednisone  Keep appointment with your primary care  provider for Thursday     ED Prescriptions     Medication Sig Dispense Auth. Provider   predniSONE (DELTASONE) 20 MG tablet Take 1 tablet (20 mg total) by mouth daily with breakfast for 3 days. 3 tablet Raenah Murley, Lurena Joiner, PA-C      PDMP not reviewed this encounter.   Marlow Baars, [place] 05/31/23 1333

## 2023-06-01 DIAGNOSIS — M9900 Segmental and somatic dysfunction of head region: Secondary | ICD-10-CM | POA: Diagnosis not present

## 2023-06-01 DIAGNOSIS — G44201 Tension-type headache, unspecified, intractable: Secondary | ICD-10-CM | POA: Diagnosis not present

## 2023-06-01 DIAGNOSIS — M542 Cervicalgia: Secondary | ICD-10-CM | POA: Diagnosis not present

## 2023-06-01 DIAGNOSIS — M9901 Segmental and somatic dysfunction of cervical region: Secondary | ICD-10-CM | POA: Diagnosis not present

## 2023-06-02 ENCOUNTER — Other Ambulatory Visit: Payer: Self-pay | Admitting: Internal Medicine

## 2023-06-02 ENCOUNTER — Ambulatory Visit (INDEPENDENT_AMBULATORY_CARE_PROVIDER_SITE_OTHER): Payer: PPO

## 2023-06-02 ENCOUNTER — Ambulatory Visit: Payer: PPO | Admitting: Internal Medicine

## 2023-06-02 ENCOUNTER — Ambulatory Visit (INDEPENDENT_AMBULATORY_CARE_PROVIDER_SITE_OTHER): Payer: PPO | Admitting: Internal Medicine

## 2023-06-02 ENCOUNTER — Encounter: Payer: Self-pay | Admitting: Internal Medicine

## 2023-06-02 VITALS — BP 122/80 | HR 65 | Temp 98.7°F | Ht 65.0 in | Wt 174.0 lb

## 2023-06-02 DIAGNOSIS — R739 Hyperglycemia, unspecified: Secondary | ICD-10-CM | POA: Diagnosis not present

## 2023-06-02 DIAGNOSIS — M542 Cervicalgia: Secondary | ICD-10-CM | POA: Diagnosis not present

## 2023-06-02 DIAGNOSIS — M7072 Other bursitis of hip, left hip: Secondary | ICD-10-CM

## 2023-06-02 DIAGNOSIS — E559 Vitamin D deficiency, unspecified: Secondary | ICD-10-CM

## 2023-06-02 DIAGNOSIS — E785 Hyperlipidemia, unspecified: Secondary | ICD-10-CM | POA: Diagnosis not present

## 2023-06-02 DIAGNOSIS — E538 Deficiency of other specified B group vitamins: Secondary | ICD-10-CM | POA: Diagnosis not present

## 2023-06-02 DIAGNOSIS — I10 Essential (primary) hypertension: Secondary | ICD-10-CM

## 2023-06-02 DIAGNOSIS — G44201 Tension-type headache, unspecified, intractable: Secondary | ICD-10-CM | POA: Diagnosis not present

## 2023-06-02 DIAGNOSIS — M160 Bilateral primary osteoarthritis of hip: Secondary | ICD-10-CM | POA: Diagnosis not present

## 2023-06-02 DIAGNOSIS — M9900 Segmental and somatic dysfunction of head region: Secondary | ICD-10-CM | POA: Diagnosis not present

## 2023-06-02 DIAGNOSIS — E039 Hypothyroidism, unspecified: Secondary | ICD-10-CM | POA: Diagnosis not present

## 2023-06-02 DIAGNOSIS — M25552 Pain in left hip: Secondary | ICD-10-CM | POA: Diagnosis not present

## 2023-06-02 DIAGNOSIS — M25551 Pain in right hip: Secondary | ICD-10-CM | POA: Diagnosis not present

## 2023-06-02 DIAGNOSIS — M9901 Segmental and somatic dysfunction of cervical region: Secondary | ICD-10-CM | POA: Diagnosis not present

## 2023-06-02 DIAGNOSIS — M47817 Spondylosis without myelopathy or radiculopathy, lumbosacral region: Secondary | ICD-10-CM | POA: Diagnosis not present

## 2023-06-02 MED ORDER — PREDNISONE 10 MG PO TABS: ORAL_TABLET | ORAL | 0 refills | Status: DC

## 2023-06-02 MED ORDER — CELECOXIB 200 MG PO CAPS: 200.0000 mg | ORAL_CAPSULE | Freq: Two times a day (BID) | ORAL | 5 refills | Status: DC

## 2023-06-02 NOTE — Patient Instructions (Signed)
Please take all new medication as prescribed - the prednisone if the pain returns, and celebrex as needed for pain  No need to take the mobic or advil with the celebrex  You could take the tramadol as needed as well, but hopefully will not need this  Please continue all other medications as before, and refills have been done if requested.  Please have the pharmacy call with any other refills you may need.  Please keep your appointments with your specialists as you may have planned  Please go to the XRAY Department in the first floor for the x-ray testing  You will be contacted by phone if any changes need to be made immediately.  Otherwise, you will receive a letter about your results with an explanation, but please check with MyChart first.

## 2023-06-02 NOTE — Progress Notes (Signed)
Patient ID: Mary Ward, female   DOB: 03/15/1946, 77 y.o.   MRN: [id number]        Chief Complaint: follow up left thigh pain /hip pain, hld , htn, hyperglycemia, low thryoid, low b12       HPI:  Mary Ward is a 77 y.o. female here with c/o gradualy worsening left hip pain over the past yr, now more localized it seems to the left lateral  hip but with radiation to the knee.  Symptoms have been difficult to interpret and has seen numerous providers since MRI aug 2023, PT did not help, has seen NS several times, has seen ortho and chiropracter as well, also asked to hold statin for 2 wks recently per cardiology to see if may be statin related.  Pain has been mostly controlled with tramadol and advil, but now that well, and lastly saww UC on Tuesday with recommendation for left hip cortisone for bursitis but declined, but did take prednsone with MUCH improvement. On top of this, did unfortunately had a mechanical fall yesterday at the grocery today with pain to left knee, left elbow and right upper anterior chest, asking for films.  Has some bruising but little swelling and no skin lacerations.         Wt Readings from Last 3 Encounters:  06/02/23 174 lb (78.9 kg)  05/23/23 177 lb (80.3 kg)  02/08/23 174 lb (78.9 kg)   BP Readings from Last 3 Encounters:  06/02/23 122/80  05/31/23 (!) 163/80  05/23/23 136/86         Past Medical History:  Diagnosis Date   Allergy    dust, dogs, grass   EN (erythema nodosum)    Hyperlipidemia    Hyperplastic colonic polyp    Hypertension    Hypothyroidism    Obesity    Past Surgical History:  Procedure Laterality Date   ABDOMINAL HYSTERECTOMY     BREAST REDUCTION SURGERY  2002   BUNIONECTOMY     both feet   COLONOSCOPY  01/22/2014   CYST EXCISION  12/15/12   punch biopsy left shoulder blade   HAMMER TOE SURGERY     right 2nd toe   LAPAROSCOPIC ASSISTED VAGINAL HYSTERECTOMY  1992   with BSO   OOPHORECTOMY     TONSILLECTOMY      reports that  she has never smoked. She has never used smokeless tobacco. She reports that she does not currently use alcohol. She reports that she does not use drugs. family history includes Cancer in her father; Diabetes in her sister; Heart attack in her mother; Heart disease in her sister; Hyperlipidemia in her mother; Hypertension in her mother and sister; Stroke in her mother. Allergies  Allergen Reactions   Indomethacin     Took with ASA and caused severe bloating   Prevnar 13 [Pneumococcal 13-Val Conj Vacc]     Arm became very sore and pt developed "fiery red knot" at site   Tetanus Toxoid     Entire arm became swollen and turned red   Tetracycline     "hot, red knots" below knees   Current Outpatient Medications on File Prior to Visit  Medication Sig Dispense Refill   aspirin 81 MG tablet Take 81 mg by mouth daily.     cholecalciferol (VITAMIN D3) 25 MCG (1000 UNIT) tablet      Cyanocobalamin (VITAMIN B-12) 1000 MCG/15ML LIQD      meloxicam (MOBIC) 15 MG tablet TAKE 1 TABLET BY MOUTH DAILY  AS NEEDED FOR PAIN 90 tablet 1   polyethylene glycol (MIRALAX / GLYCOLAX) 17 g packet Take 17 g by mouth daily.     SYNTHROID 125 MCG tablet Take 1 tablet (125 mcg total) by mouth daily. 90 tablet 3   telmisartan-hydrochlorothiazide (MICARDIS HCT) 40-12.5 MG tablet Take 1 tablet by mouth daily. 90 tablet 3   traMADol (ULTRAM) 50 MG tablet TAKE ONE TABLET BY MOUTH EVERY NIGHT AT BEDTIME AS NEEDED FOR PAIN 90 tablet 1   atorvastatin (LIPITOR) 40 MG tablet Take 1 tablet (40 mg total) by mouth daily. (Patient not taking: Reported on 06/02/2023) 90 tablet 3   No current facility-administered medications on file prior to visit.        ROS:  All others reviewed and negative.  Objective        PE:  BP 122/80 (BP Location: Right Arm, Patient Position: Sitting, Cuff Size: Normal)   Pulse 65   Temp 98.7 F (37.1 C) (Oral)   Ht 5\' 5"  (1.651 m)   Wt 174 lb (78.9 kg)   LMP 09/13/1994   SpO2 97%   BMI 28.96 kg/m                  Constitutional: Pt appears in NAD               HENT: Head: NCAT.                Right Ear: External ear normal.                 Left Ear: External ear normal.                Eyes: . Pupils are equal, round, and reactive to light. Conjunctivae and EOM are normal               Nose: without d/c or deformity               Neck: Neck supple. Gross normal ROM               Cardiovascular: Normal rate and regular rhythm.                 Pulmonary/Chest: Effort normal and breath sounds without rales or wheezing.                Abd:  Soft, NT, ND, + BS, no organomegaly               Neurological: Pt is alert. At baseline orientation, motor grossly intact               Skin: Skin is warm. No rashes, no other new lesions, LE edema - none               Psychiatric: Pt behavior is normal without agitation   Micro: none  Cardiac tracings I have personally interpreted today:  none  Pertinent Radiological findings (summarize): none   Lab Results  Component Value Date   WBC 6.3 10/21/2022   HGB 13.9 10/21/2022   HCT 41.9 10/21/2022   PLT 139.0 (L) 10/21/2022   GLUCOSE 97 05/23/2023   CHOL 137 05/23/2023   TRIG 101 05/23/2023   HDL 53 05/23/2023   LDLCALC 65 05/23/2023   ALT 19 02/01/2023   AST 21 02/01/2023   NA 140 05/23/2023   K 4.9 05/23/2023   CL 98 05/23/2023   CREATININE 0.84 05/23/2023   BUN 18 05/23/2023   CO2 25  05/23/2023   TSH 1.05 02/01/2023   HGBA1C 5.8 02/01/2023   MICROALBUR <0.7 10/21/2022   Assessment/Plan:  Mary Ward is a 77 y.o. White or Caucasian [1] female with  has a past medical history of Allergy, EN (erythema nodosum), Hyperlipidemia, Hyperplastic colonic polyp, Hypertension, Hypothyroidism, and Obesity.  Essential hypertension BP Readings from Last 3 Encounters:  06/02/23 122/80  05/31/23 (!) 163/80  05/23/23 136/86   Stable, pt to continue medical treatment micardis hct   Hyperglycemia Lab Results  Component Value Date   HGBA1C  5.8 02/01/2023   Stable, pt to continue current medical treatment  - diet,w t control   Hyperlipidemia Lab Results  Component Value Date   LDLCALC 65 05/23/2023   Stable, pt to continue current statin lipitor 40 mg - ok to restart   Hypothyroidism Lab Results  Component Value Date   TSH 1.05 02/01/2023   Stable, pt to continue levothyroxine 125 mcg qd   Bursitis of left hip No more clinically overt and near resolved with prednisone, has declines cortisone injection or ortho f/u; ok for film today , prednisone taper, celebrex 200 bid prn, d/c mobic and advil, tramadol prn only and f/u ortho for any worsneing  Vitamin D deficiency Last vitamin D Lab Results  Component Value Date   VD25OH 42.43 02/01/2023   Stable, cont oral replacement   B12 deficiency Lab Results  Component Value Date   VITAMINB12 235 02/01/2023   Low, reminded to start oral replacement - b12 1000 mcg qd  Followup: Return if symptoms worsen or fail to improve.  Oliver Barre, MD 06/04/2023 3:49 PM [facility]

## 2023-06-04 ENCOUNTER — Encounter: Payer: Self-pay | Admitting: Internal Medicine

## 2023-06-04 NOTE — Assessment & Plan Note (Signed)
Last vitamin D Lab Results  Component Value Date   VD25OH 42.43 02/01/2023   Stable, cont oral replacement

## 2023-06-04 NOTE — Assessment & Plan Note (Signed)
Lab Results  Component Value Date   HGBA1C 5.8 02/01/2023   Stable, pt to continue current medical treatment  - diet, wt control

## 2023-06-04 NOTE — Assessment & Plan Note (Signed)
BP Readings from Last 3 Encounters:  06/02/23 122/80  05/31/23 (!) 163/80  05/23/23 136/86   Stable, pt to continue medical treatment micardis hct

## 2023-06-04 NOTE — Assessment & Plan Note (Signed)
Lab Results  Component Value Date   TSH 1.05 02/01/2023   Stable, pt to continue levothyroxine 125 mcg qd

## 2023-06-04 NOTE — Assessment & Plan Note (Signed)
No more clinically overt and near resolved with prednisone, has declines cortisone injection or ortho f/u; ok for film today , prednisone taper, celebrex 200 bid prn, d/c mobic and advil, tramadol prn only and f/u ortho for any worsneing

## 2023-06-04 NOTE — Assessment & Plan Note (Signed)
Lab Results  Component Value Date   LDLCALC 65 05/23/2023   Stable, pt to continue current statin lipitor 40 mg - ok to restart

## 2023-06-04 NOTE — Assessment & Plan Note (Signed)
Lab Results  Component Value Date   VITAMINB12 235 02/01/2023   Low, reminded to start oral replacement - b12 1000 mcg qd

## 2023-06-05 ENCOUNTER — Encounter: Payer: Self-pay | Admitting: Cardiology

## 2023-06-06 ENCOUNTER — Other Ambulatory Visit: Payer: Self-pay | Admitting: Cardiology

## 2023-06-06 ENCOUNTER — Ambulatory Visit (HOSPITAL_COMMUNITY)
Admission: RE | Admit: 2023-06-06 | Discharge: 2023-06-06 | Disposition: A | Discharge status: Home / Self Care | Payer: PPO | Source: Ambulatory Visit | Attending: Cardiology | Admitting: Cardiology

## 2023-06-06 DIAGNOSIS — M79606 Pain in leg, unspecified: Secondary | ICD-10-CM | POA: Diagnosis not present

## 2023-06-06 DIAGNOSIS — M79604 Pain in right leg: Secondary | ICD-10-CM

## 2023-06-06 DIAGNOSIS — M9901 Segmental and somatic dysfunction of cervical region: Secondary | ICD-10-CM | POA: Diagnosis not present

## 2023-06-06 DIAGNOSIS — I10 Essential (primary) hypertension: Secondary | ICD-10-CM | POA: Diagnosis not present

## 2023-06-06 DIAGNOSIS — I2510 Atherosclerotic heart disease of native coronary artery without angina pectoris: Secondary | ICD-10-CM | POA: Diagnosis not present

## 2023-06-06 DIAGNOSIS — R9431 Abnormal electrocardiogram [ECG] [EKG]: Secondary | ICD-10-CM

## 2023-06-06 DIAGNOSIS — M542 Cervicalgia: Secondary | ICD-10-CM | POA: Diagnosis not present

## 2023-06-06 DIAGNOSIS — M9900 Segmental and somatic dysfunction of head region: Secondary | ICD-10-CM | POA: Diagnosis not present

## 2023-06-06 DIAGNOSIS — I77819 Aortic ectasia, unspecified site: Secondary | ICD-10-CM

## 2023-06-06 DIAGNOSIS — G44201 Tension-type headache, unspecified, intractable: Secondary | ICD-10-CM | POA: Diagnosis not present

## 2023-06-07 LAB — VAS US ABI WITH/WO TBI
Left ABI: 1.18
Right ABI: 1.23

## 2023-06-08 DIAGNOSIS — G44201 Tension-type headache, unspecified, intractable: Secondary | ICD-10-CM | POA: Diagnosis not present

## 2023-06-08 DIAGNOSIS — M542 Cervicalgia: Secondary | ICD-10-CM | POA: Diagnosis not present

## 2023-06-08 DIAGNOSIS — M9900 Segmental and somatic dysfunction of head region: Secondary | ICD-10-CM | POA: Diagnosis not present

## 2023-06-08 DIAGNOSIS — M9901 Segmental and somatic dysfunction of cervical region: Secondary | ICD-10-CM | POA: Diagnosis not present

## 2023-06-13 DIAGNOSIS — M9901 Segmental and somatic dysfunction of cervical region: Secondary | ICD-10-CM | POA: Diagnosis not present

## 2023-06-13 DIAGNOSIS — M542 Cervicalgia: Secondary | ICD-10-CM | POA: Diagnosis not present

## 2023-06-13 DIAGNOSIS — M9900 Segmental and somatic dysfunction of head region: Secondary | ICD-10-CM | POA: Diagnosis not present

## 2023-06-13 DIAGNOSIS — G44201 Tension-type headache, unspecified, intractable: Secondary | ICD-10-CM | POA: Diagnosis not present

## 2023-06-15 DIAGNOSIS — G44201 Tension-type headache, unspecified, intractable: Secondary | ICD-10-CM | POA: Diagnosis not present

## 2023-06-15 DIAGNOSIS — M9901 Segmental and somatic dysfunction of cervical region: Secondary | ICD-10-CM | POA: Diagnosis not present

## 2023-06-15 DIAGNOSIS — M542 Cervicalgia: Secondary | ICD-10-CM | POA: Diagnosis not present

## 2023-06-15 DIAGNOSIS — M9900 Segmental and somatic dysfunction of head region: Secondary | ICD-10-CM | POA: Diagnosis not present

## 2023-06-20 ENCOUNTER — Ambulatory Visit (INDEPENDENT_AMBULATORY_CARE_PROVIDER_SITE_OTHER): Payer: PPO

## 2023-06-20 ENCOUNTER — Ambulatory Visit (INDEPENDENT_AMBULATORY_CARE_PROVIDER_SITE_OTHER): Payer: PPO | Admitting: Internal Medicine

## 2023-06-20 ENCOUNTER — Encounter: Payer: Self-pay | Admitting: Internal Medicine

## 2023-06-20 VITALS — BP 120/72 | HR 62 | Temp 98.3°F | Ht 65.0 in | Wt 175.0 lb

## 2023-06-20 DIAGNOSIS — M1712 Unilateral primary osteoarthritis, left knee: Secondary | ICD-10-CM | POA: Diagnosis not present

## 2023-06-20 DIAGNOSIS — M7072 Other bursitis of hip, left hip: Secondary | ICD-10-CM | POA: Diagnosis not present

## 2023-06-20 DIAGNOSIS — M25562 Pain in left knee: Secondary | ICD-10-CM

## 2023-06-20 DIAGNOSIS — Z23 Encounter for immunization: Secondary | ICD-10-CM

## 2023-06-20 DIAGNOSIS — E559 Vitamin D deficiency, unspecified: Secondary | ICD-10-CM | POA: Diagnosis not present

## 2023-06-20 DIAGNOSIS — E538 Deficiency of other specified B group vitamins: Secondary | ICD-10-CM | POA: Diagnosis not present

## 2023-06-20 MED ORDER — PREDNISONE 10 MG PO TABS: ORAL_TABLET | ORAL | 0 refills | Status: DC

## 2023-06-20 MED ORDER — ZOLPIDEM TARTRATE 5 MG PO TABS: 5.0000 mg | ORAL_TABLET | Freq: Every evening | ORAL | 1 refills | Status: DC | PRN

## 2023-06-20 NOTE — Patient Instructions (Signed)
Please take all new medication as prescribed - the prednisone, and the ambien as needed for sleep  Please continue all other medications as before, and refills have been done if requested.  Please have the pharmacy call with any other refills you may need.  Please keep your appointments with your specialists as you may have planned  Please go to the XRAY Department in the first floor for the x-ray testing  You will be contacted by phone if any changes need to be made immediately.  Otherwise, you will receive a letter about your results with an explanation, but please check with MyChart first.

## 2023-06-20 NOTE — Progress Notes (Unsigned)
Patient ID: Mary Ward, female   DOB: 1945/11/04, 77 y.o.   MRN: [id number]        Chief Complaint: follow up left hip bursitis, left knee djd with recent fall, insomnia,        HPI:  Mary Ward is a 77 y.o. female here with c/o improved but persistent left hip pain with recent prednisone taper, asks for repeat tx.  Pt denies chest pain, increased sob or doe, wheezing, orthopnea, PND, increased LE swelling, palpitations, dizziness or syncope.   Pt denies polydipsia, polyuria, or new focal neuro s/s.    Pt denies fever, wt loss, night sweats, loss of appetite, or other constitutional symptoms  did also have a fall to left knee yesterday with anterior knee pain, mild swelling.  Also with recent worsening insomnia, maybe stress related.           Wt Readings from Last 3 Encounters:  06/20/23 175 lb (79.4 kg)  06/02/23 174 lb (78.9 kg)  05/23/23 177 lb (80.3 kg)   BP Readings from Last 3 Encounters:  06/20/23 120/72  06/02/23 122/80  05/31/23 (!) 163/80         Past Medical History:  Diagnosis Date   Allergy    dust, dogs, grass   EN (erythema nodosum)    Hyperlipidemia    Hyperplastic colonic polyp    Hypertension    Hypothyroidism    Obesity    Past Surgical History:  Procedure Laterality Date   ABDOMINAL HYSTERECTOMY     BREAST REDUCTION SURGERY  2002   BUNIONECTOMY     both feet   COLONOSCOPY  01/22/2014   CYST EXCISION  12/15/12   punch biopsy left shoulder blade   HAMMER TOE SURGERY     right 2nd toe   LAPAROSCOPIC ASSISTED VAGINAL HYSTERECTOMY  1992   with BSO   OOPHORECTOMY     TONSILLECTOMY      reports that she has never smoked. She has never used smokeless tobacco. She reports that she does not currently use alcohol. She reports that she does not use drugs. family history includes Cancer in her father; Diabetes in her sister; Heart attack in her mother; Heart disease in her sister; Hyperlipidemia in her mother; Hypertension in her mother and sister; Stroke in her  mother. Allergies  Allergen Reactions   Indomethacin     Took with ASA and caused severe bloating   Prevnar 13 [Pneumococcal 13-Val Conj Vacc]     Arm became very sore and pt developed "fiery red knot" at site   Tetanus Toxoid     Entire arm became swollen and turned red   Tetracycline     "hot, red knots" below knees   Trazodone And Nefazodone Other (See Comments)    dizziness   Current Outpatient Medications on File Prior to Visit  Medication Sig Dispense Refill   aspirin 81 MG tablet Take 81 mg by mouth daily.     atorvastatin (LIPITOR) 40 MG tablet Take 1 tablet (40 mg total) by mouth daily. 90 tablet 3   celecoxib (CELEBREX) 200 MG capsule Take 1 capsule (200 mg total) by mouth 2 (two) times daily. 60 capsule 5   cholecalciferol (VITAMIN D3) 25 MCG (1000 UNIT) tablet      Cyanocobalamin (VITAMIN B-12) 1000 MCG/15ML LIQD      meloxicam (MOBIC) 15 MG tablet TAKE 1 TABLET BY MOUTH DAILY AS NEEDED FOR PAIN 90 tablet 1   polyethylene glycol (MIRALAX / GLYCOLAX) 17  g packet Take 17 g by mouth daily.     SYNTHROID 125 MCG tablet Take 1 tablet (125 mcg total) by mouth daily. 90 tablet 3   telmisartan-hydrochlorothiazide (MICARDIS HCT) 40-12.5 MG tablet Take 1 tablet by mouth daily. 90 tablet 3   traMADol (ULTRAM) 50 MG tablet TAKE ONE TABLET BY MOUTH EVERY NIGHT AT BEDTIME AS NEEDED FOR PAIN 90 tablet 1   No current facility-administered medications on file prior to visit.        ROS:  All others reviewed and negative.  Objective        PE:  BP 120/72 (BP Location: Right Arm, Patient Position: Sitting, Cuff Size: Normal)   Pulse 62   Temp 98.3 F (36.8 C) (Oral)   Ht 5\' 5"  (1.651 m)   Wt 175 lb (79.4 kg)   LMP 09/13/1994   SpO2 98%   BMI 29.12 kg/m                 Constitutional: Pt appears in NAD               HENT: Head: NCAT.                Right Ear: External ear normal.                 Left Ear: External ear normal.                Eyes: . Pupils are equal, round, and  reactive to light. Conjunctivae and EOM are normal               Nose: without d/c or deformity               Neck: Neck supple. Gross normal ROM               Cardiovascular: Normal rate and regular rhythm.                 Pulmonary/Chest: Effort normal and breath sounds without rales or wheezing.                Abd:  Soft, NT, ND, + BS, no organomegaly               Neurological: Pt is alert. At baseline orientation, motor grossly intact, tender over lef               Skin: Skin is warm. No rashes, no other new lesions, LE edema - none               Psychiatric: Pt behavior is normal without agitation   Micro: none  Cardiac tracings I have personally interpreted today:  none  Pertinent Radiological findings (summarize): none   Lab Results  Component Value Date   WBC 6.3 10/21/2022   HGB 13.9 10/21/2022   HCT 41.9 10/21/2022   PLT 139.0 (L) 10/21/2022   GLUCOSE 97 05/23/2023   CHOL 137 05/23/2023   TRIG 101 05/23/2023   HDL 53 05/23/2023   LDLCALC 65 05/23/2023   ALT 19 02/01/2023   AST 21 02/01/2023   NA 140 05/23/2023   K 4.9 05/23/2023   CL 98 05/23/2023   CREATININE 0.84 05/23/2023   BUN 18 05/23/2023   CO2 25 05/23/2023   TSH 1.05 02/01/2023   HGBA1C 5.8 02/01/2023   MICROALBUR <0.7 10/21/2022   Assessment/Plan:  Mary Ward is a 77 y.o. White or Caucasian [1] female with  has a past  medical history of Allergy, EN (erythema nodosum), Hyperlipidemia, Hyperplastic colonic polyp, Hypertension, Hypothyroidism, and Obesity.  Left knee pain With pain post fall, for xray r/o fx  Bursitis of left hip Improved, for repeat prednisone taper  Vitamin D deficiency Last vitamin D Lab Results  Component Value Date   VD25OH 42.43 02/01/2023   Stable, cont oral replacement   B12 deficiency Lab Results  Component Value Date   VITAMINB12 235 02/01/2023   Low, to start oral replacement - b12 1000 mcg qd   Followup: Return if symptoms worsen or fail to  improve.  Oliver Barre, MD 06/22/2023 9:05 PM [facility]

## 2023-06-21 ENCOUNTER — Ambulatory Visit: Payer: PPO

## 2023-06-21 ENCOUNTER — Encounter: Payer: Self-pay | Admitting: Internal Medicine

## 2023-06-22 NOTE — Assessment & Plan Note (Signed)
Lab Results  Component Value Date   VITAMINB12 235 02/01/2023   Low, to start oral replacement - b12 1000 mcg qd

## 2023-06-22 NOTE — Assessment & Plan Note (Signed)
Last vitamin D Lab Results  Component Value Date   VD25OH 42.43 02/01/2023   Stable, cont oral replacement

## 2023-06-22 NOTE — Assessment & Plan Note (Signed)
With pain post fall, for xray r/o fx

## 2023-06-22 NOTE — Assessment & Plan Note (Signed)
Improved, for repeat prednisone taper

## 2023-06-23 ENCOUNTER — Encounter: Payer: Self-pay | Admitting: Internal Medicine

## 2023-06-24 DIAGNOSIS — N281 Cyst of kidney, acquired: Secondary | ICD-10-CM | POA: Diagnosis not present

## 2023-06-24 DIAGNOSIS — K862 Cyst of pancreas: Secondary | ICD-10-CM | POA: Diagnosis not present

## 2023-06-24 DIAGNOSIS — N200 Calculus of kidney: Secondary | ICD-10-CM | POA: Diagnosis not present

## 2023-07-12 ENCOUNTER — Other Ambulatory Visit (INDEPENDENT_AMBULATORY_CARE_PROVIDER_SITE_OTHER): Payer: PPO

## 2023-07-12 DIAGNOSIS — E538 Deficiency of other specified B group vitamins: Secondary | ICD-10-CM | POA: Diagnosis not present

## 2023-07-12 DIAGNOSIS — E559 Vitamin D deficiency, unspecified: Secondary | ICD-10-CM

## 2023-07-12 DIAGNOSIS — R739 Hyperglycemia, unspecified: Secondary | ICD-10-CM

## 2023-07-12 LAB — LIPID PANEL
Cholesterol: 142 mg/dL (ref 0–200)
HDL: 44.9 mg/dL (ref >39.00)
LDL Cholesterol: 73 mg/dL (ref 0–99)
NonHDL: 97.48
Total CHOL/HDL Ratio: 3
Triglycerides: 122 mg/dL (ref 0.0–149.0)
VLDL: 24.4 mg/dL (ref 0.0–40.0)

## 2023-07-12 LAB — BASIC METABOLIC PANEL
BUN: 15 mg/dL (ref 6–23)
CO2: 30 meq/L (ref 19–32)
Calcium: 9.4 mg/dL (ref 8.4–10.5)
Chloride: 102 meq/L (ref 96–112)
Creatinine, Ser: 0.81 mg/dL (ref 0.40–1.20)
GFR: 70.75 mL/min (ref >60.00)
Glucose, Bld: 107 mg/dL — ABNORMAL HIGH (ref 70–99)
Potassium: 4.5 meq/L (ref 3.5–5.1)
Sodium: 140 meq/L (ref 135–145)

## 2023-07-12 LAB — HEPATIC FUNCTION PANEL
ALT: 14 U/L (ref 0–35)
AST: 15 U/L (ref 0–37)
Albumin: 3.9 g/dL (ref 3.5–5.2)
Alkaline Phosphatase: 76 U/L (ref 39–117)
Bilirubin, Direct: 0.1 mg/dL (ref 0.0–0.3)
Total Bilirubin: 0.6 mg/dL (ref 0.2–1.2)
Total Protein: 6.6 g/dL (ref 6.0–8.3)

## 2023-07-12 LAB — VITAMIN D 25 HYDROXY (VIT D DEFICIENCY, FRACTURES): VITD: 36.32 ng/mL (ref 30.00–100.00)

## 2023-07-12 LAB — HEMOGLOBIN A1C: Hgb A1c MFr Bld: 6.1 % (ref 4.6–6.5)

## 2023-07-12 LAB — VITAMIN B12: Vitamin B-12: 536 pg/mL (ref 211–911)

## 2023-07-13 ENCOUNTER — Encounter: Payer: Self-pay | Admitting: Internal Medicine

## 2023-07-13 DIAGNOSIS — E039 Hypothyroidism, unspecified: Secondary | ICD-10-CM

## 2023-07-14 ENCOUNTER — Ambulatory Visit (HOSPITAL_COMMUNITY)
Admission: RE | Admit: 2023-07-14 | Discharge: 2023-07-14 | Disposition: A | Discharge status: Home / Self Care | Payer: PPO | Source: Ambulatory Visit | Attending: Internal Medicine | Admitting: Internal Medicine

## 2023-07-14 ENCOUNTER — Encounter (HOSPITAL_COMMUNITY): Payer: Self-pay

## 2023-07-14 VITALS — BP 151/84 | HR 66 | Temp 98.0°F | Resp 18 | Ht 65.5 in | Wt 170.0 lb

## 2023-07-14 DIAGNOSIS — K5900 Constipation, unspecified: Secondary | ICD-10-CM

## 2023-07-14 NOTE — ED Triage Notes (Signed)
Patient c/o stomach discomfort x 1 week, history of diverticulitis.  Denies any nausea, vomiting or diarrhea.  Feels better today.  Patient has taken Prilosec x 6 days for some heartburn discomfort.

## 2023-07-14 NOTE — ED Provider Notes (Signed)
[facility]    CSN: [id number] Arrival date & time: 07/14/23  1511      History   Chief Complaint Chief Complaint  Patient presents with   Constipation    Concerned about flair up of diverticulitis.  Also stomach pain (burn) - ulcer? - Entered by patient    HPI Mary Ward is a 77 y.o. female.   Mary Ward is a 77 y.o. female presenting for chief complaint of generalized abdominal discomfort that started a couple of weeks ago after starting Ambien.  She states she felt bloated daily after starting this medication at night to help with insomnia.  She stopped taking it approximately 1 week ago and the abdominal discomfort has improved, however she then developed acid reflux symptoms and constipation.  She started taking Prilosec 6 days ago and states her symptoms have improved significantly.  She takes MiraLAX daily for constipation daily.  She did not have a normal bowel movement for a couple of days but was able to have 2-3 normal bowel movements this morning and states she feels much better.  No recent nausea vomiting, diarrhea, chest pain, shortness of breath, fever, chills, back pain, urinary symptoms, or dizziness.  History of diverticulosis with diverticulitis and is concerned for same.  Currently asymptomatic.   Constipation   Past Medical History:  Diagnosis Date   Allergy    dust, dogs, grass   EN (erythema nodosum)    Hyperlipidemia    Hyperplastic colonic polyp    Hypertension    Hypothyroidism    Obesity     Patient Active Problem List   Diagnosis Date Noted   Bursitis of left hip 06/02/2023   Vitamin D deficiency 02/09/2023   Left knee pain 02/08/2023   Acquired deformity of lower leg 08/09/2022   B12 deficiency 08/09/2022   Left lumbar radiculopathy 04/24/2022   Osteopenia 04/24/2022   Abdominal pain 01/19/2022   Allergic rhinitis 11/05/2020   Acute diverticulitis 04/11/2020   Right renal mass 04/11/2020   Insomnia 01/12/2020   Chest pain  11/05/2017   Cough 12/18/2015   Coronary artery calcification seen on CAT scan 12/19/2014   Hyperglycemia 12/19/2014   Encounter for well adult exam with abnormal findings 11/12/2011   MENOPAUSAL DISORDER 09/29/2009   ELECTROCARDIOGRAM, ABNORMAL 08/07/2008   GERD 08/06/2007   History of colonic polyps 08/06/2007   Hypothyroidism 05/07/2007   Hyperlipidemia 05/07/2007   OBESITY 05/07/2007   Anxiety with depression 05/07/2007   Essential hypertension 05/07/2007   Erythema nodosum 04/28/2007    Past Surgical History:  Procedure Laterality Date   ABDOMINAL HYSTERECTOMY     BREAST REDUCTION SURGERY  2002   BUNIONECTOMY     both feet   COLONOSCOPY  01/22/2014   CYST EXCISION  12/15/12   punch biopsy left shoulder blade   HAMMER TOE SURGERY     right 2nd toe   LAPAROSCOPIC ASSISTED VAGINAL HYSTERECTOMY  1992   with BSO   OOPHORECTOMY     TONSILLECTOMY      OB History     Gravida  0   Para      Term      Preterm      AB      Living         SAB      IAB      Ectopic      Multiple      Live Births  Home Medications    Prior to Admission medications   Medication Sig Start Date End Date Taking? Authorizing Provider  aspirin 81 MG tablet Take 81 mg by mouth daily.   Yes [provider]  atorvastatin (LIPITOR) 40 MG tablet Take 1 tablet (40 mg total) by mouth daily. 02/08/23  Yes Corwin Levins, MD  cholecalciferol (VITAMIN D3) 25 MCG (1000 UNIT) tablet  12/28/22  Yes [provider]  Cyanocobalamin (VITAMIN B-12) 1000 MCG/15ML LIQD  12/28/22  Yes [provider]  meloxicam (MOBIC) 15 MG tablet TAKE 1 TABLET BY MOUTH DAILY AS NEEDED FOR PAIN 05/18/23  Yes Corwin Levins, MD  polyethylene glycol (MIRALAX / GLYCOLAX) 17 g packet Take 17 g by mouth daily.   Yes [provider]  SYNTHROID 125 MCG tablet Take 1 tablet (125 mcg total) by mouth daily. 02/08/23  Yes Corwin Levins, MD  telmisartan-hydrochlorothiazide  (MICARDIS HCT) 40-12.5 MG tablet Take 1 tablet by mouth daily. 02/08/23  Yes Corwin Levins, MD  traMADol (ULTRAM) 50 MG tablet TAKE ONE TABLET BY MOUTH EVERY NIGHT AT BEDTIME AS NEEDED FOR PAIN 05/27/23  Yes Corwin Levins, MD  zolpidem (AMBIEN) 5 MG tablet Take 1 tablet (5 mg total) by mouth at bedtime as needed for sleep. 06/20/23  Yes Corwin Levins, MD  celecoxib (CELEBREX) 200 MG capsule Take 1 capsule (200 mg total) by mouth 2 (two) times daily. 06/02/23   Corwin Levins, MD  predniSONE (DELTASONE) 10 MG tablet 3 tabs by mouth per day for 3 days,2tabs per day for 3 days,1tab per day for 3 days 06/20/23   Corwin Levins, MD    Family History Family History  Problem Relation Age of Onset   Hyperlipidemia Mother    Stroke Mother    Hypertension Mother    Heart attack Mother    Cancer Father        Lung   Diabetes Sister    Heart disease Sister        CAD   Hypertension Sister    Colon cancer Neg Hx    Esophageal cancer Neg Hx    Rectal cancer Neg Hx    Stomach cancer Neg Hx    Colon polyps Neg Hx     Social History Social History   Tobacco Use   Smoking status: Never   Smokeless tobacco: Never  Vaping Use   Vaping status: Never Used  Substance Use Topics   Alcohol use: Not Currently    Alcohol/week: 0.0 standard drinks of alcohol   Drug use: No     Allergies   Indomethacin, Prevnar 13 [pneumococcal 13-val conj vacc], Tetanus toxoid, Tetracycline, and Trazodone and nefazodone   Review of Systems Review of Systems  Gastrointestinal:  Positive for constipation.  Per HPI   Physical Exam Triage Vital Signs ED Triage Vitals  Encounter Vitals Group     BP 07/14/23 1535 (!) 151/84     Systolic BP Percentile --      Diastolic BP Percentile --      Pulse Rate 07/14/23 1535 66     Resp 07/14/23 1535 18     Temp 07/14/23 1535 98 F (36.7 C)     Temp Source 07/14/23 1535 Oral     SpO2 07/14/23 1535 96 %     Weight 07/14/23 1537 170 lb (77.1 kg)     Height 07/14/23 1537  5' 5.5" (1.664 m)     Head Circumference --  Peak Flow --      Pain Score 07/14/23 1537 0     Pain Loc --      Pain Education --      Exclude from Growth Chart --    No data found.  Updated Vital Signs BP (!) 151/84 (BP Location: Right Arm)   Pulse 66   Temp 98 F (36.7 C) (Oral)   Resp 18   Ht 5' 5.5" (1.664 m)   Wt 170 lb (77.1 kg)   LMP 09/13/1994   SpO2 96%   BMI 27.86 kg/m   Visual Acuity Right Eye Distance:   Left Eye Distance:   Bilateral Distance:    Right Eye Near:   Left Eye Near:    Bilateral Near:     Physical Exam Vitals and nursing note reviewed.  Constitutional:      Appearance: She is not ill-appearing or toxic-appearing.  HENT:     Head: Normocephalic and atraumatic.     Right Ear: Hearing and external ear normal.     Left Ear: Hearing and external ear normal.     Nose: Nose normal.     Mouth/Throat:     Lips: Pink.  Eyes:     General: Lids are normal. Vision grossly intact. Gaze aligned appropriately.     Extraocular Movements: Extraocular movements intact.     Conjunctiva/sclera: Conjunctivae normal.  Cardiovascular:     Rate and Rhythm: Normal rate and regular rhythm.     Heart sounds: Normal heart sounds, S1 normal and S2 normal.  Pulmonary:     Effort: Pulmonary effort is normal. No respiratory distress.     Breath sounds: Normal breath sounds and air entry.  Abdominal:     General: Bowel sounds are normal.     Palpations: Abdomen is soft.     Tenderness: There is no abdominal tenderness. There is no right CVA tenderness, left CVA tenderness or guarding.  Musculoskeletal:     Cervical back: Neck supple.  Skin:    General: Skin is warm and dry.     Capillary Refill: Capillary refill takes less than 2 seconds.     Findings: No rash.  Neurological:     General: No focal deficit present.     Mental Status: She is alert and oriented to person, place, and time. Mental status is at baseline.     Cranial Nerves: No dysarthria or  facial asymmetry.  Psychiatric:        Mood and Affect: Mood normal.        Speech: Speech normal.        Behavior: Behavior normal.        Thought Content: Thought content normal.        Judgment: Judgment normal.      UC Treatments / Results  Labs (all labs ordered are listed, but only abnormal results are displayed) Labs Reviewed - No data to display  EKG   Radiology No results found.  Procedures Procedures (including critical care time)  Medications Ordered in UC Medications - No data to display  Initial Impression / Assessment and Plan / UC Course  I have reviewed the triage vital signs and the nursing notes.  Pertinent labs & imaging results that were available during my care of the patient were reviewed by me and considered in my medical decision making (see chart for details).   1.  Constipation Symptoms secondary to constipation that has now been relieved prior to arrival urgent care.  Low  suspicion for diverticulitis. Advised to continue drinking plenty of water and ensuring adequate fiber intake in diet.  Continue MiraLAX.  May follow-up with PCP as needed. Stop Ambien until follow-up with PCP next week.  Counseled patient on potential for adverse effects with medications prescribed/recommended today, strict ER and return-to-clinic precautions discussed, patient verbalized understanding.    Final Clinical Impressions(s) / UC Diagnoses   Final diagnoses:  Constipation, unspecified constipation type   Discharge Instructions   None    ED Prescriptions   None    PDMP not reviewed this encounter.   Carlisle Beers, [place] 07/14/23 1625

## 2023-07-15 ENCOUNTER — Other Ambulatory Visit: Payer: PPO

## 2023-07-15 DIAGNOSIS — E039 Hypothyroidism, unspecified: Secondary | ICD-10-CM | POA: Diagnosis not present

## 2023-07-16 LAB — THYROID PANEL WITH TSH
Free Thyroxine Index: 3.3 (ref 1.4–3.8)
T3 Uptake: 34 % (ref 22–35)
T4, Total: 9.8 ug/dL (ref 5.1–11.9)
TSH: 0.86 m[IU]/L (ref 0.40–4.50)

## 2023-07-18 ENCOUNTER — Other Ambulatory Visit: Payer: PPO

## 2023-07-20 ENCOUNTER — Encounter: Payer: Self-pay | Admitting: Internal Medicine

## 2023-07-20 ENCOUNTER — Ambulatory Visit: Payer: PPO | Admitting: Internal Medicine

## 2023-07-20 ENCOUNTER — Telehealth: Payer: Self-pay | Admitting: Internal Medicine

## 2023-07-20 ENCOUNTER — Telehealth: Payer: Self-pay

## 2023-07-20 ENCOUNTER — Ambulatory Visit: Payer: PPO

## 2023-07-20 VITALS — BP 122/76 | HR 67 | Temp 98.1°F | Ht 65.5 in | Wt 174.0 lb

## 2023-07-20 DIAGNOSIS — R1084 Generalized abdominal pain: Secondary | ICD-10-CM | POA: Diagnosis not present

## 2023-07-20 DIAGNOSIS — I10 Essential (primary) hypertension: Secondary | ICD-10-CM

## 2023-07-20 DIAGNOSIS — K5900 Constipation, unspecified: Secondary | ICD-10-CM | POA: Diagnosis not present

## 2023-07-20 DIAGNOSIS — R739 Hyperglycemia, unspecified: Secondary | ICD-10-CM

## 2023-07-20 DIAGNOSIS — E559 Vitamin D deficiency, unspecified: Secondary | ICD-10-CM

## 2023-07-20 DIAGNOSIS — F5101 Primary insomnia: Secondary | ICD-10-CM | POA: Diagnosis not present

## 2023-07-20 DIAGNOSIS — E039 Hypothyroidism, unspecified: Secondary | ICD-10-CM | POA: Diagnosis not present

## 2023-07-20 DIAGNOSIS — R109 Unspecified abdominal pain: Secondary | ICD-10-CM | POA: Diagnosis not present

## 2023-07-20 DIAGNOSIS — F418 Other specified anxiety disorders: Secondary | ICD-10-CM | POA: Diagnosis not present

## 2023-07-20 LAB — LIPASE: Lipase: 13 U/L (ref 11.0–59.0)

## 2023-07-20 LAB — AMYLASE: Amylase: 35 U/L (ref 27–131)

## 2023-07-20 NOTE — Telephone Encounter (Signed)
Ok for the senakot or miralax even up to twice per day as needed   thanks

## 2023-07-20 NOTE — Assessment & Plan Note (Signed)
With chronic stress related to husband emotional illness; pt to call for counseling appt with Mary Ward

## 2023-07-20 NOTE — Assessment & Plan Note (Signed)
Lab Results  Component Value Date   TSH 0.86 07/15/2023   Stable, pt to continue levothyroxine 125 mcg qd

## 2023-07-20 NOTE — Assessment & Plan Note (Signed)
Mild to mod, for restart ambien at bedtime prn, to f/u any worsening symptoms or concerns

## 2023-07-20 NOTE — Patient Instructions (Signed)
Ok to take the senakot or the miralax daily for constipation  Ok to increase the Vit D3 to 2000 units per day  Please consider calling for Counseling as you mentioned  Ok to restart the Ambien for sleep  Please continue all other medications as before, and refills have been done if requested.  Please have the pharmacy call with any other refills you may need.  Please continue your efforts at being more active, low cholesterol diet, and weight control.  You are otherwise up to date with prevention measures today.  Please keep your appointments with your specialists as you may have planned  Please go to the XRAY Department in the first floor for the x-ray testing  /Please go to the LAB at the blood drawing area for the tests to be done  You will be contacted by phone if any changes need to be made immediately.  Otherwise, you will receive a letter about your results with an explanation, but please check with MyChart first.

## 2023-07-20 NOTE — Progress Notes (Addendum)
Patient ID: Mary Ward, female   DOB: 1946/08/08, 77 y.o.   MRN: [id number]        Chief Complaint: follow up generalized abd pain constipation, low vit d, anxiety stress, insomnia       HPI:  Mary Ward is a 77 y.o. female here with c/o persistent stress with husband with anxiety depression and dependency on her;  has had 1 wk worsening generalized abd pain and constipation, though initially due to [place] so stopped this but not improved; has had some improveement with tylenol and advil prn, but Denies worsening reflux, dysphagia, n/v, fever or blood, and  Afraid to eat but no wt loss.  Pain persists depiste changes of types of food   Has improved with miralax.  Denies urinary symptoms such as dysuria, frequency, urgency, flank pain, hematuria or n/v, fever, chills.  Reent labs with normal cbc and LFTs  Taking 1000 u every day Vit D3.     Wt Readings from Last 3 Encounters:  07/20/23 174 lb (78.9 kg)  07/14/23 170 lb (77.1 kg)  06/20/23 175 lb (79.4 kg)   BP Readings from Last 3 Encounters:  07/20/23 122/76  07/14/23 (!) 151/84  06/20/23 120/72         Past Medical History:  Diagnosis Date   Allergy    dust, dogs, grass   EN (erythema nodosum)    Hyperlipidemia    Hyperplastic colonic polyp    Hypertension    Hypothyroidism    Obesity    Past Surgical History:  Procedure Laterality Date   ABDOMINAL HYSTERECTOMY     BREAST REDUCTION SURGERY  2002   BUNIONECTOMY     both feet   COLONOSCOPY  01/22/2014   CYST EXCISION  12/15/12   punch biopsy left shoulder blade   HAMMER TOE SURGERY     right 2nd toe   LAPAROSCOPIC ASSISTED VAGINAL HYSTERECTOMY  1992   with BSO   OOPHORECTOMY     TONSILLECTOMY      reports that she has never smoked. She has never used smokeless tobacco. She reports that she does not currently use alcohol. She reports that she does not use drugs. family history includes Cancer in her father; Diabetes in her sister; Heart attack in her mother; Heart disease  in her sister; Hyperlipidemia in her mother; Hypertension in her mother and sister; Stroke in her mother. Allergies  Allergen Reactions   Indomethacin     Took with ASA and caused severe bloating   Prevnar 13 [Pneumococcal 13-Val Conj Vacc]     Arm became very sore and pt developed "fiery red knot" at site   Tetanus Toxoid     Entire arm became swollen and turned red   Tetracycline     "hot, red knots" below knees   Trazodone And Nefazodone Other (See Comments)    dizziness   Current Outpatient Medications on File Prior to Visit  Medication Sig Dispense Refill   aspirin 81 MG tablet Take 81 mg by mouth daily.     atorvastatin (LIPITOR) 40 MG tablet Take 1 tablet (40 mg total) by mouth daily. 90 tablet 3   cholecalciferol (VITAMIN D3) 25 MCG (1000 UNIT) tablet      Cyanocobalamin (VITAMIN B-12) 1000 MCG/15ML LIQD      meloxicam (MOBIC) 15 MG tablet TAKE 1 TABLET BY MOUTH DAILY AS NEEDED FOR PAIN 90 tablet 1   omeprazole (PRILOSEC OTC) 20 MG tablet      polyethylene glycol (MIRALAX /  GLYCOLAX) 17 g packet Take 17 g by mouth daily.     SYNTHROID 125 MCG tablet Take 1 tablet (125 mcg total) by mouth daily. 90 tablet 3   telmisartan-hydrochlorothiazide (MICARDIS HCT) 40-12.5 MG tablet Take 1 tablet by mouth daily. 90 tablet 3   traMADol (ULTRAM) 50 MG tablet TAKE ONE TABLET BY MOUTH EVERY NIGHT AT BEDTIME AS NEEDED FOR PAIN 90 tablet 1   zolpidem (AMBIEN) 5 MG tablet Take 1 tablet (5 mg total) by mouth at bedtime as needed for sleep. 90 tablet 1   No current facility-administered medications on file prior to visit.        ROS:  All others reviewed and negative.  Objective        PE:  BP 122/76 (BP Location: Left Arm, Patient Position: Sitting, Cuff Size: Normal)   Pulse 67   Temp 98.1 F (36.7 C) (Oral)   Ht 5' 5.5" (1.664 m)   Wt 174 lb (78.9 kg)   LMP 09/13/1994   SpO2 98%   BMI 28.51 kg/m                 Constitutional: Pt appears in NAD               HENT: Head: NCAT.                 Right Ear: External ear normal.                 Left Ear: External ear normal.                Eyes: . Pupils are equal, round, and reactive to light. Conjunctivae and EOM are normal               Nose: without d/c or deformity               Neck: Neck supple. Gross normal ROM               Cardiovascular: Normal rate and regular rhythm.                 Pulmonary/Chest: Effort normal and breath sounds without rales or wheezing.                Abd:  Soft, NT, ND, + BS, no organomegaly               Neurological: Pt is alert. At baseline orientation, motor grossly intact               Skin: Skin is warm. No rashes, no other new lesions, LE edema - none               Psychiatric: Pt behavior is normal without agitation , marked anxietey stress  Micro: none  Cardiac tracings I have personally interpreted today:  none  Pertinent Radiological findings (summarize): none   Lab Results  Component Value Date   WBC 6.3 10/21/2022   HGB 13.9 10/21/2022   HCT 41.9 10/21/2022   PLT 139.0 (L) 10/21/2022   GLUCOSE 107 (H) 07/12/2023   CHOL 142 07/12/2023   TRIG 122.0 07/12/2023   HDL 44.90 07/12/2023   LDLCALC 73 07/12/2023   ALT 14 07/12/2023   AST 15 07/12/2023   NA 140 07/12/2023   K 4.5 07/12/2023   CL 102 07/12/2023   CREATININE 0.81 07/12/2023   BUN 15 07/12/2023   CO2 30 07/12/2023   TSH 0.86 07/15/2023   HGBA1C 6.1  07/12/2023   MICROALBUR <0.7 10/21/2022   Assessment/Plan:  Mary Ward is a 77 y.o. White or Caucasian [1] female with  has a past medical history of Allergy, EN (erythema nodosum), Hyperlipidemia, Hyperplastic colonic polyp, Hypertension, Hypothyroidism, and Obesity.  Hypothyroidism Lab Results  Component Value Date   TSH 0.86 07/15/2023   Stable, pt to continue levothyroxine 125 mcg qd   Abdominal pain Exam benign, likely consipation related, for acute abd and chest films, amylase, lipise, ua with labs, for miralax prn and or senakot  prn  Insomnia Mild to mod, for restart ambien at bedtime prn, to f/u any worsening symptoms or concerns  Vitamin D deficiency Last vitamin D Lab Results  Component Value Date   VD25OH 36.32 07/12/2023   Low, to inrease vit d3 oral replacement to 2000 u qd  Anxiety with depression With chronic stress related to husband emotional illness; pt to call for counseling appt with Bubba Camp  Followup: Return in 6 months (on 01/17/2024), or if symptoms worsen or fail to improve.  Oliver Barre, MD 07/20/2023 8:50 PM [facility] - [facility]

## 2023-07-20 NOTE — Assessment & Plan Note (Addendum)
Exam benign, likely consipation related, for acute abd and chest films, amylase, lipise, ua with labs, for miralax prn and or senakot prn

## 2023-07-20 NOTE — Addendum Note (Signed)
Addended by: Corwin Levins on: 07/20/2023 08:50 PM   Modules accepted: Orders

## 2023-07-20 NOTE — Assessment & Plan Note (Signed)
Last vitamin D Lab Results  Component Value Date   VD25OH 36.32 07/12/2023   Low, to inrease vit d3 oral replacement to 2000 u qd

## 2023-07-21 ENCOUNTER — Other Ambulatory Visit: Payer: PPO

## 2023-07-21 LAB — URINALYSIS, ROUTINE W REFLEX MICROSCOPIC
Bilirubin Urine: NEGATIVE
Hgb urine dipstick: NEGATIVE
Ketones, ur: NEGATIVE
Nitrite: NEGATIVE
RBC / HPF: NONE SEEN (ref 0–?)
Specific Gravity, Urine: 1.02 (ref 1.000–1.030)
Total Protein, Urine: NEGATIVE
Urine Glucose: NEGATIVE
Urobilinogen, UA: 0.2 (ref 0.0–1.0)
pH: 5.5 (ref 5.0–8.0)

## 2023-07-21 NOTE — Telephone Encounter (Signed)
error 

## 2023-07-21 NOTE — Telephone Encounter (Signed)
Called and let Pt know

## 2023-07-21 NOTE — Progress Notes (Signed)
The test results show that your current treatment is OK, as the tests are stable.  Please continue the same plan.  There is no other need for change of treatment or further evaluation based on these results, at this time.  thanks 

## 2023-08-03 ENCOUNTER — Other Ambulatory Visit: Payer: PPO

## 2023-08-08 ENCOUNTER — Ambulatory Visit: Payer: PPO | Admitting: Internal Medicine

## 2023-08-08 NOTE — Progress Notes (Signed)
The test results show that your current treatment is OK, as the tests are stable.  Please continue the same plan.  There is no other need for change of treatment or further evaluation based on these results, at this time.  thanks 

## 2023-08-26 ENCOUNTER — Ambulatory Visit: Payer: PPO

## 2023-08-31 ENCOUNTER — Ambulatory Visit: Payer: PPO

## 2023-08-31 VITALS — HR 65 | Temp 97.8°F | Ht 65.5 in | Wt 178.8 lb

## 2023-08-31 DIAGNOSIS — Z0000 Encounter for general adult medical examination without abnormal findings: Secondary | ICD-10-CM

## 2023-08-31 NOTE — Patient Instructions (Signed)
[name] , Thank you for taking time to come for your Medicare Wellness Visit. I appreciate your ongoing commitment to your health goals. Please review the following plan we discussed and let me know if I can assist you in the future.   Referrals/Orders/Follow-Ups/Clinician Recommendations: Yes; Client understands the importance of follow-up appointments with providers by attending scheduled visits and discussed goals to eat healthier, increase physical activity 5 times a week for 30 minutes each, exercise the brain by doing stimulating brain exercises (reading, adult coloring, crafting, listening to music, puzzles, etc.), socialize and enjoy life more, get enough sleep at least 8-9 hours average per night and make time for laughter.  This is a list of the screening recommended for you and due dates:  Health Maintenance  Topic Date Due   Medicare Annual Wellness Visit  08/30/2024   Colon Cancer Screening  04/04/2026   Pneumonia Vaccine  Completed   Flu Shot  Completed   DEXA scan (bone density measurement)  Completed   Hepatitis C Screening  Completed   Zoster (Shingles) Vaccine  Completed   HPV Vaccine  Aged Out   DTaP/Tdap/Td vaccine  Discontinued   COVID-19 Vaccine  Discontinued    Advanced directives: (Copy Requested) Please bring a copy of your health care power of attorney and living will to the office to be added to your chart at your convenience.  Next Medicare Annual Wellness Visit scheduled for next year: No

## 2023-08-31 NOTE — Progress Notes (Addendum)
Subjective:   Mary Ward is a 77 y.o. female who presents for Medicare Annual (Subsequent) preventive examination.  Visit Complete: In person  Patient Medicare AWV questionnaire was completed by the patient on 08/27/2023; I have confirmed that all information answered by patient is correct and no changes since this date.  Cardiac Risk Factors include: advanced age (>32men, >21 women);dyslipidemia;hypertension;sedentary lifestyle;family history of premature cardiovascular disease  Depression screening was completed on date of service 08/31/2023 by this nurse.         Objective:    Today's Vitals   08/31/23 1452  Pulse: 65  Temp: 97.8 F (36.6 C)  TempSrc: Temporal  Weight: 178 lb 12.8 oz (81.1 kg)  Height: 5' 5.5" (1.664 m)  PainSc: 0-No pain   Body mass index is 29.3 kg/m.     08/31/2023    3:51 PM 08/19/2022   11:55 AM 08/21/2021   11:45 AM 07/07/2020    2:07 PM 04/07/2020    9:18 AM 01/08/2014   11:05 AM  Advanced Directives  Does Patient Have a Medical Advance Directive? Yes No Yes Yes No Patient has advance directive, copy not in chart  Type of Advance Directive Healthcare Power of [place];Living will  Living will;Healthcare Power of [employer] Power of [place];Living will    Does patient want to make changes to medical advance directive?   No - Patient declined No - Patient declined    Copy of Healthcare Power of Attorney in Chart? No - copy requested  No - copy requested No - copy requested    Would patient like information on creating a medical advance directive?  No - Patient declined        Current Medications (verified) Outpatient Encounter Medications as of 08/31/2023  Medication Sig   aspirin 81 MG tablet Take 81 mg by mouth daily.   atorvastatin (LIPITOR) 40 MG tablet Take 1 tablet (40 mg total) by mouth daily.   cholecalciferol (VITAMIN D3) 25 MCG (1000 UNIT) tablet    Cyanocobalamin (VITAMIN B-12) 1000 MCG/15ML LIQD    meloxicam (MOBIC)  15 MG tablet TAKE 1 TABLET BY MOUTH DAILY AS NEEDED FOR PAIN   polyethylene glycol (MIRALAX / GLYCOLAX) 17 g packet Take 17 g by mouth daily.   SYNTHROID 125 MCG tablet Take 1 tablet (125 mcg total) by mouth daily.   telmisartan-hydrochlorothiazide (MICARDIS HCT) 40-12.5 MG tablet Take 1 tablet by mouth daily.   traMADol (ULTRAM) 50 MG tablet TAKE ONE TABLET BY MOUTH EVERY NIGHT AT BEDTIME AS NEEDED FOR PAIN   [DISCONTINUED] omeprazole (PRILOSEC OTC) 20 MG tablet    [DISCONTINUED] zolpidem (AMBIEN) 5 MG tablet Take 1 tablet (5 mg total) by mouth at bedtime as needed for sleep.   No facility-administered encounter medications on file as of 08/31/2023.    Allergies (verified) Indomethacin, Prevnar 13 [pneumococcal 13-val conj vacc], Tetanus toxoid, Tetracycline, and Trazodone and nefazodone   History: Past Medical History:  Diagnosis Date   Allergy    dust, dogs, grass   EN (erythema nodosum)    Hyperlipidemia    Hyperplastic colonic polyp    Hypertension    Hypothyroidism    Obesity    Past Surgical History:  Procedure Laterality Date   ABDOMINAL HYSTERECTOMY     BREAST REDUCTION SURGERY  2002   BUNIONECTOMY     both feet   COLONOSCOPY  01/22/2014   CYST EXCISION  12/15/12   punch biopsy left shoulder blade   HAMMER TOE SURGERY  right 2nd toe   LAPAROSCOPIC ASSISTED VAGINAL HYSTERECTOMY  1992   with BSO   OOPHORECTOMY     TONSILLECTOMY     Family History  Problem Relation Age of Onset   Hyperlipidemia Mother    Stroke Mother    Hypertension Mother    Heart attack Mother    Cancer Father        Lung   Diabetes Sister    Heart disease Sister        CAD   Hypertension Sister    Colon cancer Neg Hx    Esophageal cancer Neg Hx    Rectal cancer Neg Hx    Stomach cancer Neg Hx    Colon polyps Neg Hx    Social History   Socioeconomic History   Marital status: Married    Spouse name: Not on file   Number of children: 0   Years of education: Not on file    Highest education level: Some college, no degree  Occupational History   Occupation: Optometrist: [employer]  Tobacco Use   Smoking status: Never   Smokeless tobacco: Never  Vaping Use   Vaping status: Never Used  Substance and Sexual Activity   Alcohol use: Not Currently    Alcohol/week: 0.0 standard drinks of alcohol   Drug use: No   Sexual activity: Not Currently    Birth control/protection: Post-menopausal, Surgical    Comment: Hysterectomy  Other Topics Concern   Not on file  Social History Narrative   Family history of high Cholesterol   Social Drivers of Health   Financial Resource Strain: Low Risk  (08/31/2023)   Overall Financial Resource Strain (CARDIA)    Difficulty of Paying Living Expenses: Not hard at all  Food Insecurity: No Food Insecurity (08/31/2023)   Hunger Vital Sign    Worried About Running Out of Food in the Last Year: Never true    Ran Out of Food in the Last Year: Never true  Transportation Needs: No Transportation Needs (08/31/2023)   PRAPARE - Administrator, Civil Service (Medical): No    Lack of Transportation (Non-Medical): No  Physical Activity: Insufficiently Active (08/31/2023)   Exercise Vital Sign    Days of Exercise per Week: 3 days    Minutes of Exercise per Session: 40 min  Stress: No Stress Concern Present (08/31/2023)   [employer] of Occupational Health - Occupational Stress Questionnaire    Feeling of Stress : Only a little  Recent Concern: Stress - Stress Concern Present (07/16/2023)   [employer] of Occupational Health - Occupational Stress Questionnaire    Feeling of Stress : To some extent  Social Connections: Socially Integrated (08/31/2023)   Social Connection and Isolation Panel [NHANES]    Frequency of Communication with Friends and Family: More than three times a week    Frequency of Social Gatherings with Friends and Family: More than three times a week    Attends  Religious Services: 1 to 4 times per year    Active Member of [employer] or Organizations: Yes    Attends Banker Meetings: 1 to 4 times per year    Marital Status: Married    Tobacco Counseling Counseling given: Not Answered   Clinical Intake:  Pre-visit preparation completed: Yes  Pain : No/denies pain Pain Score: 0-No pain     BMI - recorded: 29.3 Nutritional Status: BMI 25 -29 Overweight Nutritional Risks: None Diabetes: No  How often do you need to have someone help you when you read instructions, pamphlets, or other written materials from your doctor or pharmacy?: 1 - Never What is the last grade level you completed in school?: SOME COLLEGE  Interpreter Needed?: No  Information entered by :: Brand Males Falesha Schommer, LPN.   Activities of Daily Living    08/31/2023    2:56 PM 08/27/2023    9:34 AM  In your present state of health, do you have any difficulty performing the following activities:  Hearing? 0 0  Vision? 0 0  Difficulty concentrating or making decisions? 0 0  Walking or climbing stairs? 0 0  Dressing or bathing? 0 0  Doing errands, shopping? 0 0  Preparing Food and eating ? N N  Using the Toilet? N N  In the past six months, have you accidently leaked urine? N N  Do you have problems with loss of bowel control? N N  Managing your Medications? N N  Managing your Finances? N N  Housekeeping or managing your Housekeeping? N N    Patient Care Team: Corwin Levins, MD as PCP - General Little Ishikawa, MD as PCP - Cardiology (Cardiology) Linna Darner, RD as Dietitian (Family Medicine) Blima Ledger, OD as Consulting Physician (Optometry) Richardean Chimera, MD as Consulting Physician (Obstetrics and Gynecology)  Indicate any recent Medical Services you may have received from other than [facility] providers in the past year (date may be approximate).     Assessment:   This is a routine wellness examination for Theckla.  Hearing/Vision  screen Hearing Screening - Comments:: Denies hearing difficulties.   Vision Screening - Comments:: Wears rx glasses - up to date with routine eye exams with Blima Ledger, OD.    Goals Addressed             This Visit's Progress    Client understands the importance of follow-up with providers by attending scheduled visits        Depression Screen: Depression screening was completed on date of service 08/31/2023 by this nurse. No issues with depression.       07/20/2023    1:38 PM 06/02/2023   10:04 AM 02/08/2023    8:11 AM 08/23/2022   10:03 AM 08/09/2022    8:04 AM 04/22/2022    2:05 PM 02/04/2022    8:17 AM  PHQ 2/9 Scores  PHQ - 2 Score 0 0 0 0 0 0 0  PHQ- 9 Score 0 0 0 0 0 0     Fall Risk    08/31/2023    2:56 PM 08/27/2023    9:34 AM 07/20/2023    1:37 PM 06/02/2023   10:03 AM 02/08/2023    8:11 AM  Fall Risk   Falls in the past year? 1 1 0 1 0  Number falls in past yr: 0 0 0 0 0  Injury with Fall? 1 1 0 0 0  Risk for fall due to :   No Fall Risks History of fall(s) No Fall Risks  Follow up Falls evaluation completed;Education provided  Falls evaluation completed Falls evaluation completed Falls evaluation completed    MEDICARE RISK AT HOME: Medicare Risk at Home Any stairs in or around the home?: Yes If so, are there any without handrails?: No Home free of loose throw rugs in walkways, pet beds, electrical cords, etc?: Yes Adequate lighting in your home to reduce risk of falls?: Yes Life alert?: No Use of a cane, walker  or w/c?: No Grab bars in the bathroom?: Yes Shower chair or bench in shower?: Yes Elevated toilet seat or a handicapped toilet?: Yes  TIMED UP AND GO:  Was the test performed?  Yes  Length of time to ambulate 10 feet: 10 sec Gait steady and fast without use of assistive device    Cognitive Function:    08/31/2023    3:52 PM  MMSE - Mini Mental State Exam  Orientation to time 5  Orientation to Place 5  Registration 3  Attention/  Calculation 5  Recall 3  Language- name 2 objects 2  Language- repeat 1  Language- follow 3 step command 3  Language- read & follow direction 1  Write a sentence 1  Copy design 1  Total score 30        08/31/2023    2:56 PM 08/23/2022   10:05 AM  6CIT Screen  What Year? 0 points 0 points  What month? 0 points 0 points  What time? 0 points 0 points  Count back from 20 0 points 0 points  Months in reverse 0 points 0 points  Repeat phrase 0 points 0 points  Total Score 0 points 0 points    Immunizations Immunization History  Administered Date(s) Administered   Fluad Quad(high Dose 65+) 06/23/2019, 07/02/2020, 06/26/2021, 06/21/2022   Fluad Trivalent(High Dose 65+) 06/20/2023   Influenza Whole 07/30/1999, 07/14/2006, 07/03/2007, 06/13/2008, 06/13/2009   Influenza, High Dose Seasonal PF 06/23/2018   Influenza, Seasonal, Injecte, Preservative Fre 06/13/2013   Influenza-Unspecified 06/20/2014, 06/19/2019, 06/18/2020   PFIZER(Purple Top)SARS-COV-2 Vaccination 10/18/2019, 11/12/2019, 06/09/2020   Pfizer Covid-19 Vaccine Bivalent Booster 90yrs & up 06/04/2021   Pneumococcal Conjugate-13 11/16/2013   Pneumococcal Polysaccharide-23 08/07/2007, 11/15/2012   Pneumococcal-Unspecified 03/02/2018   Td 09/13/1994   Unspecified SARS-COV-2 Vaccination 06/04/2021   Varicella 02/[birth year]/1952   Zoster Recombinant(Shingrix) 03/21/2019, 05/22/2019   Zoster, Live 01/01/2008, 05/29/2019    TDAP status: Due, Education has been provided regarding the importance of this vaccine. Advised may receive this vaccine at local pharmacy or Health Dept. Aware to provide a copy of the vaccination record if obtained from local pharmacy or Health Dept. Verbalized acceptance and understanding.  Flu Vaccine status: Up to date  Pneumococcal vaccine status: Up to date  Covid-19 vaccine status: Completed vaccines  Qualifies for Shingles Vaccine? Yes   Zostavax completed Yes   Shingrix Completed?:  Yes  Screening Tests Health Maintenance  Topic Date Due   Medicare Annual Wellness (AWV)  08/30/2024   Colonoscopy  04/04/2026   Pneumonia Vaccine 46+ Years old  Completed   INFLUENZA VACCINE  Completed   DEXA SCAN  Completed   Hepatitis C Screening  Completed   Zoster Vaccines- Shingrix  Completed   HPV VACCINES  Aged Out   DTaP/Tdap/Td  Discontinued   COVID-19 Vaccine  Discontinued    Health Maintenance  There are no preventive care reminders to display for this patient.   Colorectal cancer screening: Type of screening: Colonoscopy. Completed 04/05/2019. Repeat every 7 years  Mammogram status: Completed 12/2022 with [facility]. Repeat every year  Bone Density status: Completed 02/05/2022. Results reflect: Bone density results: OSTEOPENIA. Repeat every 2-3 years.  Lung Cancer Screening: (Low Dose CT Chest recommended if Age 55-80 years, 20 pack-year currently smoking OR have quit w/in 15years.) does not qualify.   Lung Cancer Screening Referral: no  Additional Screening:  Hepatitis C Screening: does qualify; Completed 12/18/2015  Vision Screening: Recommended annual ophthalmology exams for early detection of glaucoma and  other disorders of the eye. Is the patient up to date with their annual eye exam?  Yes  Who is the provider or what is the name of the office in which the patient attends annual eye exams? Blima Ledger, OD. If pt is not established with a provider, would they like to be referred to a provider to establish care? No .   Dental Screening: Recommended annual dental exams for proper oral hygiene  Community Resource Referral / Chronic Care Management: CRR required this visit?  No   CCM required this visit?  No     Plan:     I have personally reviewed and noted the following in the patient's chart:   Medical and social history Use of alcohol, tobacco or illicit drugs  Current medications and supplements including opioid prescriptions. Patient is  not currently taking opioid prescriptions. Functional ability and status Nutritional status Physical activity Advanced directives List of other physicians Hospitalizations, surgeries, and ER visits in previous 12 months Vitals Screenings to include cognitive, depression, and falls Referrals and appointments  In addition, I have reviewed and discussed with patient certain preventive protocols, quality metrics, and best practice recommendations. A written personalized care plan for preventive services as well as general preventive health recommendations were provided to patient.     Mickeal Needy, LPN   16/06/[id number]   After Visit Summary: MyChart per patient.  Nurse Notes: Depression screening was completed on date of service 08/31/2023 by this nurse.

## 2023-09-01 ENCOUNTER — Telehealth: Payer: Self-pay | Admitting: Cardiology

## 2023-09-01 NOTE — Telephone Encounter (Signed)
Spoke to patient she stated she has been having left shoulder blade pain off and on for the past couple of months.Stated for the past 1 week seems worse.No pain at present.No chest pain.No sob. Stated she has appointment with PCP 12/26.Appointment scheduled with Dr.Schumann 1/9 at 2:40 pm.Advised if pain worsens she will need to go to ED.

## 2023-09-01 NOTE — Telephone Encounter (Signed)
  Pt c/o of Chest Pain: STAT if active CP, including tightness, pressure, jaw pain, radiating pain to shoulder/upper arm/back, CP unrelieved by Nitro. Symptoms reported of SOB, nausea, vomiting, sweating.  1. Are you having CP right now?   Yes  2. Are you experiencing any other symptoms (ex. SOB, nausea, vomiting, sweating)?   No  3. Is your CP continuous or coming and going?   Come and goes  4. Have you taken Nitroglycerin? No  5. How Melito have you been experiencing CP?   Patient stated pain started back in September  6. If NO CP at time of call then end call with telling Pt to call back or call 911 if Chest pain returns prior to return call from triage team.   Patient stated she has been having some pain in her back and through to her shoulder.

## 2023-09-02 ENCOUNTER — Ambulatory Visit: Payer: PPO | Admitting: Family Medicine

## 2023-09-08 ENCOUNTER — Ambulatory Visit (INDEPENDENT_AMBULATORY_CARE_PROVIDER_SITE_OTHER): Payer: PPO

## 2023-09-08 ENCOUNTER — Ambulatory Visit (INDEPENDENT_AMBULATORY_CARE_PROVIDER_SITE_OTHER): Payer: PPO | Admitting: Internal Medicine

## 2023-09-08 ENCOUNTER — Encounter: Payer: Self-pay | Admitting: Internal Medicine

## 2023-09-08 VITALS — BP 128/80 | HR 78 | Temp 99.0°F | Ht 65.5 in | Wt 180.0 lb

## 2023-09-08 DIAGNOSIS — M25562 Pain in left knee: Secondary | ICD-10-CM

## 2023-09-08 DIAGNOSIS — M25552 Pain in left hip: Secondary | ICD-10-CM | POA: Diagnosis not present

## 2023-09-08 DIAGNOSIS — M7072 Other bursitis of hip, left hip: Secondary | ICD-10-CM

## 2023-09-08 DIAGNOSIS — R1012 Left upper quadrant pain: Secondary | ICD-10-CM | POA: Diagnosis not present

## 2023-09-08 DIAGNOSIS — R051 Acute cough: Secondary | ICD-10-CM | POA: Diagnosis not present

## 2023-09-08 DIAGNOSIS — R739 Hyperglycemia, unspecified: Secondary | ICD-10-CM

## 2023-09-08 DIAGNOSIS — R1900 Intra-abdominal and pelvic swelling, mass and lump, unspecified site: Secondary | ICD-10-CM

## 2023-09-08 DIAGNOSIS — R918 Other nonspecific abnormal finding of lung field: Secondary | ICD-10-CM | POA: Diagnosis not present

## 2023-09-08 DIAGNOSIS — R1032 Left lower quadrant pain: Secondary | ICD-10-CM | POA: Diagnosis not present

## 2023-09-08 DIAGNOSIS — M79652 Pain in left thigh: Secondary | ICD-10-CM | POA: Diagnosis not present

## 2023-09-08 DIAGNOSIS — G8929 Other chronic pain: Secondary | ICD-10-CM

## 2023-09-08 DIAGNOSIS — M160 Bilateral primary osteoarthritis of hip: Secondary | ICD-10-CM | POA: Diagnosis not present

## 2023-09-08 DIAGNOSIS — I10 Essential (primary) hypertension: Secondary | ICD-10-CM | POA: Diagnosis not present

## 2023-09-08 DIAGNOSIS — F5101 Primary insomnia: Secondary | ICD-10-CM

## 2023-09-08 LAB — BASIC METABOLIC PANEL
BUN: 20 mg/dL (ref 6–23)
CO2: 29 meq/L (ref 19–32)
Calcium: 9.1 mg/dL (ref 8.4–10.5)
Chloride: 102 meq/L (ref 96–112)
Creatinine, Ser: 0.74 mg/dL (ref 0.40–1.20)
GFR: 78.76 mL/min (ref >60.00)
Glucose, Bld: 100 mg/dL — ABNORMAL HIGH (ref 70–99)
Potassium: 4.3 meq/L (ref 3.5–5.1)
Sodium: 138 meq/L (ref 135–145)

## 2023-09-08 LAB — CBC WITH DIFFERENTIAL/PLATELET
Basophils Absolute: 0.1 10*3/uL (ref 0.0–0.1)
Basophils Relative: 0.8 % (ref 0.0–3.0)
Eosinophils Absolute: 0.5 10*3/uL (ref 0.0–0.7)
Eosinophils Relative: 6.2 % — ABNORMAL HIGH (ref 0.0–5.0)
HCT: 44.2 % (ref 36.0–46.0)
Hemoglobin: 14.7 g/dL (ref 12.0–15.0)
Lymphocytes Relative: 21.7 % (ref 12.0–46.0)
Lymphs Abs: 1.8 10*3/uL (ref 0.7–4.0)
MCHC: 33.2 g/dL (ref 30.0–36.0)
MCV: 86.5 fL (ref 78.0–100.0)
Monocytes Absolute: 0.6 10*3/uL (ref 0.1–1.0)
Monocytes Relative: 7.7 % (ref 3.0–12.0)
Neutro Abs: 5.3 10*3/uL (ref 1.4–7.7)
Neutrophils Relative %: 63.6 % (ref 43.0–77.0)
Platelets: 167 10*3/uL (ref 150.0–400.0)
RBC: 5.12 Mil/uL — ABNORMAL HIGH (ref 3.87–5.11)
RDW: 13.4 % (ref 11.5–15.5)
WBC: 8.3 10*3/uL (ref 4.0–10.5)

## 2023-09-08 LAB — HEPATIC FUNCTION PANEL
ALT: 14 U/L (ref 0–35)
AST: 17 U/L (ref 0–37)
Albumin: 4.3 g/dL (ref 3.5–5.2)
Alkaline Phosphatase: 95 U/L (ref 39–117)
Bilirubin, Direct: 0.1 mg/dL (ref 0.0–0.3)
Total Bilirubin: 0.4 mg/dL (ref 0.2–1.2)
Total Protein: 7 g/dL (ref 6.0–8.3)

## 2023-09-08 LAB — HEMOGLOBIN A1C: Hgb A1c MFr Bld: 6.3 % (ref 4.6–6.5)

## 2023-09-08 MED ORDER — TEMAZEPAM 15 MG PO CAPS: ORAL_CAPSULE | ORAL | 2 refills | Status: DC

## 2023-09-08 NOTE — Patient Instructions (Addendum)
Please make your Appt with Sports Medicine on the first floor for the left groin and leg pain, as well as left knee pain  Ok to change the ambien to temazepam as needed  Please continue all other medications as before, and refills have been done if requested.  Please have the pharmacy call with any other refills you may need.  Please continue your efforts at being more active, low cholesterol diet, and weight control.  Please keep your appointments with your specialists as you may have planned  Please go to the XRAY Department in the first floor for the x-ray testing - for the hips and the cxr  Please go to the LAB at the blood drawing area for the tests to be done  You will be contacted regarding the referral for: MRI for the abdomen  You will be contacted by phone if any changes need to be made immediately.  Otherwise, you will receive a letter about your results with an explanation, but please check with MyChart first

## 2023-09-08 NOTE — Progress Notes (Signed)
Patient ID: Mary Ward, female   DOB: Dec 31, 1945, 77 y.o.   MRN: [id number]        Chief Complaint: follow up luq pain, pancreas lesion hx, left knee ain, left lateral hip pain and soreness, left groin pain, mild URI symptoms, insomnia       HPI:  Mary Ward is a 77 y.o. female here with multiple issues, first is somewhat new onset vague luq pain dull pressure like constant, has hx of pancreas lesion due soon for f/u MRI, also with mild intermittent left patellar tenderness without swelling, sore tender over left lateral hip, also with pain to left medial upper leg with walking worse in last few weeks, mild URI symptoms mostly cough temp 99 and chest congestion, also worsening insomnia where ambien no longer working well.  Pt denies chest pain, increased sob or doe, wheezing, orthopnea, PND, increased LE swelling, palpitations, dizziness or syncope.   Pt denies polydipsia, polyuria, or new focal neuro s/s.         Wt Readings from Last 3 Encounters:  09/08/23 180 lb (81.6 kg)  08/31/23 178 lb 12.8 oz (81.1 kg)  07/20/23 174 lb (78.9 kg)   BP Readings from Last 3 Encounters:  09/08/23 128/80  07/20/23 122/76  07/14/23 (!) 151/84         Past Medical History:  Diagnosis Date   Allergy    dust, dogs, grass   EN (erythema nodosum)    Hyperlipidemia    Hyperplastic colonic polyp    Hypertension    Hypothyroidism    Obesity    Past Surgical History:  Procedure Laterality Date   BREAST REDUCTION SURGERY  2002   BUNIONECTOMY     both feet   COLONOSCOPY  01/22/2014   CYST EXCISION  12/15/2012   punch biopsy left shoulder blade   HAMMER TOE SURGERY     right 2nd toe   LAPAROSCOPIC ASSISTED VAGINAL HYSTERECTOMY  1992   with BSO   OOPHORECTOMY     TONSILLECTOMY      reports that she has never smoked. She has never used smokeless tobacco. She reports that she does not currently use alcohol. She reports that she does not use drugs. family history includes Cancer in her father;  Diabetes in her sister; Heart attack in her mother; Heart disease in her sister; Hyperlipidemia in her mother; Hypertension in her mother and sister; Stroke in her mother. Allergies  Allergen Reactions   Indomethacin     Took with ASA and caused severe bloating   Prevnar 13 [Pneumococcal 13-Val Conj Vacc]     Arm became very sore and pt developed "fiery red knot" at site   Tetanus Toxoid     Entire arm became swollen and turned red   Tetracycline     "hot, red knots" below knees   Trazodone And Nefazodone Other (See Comments)    dizziness   Current Outpatient Medications on File Prior to Visit  Medication Sig Dispense Refill   aspirin 81 MG tablet Take 81 mg by mouth daily.     atorvastatin (LIPITOR) 40 MG tablet Take 1 tablet (40 mg total) by mouth daily. 90 tablet 3   cholecalciferol (VITAMIN D3) 25 MCG (1000 UNIT) tablet      Cyanocobalamin (VITAMIN B-12) 1000 MCG/15ML LIQD      meloxicam (MOBIC) 15 MG tablet TAKE 1 TABLET BY MOUTH DAILY AS NEEDED FOR PAIN 90 tablet 1   polyethylene glycol (MIRALAX / GLYCOLAX) 17 g packet  Take 17 g by mouth daily.     SYNTHROID 125 MCG tablet Take 1 tablet (125 mcg total) by mouth daily. 90 tablet 3   telmisartan-hydrochlorothiazide (MICARDIS HCT) 40-12.5 MG tablet Take 1 tablet by mouth daily. 90 tablet 3   traMADol (ULTRAM) 50 MG tablet TAKE ONE TABLET BY MOUTH EVERY NIGHT AT BEDTIME AS NEEDED FOR PAIN 90 tablet 1   No current facility-administered medications on file prior to visit.        ROS:  All others reviewed and negative.  Objective        PE:  BP 128/80 (BP Location: Left Arm, Patient Position: Sitting, Cuff Size: Normal)   Pulse 78   Temp 99 F (37.2 C) (Oral)   Ht 5' 5.5" (1.664 m)   Wt 180 lb (81.6 kg)   LMP 09/13/1994   SpO2 95%   BMI 29.50 kg/m                 Constitutional: Pt appears in NAD               HENT: Head: NCAT.                Right Ear: External ear normal.                 Left Ear: External ear normal.  Bilat tm's with mild erythema.  Max sinus areas non tender.  Pharynx with mild erythema, no exudate               Eyes: . Pupils are equal, round, and reactive to light. Conjunctivae and EOM are normal               Nose: without d/c or deformity               Neck: Neck supple. Gross normal ROM               Cardiovascular: Normal rate and regular rhythm.                 Pulmonary/Chest: Effort normal and breath sounds without rales or wheezing.                Abd:  Soft, NT, ND, + BS, no organomegaly               Neurological: Pt is alert. At baseline orientation, motor grossly intact               Skin: Skin is warm. LE edema - none, sore tender over left lateral greater trochanter; left patellar with mild degenerative changes               Psychiatric: Pt behavior is normal without agitation   Micro: none  Cardiac tracings I have personally interpreted today:  none  Pertinent Radiological findings (summarize): none   Lab Results  Component Value Date   WBC 8.3 09/08/2023   HGB 14.7 09/08/2023   HCT 44.2 09/08/2023   PLT 167.0 09/08/2023   GLUCOSE 100 (H) 09/08/2023   CHOL 142 07/12/2023   TRIG 122.0 07/12/2023   HDL 44.90 07/12/2023   LDLCALC 73 07/12/2023   ALT 14 09/08/2023   AST 17 09/08/2023   NA 138 09/08/2023   K 4.3 09/08/2023   CL 102 09/08/2023   CREATININE 0.74 09/08/2023   BUN 20 09/08/2023   CO2 29 09/08/2023   TSH 0.86 07/15/2023   HGBA1C 6.3 09/08/2023   MICROALBUR <0.7 10/21/2022  Assessment/Plan:  Mary Ward is a 77 y.o. White or Caucasian [1] female with  has a past medical history of Allergy, EN (erythema nodosum), Hyperlipidemia, Hyperplastic colonic polyp, Hypertension, Hypothyroidism, and Obesity.  Left groin pain Exam benign, can't r/o underlying djd left hip - for hip film  Intra-abdominal and pelvic swelling, mass and lump, unspecified site With hx of pancreatic lesion probable cyst, for f/u MRI abdomen  Left knee pain C/w djd, mild,  for refer sport med  LUQ pain Exam benign, for MRI abd as above  Bursitis of left hip Mild recurrent, also for refer sport medicine  Cough C/w likely viral illness, for cxr  Insomnia Mild to mod, for change ambien to restoril 15 - 30 at bedtime prn,,  to f/u any worsening symptoms or concerns  Essential hypertension BP Readings from Last 3 Encounters:  09/08/23 128/80  07/20/23 122/76  07/14/23 (!) 151/84   Stable, pt to continue medical treatment micardic hct 40 12.5 qd   Hyperglycemia Lab Results  Component Value Date   HGBA1C 6.3 09/08/2023   Stable, pt to continue current medical treatment  - diet, wt control  Followup: Return if symptoms worsen or fail to improve.  Oliver Barre, MD 09/10/2023 7:56 PM  [facility]

## 2023-09-08 NOTE — Progress Notes (Signed)
The test results show that your current treatment is OK, as the tests are stable.  Please continue the same plan.  There is no other need for change of treatment or further evaluation based on these results, at this time.  thanks 

## 2023-09-10 ENCOUNTER — Encounter: Payer: Self-pay | Admitting: Internal Medicine

## 2023-09-10 DIAGNOSIS — R1032 Left lower quadrant pain: Secondary | ICD-10-CM | POA: Insufficient documentation

## 2023-09-10 DIAGNOSIS — R1900 Intra-abdominal and pelvic swelling, mass and lump, unspecified site: Secondary | ICD-10-CM | POA: Insufficient documentation

## 2023-09-10 DIAGNOSIS — R1012 Left upper quadrant pain: Secondary | ICD-10-CM | POA: Insufficient documentation

## 2023-09-10 DIAGNOSIS — J069 Acute upper respiratory infection, unspecified: Secondary | ICD-10-CM | POA: Insufficient documentation

## 2023-09-10 NOTE — Assessment & Plan Note (Signed)
BP Readings from Last 3 Encounters:  09/08/23 128/80  07/20/23 122/76  07/14/23 (!) 151/84   Stable, pt to continue medical treatment micardic hct 40 12.5 qd

## 2023-09-10 NOTE — Assessment & Plan Note (Signed)
Lab Results  Component Value Date   HGBA1C 6.3 09/08/2023   Stable, pt to continue current medical treatment  - diet, wt control

## 2023-09-10 NOTE — Assessment & Plan Note (Signed)
C/w likely viral illness, for cxr

## 2023-09-10 NOTE — Assessment & Plan Note (Signed)
Exam benign, can't r/o underlying djd left hip - for hip film

## 2023-09-10 NOTE — Assessment & Plan Note (Signed)
Mild recurrent, also for refer sport medicine

## 2023-09-10 NOTE — Assessment & Plan Note (Signed)
With hx of pancreatic lesion probable cyst, for f/u MRI abdomen

## 2023-09-10 NOTE — Assessment & Plan Note (Signed)
Exam benign, for MRI abd as above

## 2023-09-10 NOTE — Assessment & Plan Note (Signed)
Mild to mod, for change ambien to restoril 15 - 30 at bedtime prn,,  to f/u any worsening symptoms or concerns

## 2023-09-10 NOTE — Assessment & Plan Note (Signed)
C/w djd, mild, for refer sport med

## 2023-09-12 NOTE — Progress Notes (Signed)
 I, Leotis Batter, CMA acting as a scribe for Artist Lloyd, MD.  Mary Ward is a 77 y.o. female who presents to [facility] at [facility] today for L knee pain x 3 months. Pt suffered a fall in Sept 2024. Pt locates pain to peripatellar. Swelling present. Minimal pain with ambulation but does has more pain laying in bed. Occasional weakness with ascending stairs  She has had evaluation and treatment for hip and low back pain thought to be bursitis and lumbar radiculopathy in the past.  She has had some physical therapy already at [facility] location and at [facility] orthopedics primarily focused it seems on her lumbar radiculopathy but not much on her hip or knee.  L Knee swelling: yes Mechanical symptoms: popping Aggravates: palpation, keeling Treatments tried: exercise, Voltaren Gel  Dx imaging: 06/20/23 L knee XR  Pertinent review of systems: No fevers or chills  Relevant historical information: Hypertension   Exam:  BP 138/84   Pulse 70   Ht 5' 5.5 (1.664 m)   LMP 09/13/1994   SpO2 97%   BMI 29.50 kg/m  General: Well Developed, well nourished, and in no acute distress.   MSK: Left hip normal-appearing tender palpation greater trochanter.  Decreased range of motion.  Left knee normal-appearing Mildly tender palpation anterior knee with no palpable swelling or palpable squeak.  Normal knee motion.  Stable ligamentous exam. Intact strength.    Radiology  Diagnostic Limited MSK Ultrasound of: Left knee Quad tendon intact normal-appearing no significant joint effusion. Patellar tendon normal-appearing with minimal hypoechoic fluid tracking superficial to the patellar tendon and suprapatellar space. Medial and lateral joint line generally normal-appearing. Posterior knee no Baker's cyst. Impression: Trace prepatellar bursitis   X-ray images bilateral hips obtained on September 08, 2023 but not yet read by radiology personally and independently  interpreted. Mild hip arthritis bilaterally. No acute fractures. Await formal radiology review  EXAM: LEFT KNEE - COMPLETE 4 VIEW   COMPARISON:  None Available.   FINDINGS: No evidence of fracture, dislocation, or joint effusion. Mild tricompartmental degenerative changes. Soft tissues are unremarkable.   IMPRESSION: 1. No acute fracture or dislocation. 2. Mild degenerative changes.     Electronically Signed   By: Rea Marc M.D.   On: 07/08/2023 09:16  Assessment and Plan: 77 y.o. female with  Chronic left knee pain occurring about 3 months ago after a fall.  Initially her pain was much more consistent with prepatellar bursitis.  Differential does include prepatellar bursitis but also patellofemoral pain or exacerbation of arthritis.  She is already had a good conservative management trial for this with Voltaren gel and compression and time.  Plan to add physical therapy for quad strengthening which I think will be quite essential.  Consider interarticular steroid injection if not better in the future.  Chronic left hip pain multifactorial.  Part of her pain is trochanteric bursitis type and part of her pain is arthritis type.  Plan for physical therapy focused on hip and knee.  Recheck in 2 months.   PDMP not reviewed this encounter. Orders Placed This Encounter  Procedures   US  LIMITED JOINT SPACE STRUCTURES LOW LEFT(NO LINKED CHARGES)    Reason for Exam (SYMPTOM  OR DIAGNOSIS REQUIRED):   left knee pain    Preferred imaging location?:   [facility] referral to Physical Therapy    Referral Priority:   Routine    Referral Type:   Physical Medicine  Referral Reason:   Specialty Services Required    Requested Specialty:   Physical Therapy    Number of Visits Requested:   1   No orders of the defined types were placed in this encounter.    Discussed warning signs or symptoms. Please see discharge instructions. Patient  expresses understanding.   The above documentation has been reviewed and is accurate and complete Artist Lloyd, M.D.

## 2023-09-13 ENCOUNTER — Ambulatory Visit: Payer: PPO | Admitting: Family Medicine

## 2023-09-13 ENCOUNTER — Other Ambulatory Visit: Payer: Self-pay

## 2023-09-13 ENCOUNTER — Encounter: Payer: Self-pay | Admitting: Family Medicine

## 2023-09-13 VITALS — BP 138/84 | HR 70 | Ht 65.5 in

## 2023-09-13 DIAGNOSIS — M25562 Pain in left knee: Secondary | ICD-10-CM

## 2023-09-13 DIAGNOSIS — M25552 Pain in left hip: Secondary | ICD-10-CM

## 2023-09-13 DIAGNOSIS — G8929 Other chronic pain: Secondary | ICD-10-CM

## 2023-09-13 NOTE — Patient Instructions (Addendum)
 Thank you for coming in today.   A referral has been placed for physical therapy at [facility] at [facility]

## 2023-09-17 ENCOUNTER — Other Ambulatory Visit: Payer: Self-pay | Admitting: Internal Medicine

## 2023-09-17 ENCOUNTER — Encounter: Payer: Self-pay | Admitting: Internal Medicine

## 2023-09-17 MED ORDER — LEVOFLOXACIN 500 MG PO TABS: 500.0000 mg | ORAL_TABLET | Freq: Every day | ORAL | 0 refills | Status: DC

## 2023-09-18 NOTE — Progress Notes (Signed)
 Cardiology Office Note:    Date:  09/22/2023   ID:  DAVETTA Ward, DOB 1946-02-11, MRN [id number]  PCP:  Norleen Lynwood ORN, MD  Cardiologist:  Lonni LITTIE Nanas, MD  Electrophysiologist:  None   Referring MD: Norleen Lynwood ORN, MD   Chief Complaint  Patient presents with   Pain    Patient stated that she has been having pain in her back left shoulder blade has not had pain in about a week    History of Present Illness:    Mary Ward is a 78 y.o. female with a hx of hypertension, hypothyroidism, hyperlipidemia, erythema nodosum who presents for follow-up.  She was referred by Dr. Norleen for evaluation of coronary calcifications, initially seen on 05/05/2021.  She denies any chest pain or dyspnea.  Reports she walks 2.5 miles 4-5 times a week and also does aerobic workouts.  No exertional symptoms.  She denies any lightheadedness, syncope, lower extremity edema.  Reports has very rare episodes of palpitations lasting few seconds but may be years between episodes.  Reports blood pressure has been well controlled.  She lost 55 pounds since last year with weight watchers.  No smoking history.  Family history includes mother had MI in 62s and sister died of MI at early 45s.  Calcium  score on 02/17/2021 was 258 (80th percentile).  Also with borderline aortic dilatation measuring 38 mm.  Underwent Lexiscan Myoview  in 11/2017 which showed normal perfusion, EF 68%.  Echocardiogram 05/08/2021 showed EF 55 to 60%, basal inferior hypokinesis, grade 1 diastolic dysfunction, mild RV dysfunction, mild MR, mild dilatation of ascending aorta measuring 39 mm.  Since last clinic visit, she reports she is doing okay.  States that her leg pain improved after she started on prednisone  for bursitis, was not related to statin use.  She is back on her statin and denies any myalgias.  Main issue has been having pain behind her left shoulder blade.  Worse with certain positions, not related to exertion.  Denies any chest pain,  dyspnea, lightheadedness, syncope, lower extremity edema, or palpitations.  She had a fall in September, tripped in the grocery store.  She goes to the gym 3 days a week to exercise, denies any exertional symptoms.   Past Medical History:  Diagnosis Date   Allergy    dust, dogs, grass   EN (erythema nodosum)    Hyperlipidemia    Hyperplastic colonic polyp    Hypertension    Hypothyroidism    Obesity     Past Surgical History:  Procedure Laterality Date   BREAST REDUCTION SURGERY  2002   BUNIONECTOMY     both feet   COLONOSCOPY  01/22/2014   CYST EXCISION  12/15/2012   punch biopsy left shoulder blade   HAMMER TOE SURGERY     right 2nd toe   LAPAROSCOPIC ASSISTED VAGINAL HYSTERECTOMY  1992   with BSO   OOPHORECTOMY     TONSILLECTOMY      Current Medications: Current Meds  Medication Sig   aspirin 81 MG tablet Take 81 mg by mouth daily.   atorvastatin  (LIPITOR) 40 MG tablet Take 1 tablet (40 mg total) by mouth daily.   cholecalciferol (VITAMIN D3) 25 MCG (1000 UNIT) tablet    Cyanocobalamin  (VITAMIN B-12) 1000 MCG/15ML LIQD    levofloxacin  (LEVAQUIN ) 500 MG tablet Take 1 tablet (500 mg total) by mouth daily.   meloxicam  (MOBIC ) 15 MG tablet TAKE 1 TABLET BY MOUTH DAILY AS NEEDED FOR PAIN  polyethylene glycol (MIRALAX / GLYCOLAX) 17 g packet Take 17 g by mouth daily.   SYNTHROID  125 MCG tablet Take 1 tablet (125 mcg total) by mouth daily.   telmisartan -hydrochlorothiazide (MICARDIS  HCT) 40-12.5 MG tablet Take 1 tablet by mouth daily.   temazepam  (RESTORIL ) 15 MG capsule 1-2 tab by mouth at  bedtime as needed   traMADol  (ULTRAM ) 50 MG tablet TAKE ONE TABLET BY MOUTH EVERY NIGHT AT BEDTIME AS NEEDED FOR PAIN     Allergies:   Indomethacin, Prevnar 13 [pneumococcal 13-val conj vacc], Tetanus toxoid, Tetracycline, and Trazodone  and nefazodone   Social History   Socioeconomic History   Marital status: Married    Spouse name: Toribio [employer]   Number of children: 0   Years  of education: Not on file   Highest education level: Some college, no degree  Occupational History   Occupation: Optometrist: [employer]  Tobacco Use   Smoking status: Never   Smokeless tobacco: Never  Vaping Use   Vaping status: Never Used  Substance and Sexual Activity   Alcohol use: Not Currently    Alcohol/week: 0.0 standard drinks of alcohol   Drug use: No   Sexual activity: Not Currently    Birth control/protection: Post-menopausal, Surgical    Comment: Hysterectomy  Other Topics Concern   Not on file  Social History Narrative   Family history of high Cholesterol   Social Drivers of Health   Financial Resource Strain: Low Risk  (09/04/2023)   Overall Financial Resource Strain (CARDIA)    Difficulty of Paying Living Expenses: Not hard at all  Food Insecurity: No Food Insecurity (09/04/2023)   Hunger Vital Sign    Worried About Running Out of Food in the Last Year: Never true    Ran Out of Food in the Last Year: Never true  Transportation Needs: No Transportation Needs (09/04/2023)   PRAPARE - Administrator, Civil Service (Medical): No    Lack of Transportation (Non-Medical): No  Physical Activity: Sufficiently Active (09/04/2023)   Exercise Vital Sign    Days of Exercise per Week: 3 days    Minutes of Exercise per Session: 60 min  Recent Concern: Physical Activity - Insufficiently Active (08/31/2023)   Exercise Vital Sign    Days of Exercise per Week: 3 days    Minutes of Exercise per Session: 40 min  Stress: Stress Concern Present (09/04/2023)   [employer] of Occupational Health - Occupational Stress Questionnaire    Feeling of Stress : To some extent  Social Connections: Socially Integrated (09/04/2023)   Social Connection and Isolation Panel [NHANES]    Frequency of Communication with Friends and Family: More than three times a week    Frequency of Social Gatherings with Friends and Family: More than three times a  week    Attends Religious Services: 1 to 4 times per year    Active Member of [employer] or Organizations: Yes    Attends Engineer, Structural: More than 4 times per year    Marital Status: Married     Family History: The patient's family history includes Cancer in her father; Diabetes in her sister; Heart attack in her mother; Heart disease in her sister; Hyperlipidemia in her mother; Hypertension in her mother and sister; Stroke in her mother. There is no history of Colon cancer, Esophageal cancer, Rectal cancer, Stomach cancer, or Colon polyps.  ROS:   Please see the history of present illness.  All other systems reviewed and are negative.  EKGs/Labs/Other Studies Reviewed:    The following studies were reviewed today:   EKG:   05/06/2022: Sinus bradycardia, rate 59, no ST abnormalities, poor R wave progression 05/23/23: Normal sinus rhythm, rate 64, Q waves in inferior leads, poor R wave progression 09/22/2023: Normal sinus rhythm, Q waves in leads III/aVF, poor R wave progression  Recent Labs: 05/23/2023: Magnesium 2.1 07/15/2023: TSH 0.86 09/08/2023: ALT 14; BUN 20; Creatinine, Ser 0.74; Hemoglobin 14.7; Platelets 167.0; Potassium 4.3; Sodium 138  Recent Lipid Panel    Component Value Date/Time   CHOL 142 07/12/2023 0756   CHOL 137 05/23/2023 1537   TRIG 122.0 07/12/2023 0756   TRIG 85 07/28/2006 0740   HDL 44.90 07/12/2023 0756   HDL 53 05/23/2023 1537   CHOLHDL 3 07/12/2023 0756   VLDL 24.4 07/12/2023 0756   LDLCALC 73 07/12/2023 0756   LDLCALC 65 05/23/2023 1537    Physical Exam:    VS:  BP (!) 148/79 (BP Location: Right Arm, Patient Position: Sitting, Cuff Size: Normal)   Pulse 68   Ht 5' 5 (1.651 m)   Wt 183 lb (83 kg)   LMP 09/13/1994   SpO2 95%   BMI 30.45 kg/m     Wt Readings from Last 3 Encounters:  09/22/23 183 lb (83 kg)  09/08/23 180 lb (81.6 kg)  08/31/23 178 lb 12.8 oz (81.1 kg)     GEN:  Well nourished, well developed in no acute  distress HEENT: Normal NECK: No JVD; No carotid bruits CARDIAC: RRR,2/6 systolic murmur RESPIRATORY:  Clear to auscultation without rales, wheezing or rhonchi  ABDOMEN: Soft, non-tender, non-distended MUSCULOSKELETAL:  No edema; No deformity  SKIN: Warm and dry NEUROLOGIC:  Alert and oriented x 3 PSYCHIATRIC:  Normal affect   ASSESSMENT:    1. Coronary artery disease involving native coronary artery of native heart without angina pectoris   2. Essential hypertension   3. Aortic dilatation (HCC)   4. Hyperlipidemia, unspecified hyperlipidemia type     PLAN:    CAD: Calcium  score on 02/17/2021 was 258 (80th percentile).  Denies any anginal symptoms. -Continue atorvastatin  40 mg daily -Continue aspirin 81 mg daily  Abnormal EKG: Poor R wave progression.  Echocardiogram 05/08/2021 showed EF 55 to 60%, basal inferior hypokinesis, grade 1 diastolic dysfunction, mild RV dysfunction, mild MR, mild dilatation of ascending aorta measuring 39 mm.  Aortic dilatation: Borderline ascending aortic dilatation noted on noncontrast chest CT for calcium  score.  CTA chest 04/27/2022 showed stable ascending aorta measured 38 mm.  Leg pain: Normal ABIs 05/2023.  Did trial off statin 05/2023 with no improvement in symptoms.  Diagnosed with bursitis, symptoms resolved with prednisone   Hypertension: On telmisartan -hydrochlorothiazide 40-12.5 mg daily.  BP elevated in clinic today.  Asked to check BP twice daily for next week and let us  know results  Hyperlipidemia: On atorvastatin  20 mg daily, LDL 83 on 02/01/23.  Atorvastatin  increased to 40 mg daily at that time.  LDL 73 on 07/12/2023  Pulmonary nodule: Noted on CTA chest 04/27/2022.  Stable on CT chest 04/2023, no further follow-up needed  RTC in 1 year   Medication Adjustments/Labs and Tests Ordered: Current medicines are reviewed at length with the patient today.  Concerns regarding medicines are outlined above.  Orders Placed This Encounter   Procedures   EKG 12-Lead   No orders of the defined types were placed in this encounter.    Patient Instructions  Medication Instructions:  Continue  current medications *If you need a refill on your cardiac medications before your next appointment, please call your pharmacy*   Lab Work: none If you have labs (blood work) drawn today and your tests are completely normal, you will receive your results only by: MyChart Message (if you have MyChart) OR A paper copy in the mail If you have any lab test that is abnormal or we need to change your treatment, we will call you to review the results.   Testing/Procedures: none   Follow-Up: At [facility], you and your health needs are our priority.  As part of our continuing mission to provide you with exceptional heart care, we have created designated Provider Care Teams.  These Care Teams include your primary Cardiologist (physician) and Advanced Practice Providers (APPs -  Physician Assistants and Nurse Practitioners) who all work together to provide you with the care you need, when you need it.  We recommend signing up for the patient portal called MyChart.  Sign up information is provided on this After Visit Summary.  MyChart is used to connect with patients for Virtual Visits (Telemedicine).  Patients are able to view lab/test results, encounter notes, upcoming appointments, etc.  Non-urgent messages can be sent to your provider as well.   To learn more about what you can do with MyChart, go to [email or web address].    Your next appointment:   1 year(s)  Provider:   Lonni LITTIE Nanas, MD     Other Instructions Please check blood pressure twice a day for next week and send via mychart        Signed, Lonni LITTIE Nanas, MD  09/22/2023 3:05 PM    [facility]

## 2023-09-19 ENCOUNTER — Encounter: Payer: Self-pay | Admitting: Internal Medicine

## 2023-09-19 ENCOUNTER — Ambulatory Visit
Admission: RE | Admit: 2023-09-19 | Discharge: 2023-09-19 | Disposition: A | Discharge status: Home / Self Care | Payer: PPO | Source: Ambulatory Visit | Attending: Internal Medicine | Admitting: Internal Medicine

## 2023-09-19 DIAGNOSIS — R1900 Intra-abdominal and pelvic swelling, mass and lump, unspecified site: Secondary | ICD-10-CM

## 2023-09-19 DIAGNOSIS — K862 Cyst of pancreas: Secondary | ICD-10-CM | POA: Diagnosis not present

## 2023-09-19 MED ORDER — GADOPICLENOL 0.5 MMOL/ML IV SOLN
8.0000 mL | Freq: Once | INTRAVENOUS | Status: AC | PRN
  Administered 2023-09-19: 8 mL via INTRAVENOUS

## 2023-09-22 ENCOUNTER — Encounter: Payer: Self-pay | Admitting: Cardiology

## 2023-09-22 ENCOUNTER — Ambulatory Visit: Payer: PPO | Attending: Cardiology | Admitting: Cardiology

## 2023-09-22 VITALS — BP 148/79 | HR 68 | Ht 65.0 in | Wt 183.0 lb

## 2023-09-22 DIAGNOSIS — I2510 Atherosclerotic heart disease of native coronary artery without angina pectoris: Secondary | ICD-10-CM

## 2023-09-22 DIAGNOSIS — I10 Essential (primary) hypertension: Secondary | ICD-10-CM | POA: Diagnosis not present

## 2023-09-22 DIAGNOSIS — I77819 Aortic ectasia, unspecified site: Secondary | ICD-10-CM

## 2023-09-22 DIAGNOSIS — E785 Hyperlipidemia, unspecified: Secondary | ICD-10-CM

## 2023-09-22 NOTE — Patient Instructions (Signed)
 Medication Instructions:  Continue current medications *If you need a refill on your cardiac medications before your next appointment, please call your pharmacy*   Lab Work: none If you have labs (blood work) drawn today and your tests are completely normal, you will receive your results only by: MyChart Message (if you have MyChart) OR A paper copy in the mail If you have any lab test that is abnormal or we need to change your treatment, we will call you to review the results.   Testing/Procedures: none   Follow-Up: At [facility], you and your health needs are our priority.  As part of our continuing mission to provide you with exceptional heart care, we have created designated Provider Care Teams.  These Care Teams include your primary Cardiologist (physician) and Advanced Practice Providers (APPs -  Physician Assistants and Nurse Practitioners) who all work together to provide you with the care you need, when you need it.  We recommend signing up for the patient portal called MyChart.  Sign up information is provided on this After Visit Summary.  MyChart is used to connect with patients for Virtual Visits (Telemedicine).  Patients are able to view lab/test results, encounter notes, upcoming appointments, etc.  Non-urgent messages can be sent to your provider as well.   To learn more about what you can do with MyChart, go to [email or web address].    Your next appointment:   1 year(s)  Provider:   Lonni LITTIE Nanas, MD     Other Instructions Please check blood pressure twice a day for next week and send via mychart

## 2023-09-27 DIAGNOSIS — G44201 Tension-type headache, unspecified, intractable: Secondary | ICD-10-CM | POA: Diagnosis not present

## 2023-09-27 DIAGNOSIS — M9901 Segmental and somatic dysfunction of cervical region: Secondary | ICD-10-CM | POA: Diagnosis not present

## 2023-09-27 DIAGNOSIS — M542 Cervicalgia: Secondary | ICD-10-CM | POA: Diagnosis not present

## 2023-09-27 DIAGNOSIS — M9900 Segmental and somatic dysfunction of head region: Secondary | ICD-10-CM | POA: Diagnosis not present

## 2023-09-28 DIAGNOSIS — M542 Cervicalgia: Secondary | ICD-10-CM | POA: Diagnosis not present

## 2023-09-28 DIAGNOSIS — M9900 Segmental and somatic dysfunction of head region: Secondary | ICD-10-CM | POA: Diagnosis not present

## 2023-09-28 DIAGNOSIS — M9901 Segmental and somatic dysfunction of cervical region: Secondary | ICD-10-CM | POA: Diagnosis not present

## 2023-09-28 DIAGNOSIS — G44201 Tension-type headache, unspecified, intractable: Secondary | ICD-10-CM | POA: Diagnosis not present

## 2023-09-30 ENCOUNTER — Encounter: Payer: Self-pay | Admitting: Cardiology

## 2023-10-13 ENCOUNTER — Ambulatory Visit: Payer: PPO | Admitting: Physical Therapy

## 2023-10-17 DIAGNOSIS — M542 Cervicalgia: Secondary | ICD-10-CM | POA: Diagnosis not present

## 2023-10-17 DIAGNOSIS — M9900 Segmental and somatic dysfunction of head region: Secondary | ICD-10-CM | POA: Diagnosis not present

## 2023-10-17 DIAGNOSIS — G44201 Tension-type headache, unspecified, intractable: Secondary | ICD-10-CM | POA: Diagnosis not present

## 2023-10-17 DIAGNOSIS — M9901 Segmental and somatic dysfunction of cervical region: Secondary | ICD-10-CM | POA: Diagnosis not present

## 2023-10-19 ENCOUNTER — Telehealth: Payer: Self-pay | Admitting: Physician Assistant

## 2023-10-19 ENCOUNTER — Telehealth: Payer: Self-pay | Admitting: Cardiology

## 2023-10-19 DIAGNOSIS — M9901 Segmental and somatic dysfunction of cervical region: Secondary | ICD-10-CM | POA: Diagnosis not present

## 2023-10-19 DIAGNOSIS — M542 Cervicalgia: Secondary | ICD-10-CM | POA: Diagnosis not present

## 2023-10-19 DIAGNOSIS — M9900 Segmental and somatic dysfunction of head region: Secondary | ICD-10-CM | POA: Diagnosis not present

## 2023-10-19 DIAGNOSIS — G44201 Tension-type headache, unspecified, intractable: Secondary | ICD-10-CM | POA: Diagnosis not present

## 2023-10-19 MED ORDER — OMEPRAZOLE 20 MG PO CPDR: 20.0000 mg | DELAYED_RELEASE_CAPSULE | Freq: Every day | ORAL | Status: DC

## 2023-10-19 NOTE — Telephone Encounter (Signed)
 Called and spoke to patient. Verified name and DOB. Patient report an increase in her BP last night and this morning. She stated that her cheeks looked flushed and she was having abdominal pain last night. She checked her BP which was 177/92. This morning she checked her BP again and it was 160/89. She deny any other symptoms at this time. I had patient recheck her BP while on the line it was 143/84. Patient stated she normally do not check her BP. Advised patient pain can sometime make your BP go up. Advised her to monitor her BP and keep a log. Patient told to call back if her BP remains elevated and/or she develop new symptoms. Patient verbalized new symptoms. Patient verbalized understanding and agree.

## 2023-10-19 NOTE — Telephone Encounter (Signed)
  Pt c/o BP issue: STAT if pt c/o blurred vision, one-sided weakness or slurred speech  1. What are your last 5 BP readings?  177/92 - last night 160/89 - this morning  2. Are you having any other symptoms (ex. Dizziness, headache, blurred vision, passed out)? none  3. What is your BP issue? The patient said that last night, she noticed her cheek looked flushed. She checked her blood pressure, which was 177/92, and this morning it was 160/89. She mentioned that she does not have any symptoms but is concerned about her high blood pressure.

## 2023-10-19 NOTE — Telephone Encounter (Signed)
 Called and spoke with patient. She reports that she had reflux years ago. She ate a raw stalk of celery yesterday and carrots and had burning and discomfort in her chest, under her breastbone. Pt had a severe fall months ago landing on her face and chest. PCP ordered an MRI which did not find any cause for her pain. Pt found some relief with Tums and Gas-x yesterday evening. She feels okay today. She takes Tylenol arthritis BID and it does not seem to help the discomfort. I reviewed an anti-reflux diet with patient. She mentioned that coffee and spicy foods do not seem to bother her yet. Pt denies any nausea or vomiting. Still able to tolerate most meals. Pt has been scheduled to see Dr. Federico at the end of April (former Dr. Aneita patient). Any recommendations in the interim?

## 2023-10-19 NOTE — Telephone Encounter (Signed)
 Called and informed patient that Delon would like for her to begin Omeprazole  20 mg daily, I informed pt that she should take on an empty stomach about 30-60 minutes before breakfast. Pt informed me that she takes her Synthroid  in the mornings, I have advised pt to reach out to pharmacist to see how far apart she should space out the Omeprazole  and Synthroid . Pt verbalized understanding and had no concerns at the end of the call.

## 2023-10-19 NOTE — Telephone Encounter (Signed)
Patient called stated she is experiencing a burning sensation in her chest whenever she eats certain foods. Please advise.

## 2023-10-20 NOTE — Telephone Encounter (Signed)
 Agree with plan, would check BP twice daily for next week and let us  know results

## 2023-10-24 DIAGNOSIS — M542 Cervicalgia: Secondary | ICD-10-CM | POA: Diagnosis not present

## 2023-10-24 DIAGNOSIS — G44201 Tension-type headache, unspecified, intractable: Secondary | ICD-10-CM | POA: Diagnosis not present

## 2023-10-24 DIAGNOSIS — M9900 Segmental and somatic dysfunction of head region: Secondary | ICD-10-CM | POA: Diagnosis not present

## 2023-10-24 DIAGNOSIS — M9901 Segmental and somatic dysfunction of cervical region: Secondary | ICD-10-CM | POA: Diagnosis not present

## 2023-10-24 NOTE — Telephone Encounter (Signed)
 Called and spoke to patient and per Dr. Alda Amas made patient aware to check blood pressure twice a day and send results via my chart. Patient verbalized and understanding.

## 2023-10-26 DIAGNOSIS — M542 Cervicalgia: Secondary | ICD-10-CM | POA: Diagnosis not present

## 2023-10-26 DIAGNOSIS — M9901 Segmental and somatic dysfunction of cervical region: Secondary | ICD-10-CM | POA: Diagnosis not present

## 2023-10-26 DIAGNOSIS — M9900 Segmental and somatic dysfunction of head region: Secondary | ICD-10-CM | POA: Diagnosis not present

## 2023-10-26 DIAGNOSIS — G44201 Tension-type headache, unspecified, intractable: Secondary | ICD-10-CM | POA: Diagnosis not present

## 2023-10-27 ENCOUNTER — Ambulatory Visit: Payer: PPO | Admitting: Physical Therapy

## 2023-10-31 DIAGNOSIS — M9901 Segmental and somatic dysfunction of cervical region: Secondary | ICD-10-CM | POA: Diagnosis not present

## 2023-10-31 DIAGNOSIS — G44201 Tension-type headache, unspecified, intractable: Secondary | ICD-10-CM | POA: Diagnosis not present

## 2023-10-31 DIAGNOSIS — M542 Cervicalgia: Secondary | ICD-10-CM | POA: Diagnosis not present

## 2023-10-31 DIAGNOSIS — M9900 Segmental and somatic dysfunction of head region: Secondary | ICD-10-CM | POA: Diagnosis not present

## 2023-11-03 ENCOUNTER — Encounter: Payer: Self-pay | Admitting: Cardiology

## 2023-11-04 ENCOUNTER — Telehealth: Payer: Self-pay | Admitting: *Deleted

## 2023-11-04 ENCOUNTER — Other Ambulatory Visit: Payer: Self-pay | Admitting: *Deleted

## 2023-11-04 MED ORDER — AMLODIPINE BESYLATE 5 MG PO TABS: 5.0000 mg | ORAL_TABLET | Freq: Every day | ORAL | 3 refills | Status: DC

## 2023-11-04 NOTE — Telephone Encounter (Signed)
Called and made patient aware that Dr.Schumann recommend starting amlodipine 5 mg daily and checking BP daily for next 2 weeks and send those results to my chart. Patient verbalized an understanding.

## 2023-11-04 NOTE — Telephone Encounter (Signed)
Would not suspect that omeprazole is increasing blood pressure.  Okay to restart.  I do think she will need something else for blood pressure.  Recommend starting amlodipine 5 mg daily and checking BP daily for next 2 weeks and let us know results

## 2023-11-07 DIAGNOSIS — M9901 Segmental and somatic dysfunction of cervical region: Secondary | ICD-10-CM | POA: Diagnosis not present

## 2023-11-07 DIAGNOSIS — M542 Cervicalgia: Secondary | ICD-10-CM | POA: Diagnosis not present

## 2023-11-07 DIAGNOSIS — G44201 Tension-type headache, unspecified, intractable: Secondary | ICD-10-CM | POA: Diagnosis not present

## 2023-11-07 DIAGNOSIS — M9900 Segmental and somatic dysfunction of head region: Secondary | ICD-10-CM | POA: Diagnosis not present

## 2023-11-08 DIAGNOSIS — M542 Cervicalgia: Secondary | ICD-10-CM | POA: Diagnosis not present

## 2023-11-08 DIAGNOSIS — M9900 Segmental and somatic dysfunction of head region: Secondary | ICD-10-CM | POA: Diagnosis not present

## 2023-11-08 DIAGNOSIS — M9901 Segmental and somatic dysfunction of cervical region: Secondary | ICD-10-CM | POA: Diagnosis not present

## 2023-11-08 DIAGNOSIS — G44201 Tension-type headache, unspecified, intractable: Secondary | ICD-10-CM | POA: Diagnosis not present

## 2023-11-13 ENCOUNTER — Encounter: Payer: Self-pay | Admitting: Internal Medicine

## 2023-11-14 ENCOUNTER — Ambulatory Visit: Payer: PPO | Admitting: Family Medicine

## 2023-11-19 ENCOUNTER — Encounter: Payer: Self-pay | Admitting: Cardiology

## 2023-11-22 ENCOUNTER — Ambulatory Visit: Payer: PPO | Admitting: Cardiology

## 2023-12-01 ENCOUNTER — Other Ambulatory Visit: Payer: Self-pay

## 2023-12-01 ENCOUNTER — Other Ambulatory Visit: Payer: Self-pay | Admitting: Internal Medicine

## 2023-12-26 DIAGNOSIS — K862 Cyst of pancreas: Secondary | ICD-10-CM | POA: Diagnosis not present

## 2023-12-26 DIAGNOSIS — N200 Calculus of kidney: Secondary | ICD-10-CM | POA: Diagnosis not present

## 2023-12-26 DIAGNOSIS — N281 Cyst of kidney, acquired: Secondary | ICD-10-CM | POA: Diagnosis not present

## 2024-01-09 ENCOUNTER — Other Ambulatory Visit (INDEPENDENT_AMBULATORY_CARE_PROVIDER_SITE_OTHER): Payer: PPO

## 2024-01-09 DIAGNOSIS — I10 Essential (primary) hypertension: Secondary | ICD-10-CM

## 2024-01-09 DIAGNOSIS — E559 Vitamin D deficiency, unspecified: Secondary | ICD-10-CM | POA: Diagnosis not present

## 2024-01-09 DIAGNOSIS — R739 Hyperglycemia, unspecified: Secondary | ICD-10-CM | POA: Diagnosis not present

## 2024-01-09 LAB — LIPID PANEL
Cholesterol: 141 mg/dL (ref 0–200)
HDL: 50.1 mg/dL (ref >39.00)
LDL Cholesterol: 76 mg/dL (ref 0–99)
NonHDL: 91.3
Total CHOL/HDL Ratio: 3
Triglycerides: 77 mg/dL (ref 0.0–149.0)
VLDL: 15.4 mg/dL (ref 0.0–40.0)

## 2024-01-09 LAB — URINALYSIS, ROUTINE W REFLEX MICROSCOPIC
Bilirubin Urine: NEGATIVE
Hgb urine dipstick: NEGATIVE
Ketones, ur: NEGATIVE
Leukocytes,Ua: NEGATIVE
Nitrite: NEGATIVE
RBC / HPF: NONE SEEN (ref 0–?)
Specific Gravity, Urine: 1.02 (ref 1.000–1.030)
Total Protein, Urine: NEGATIVE
Urine Glucose: NEGATIVE
Urobilinogen, UA: 0.2 (ref 0.0–1.0)
pH: 6 (ref 5.0–8.0)

## 2024-01-09 LAB — CBC WITH DIFFERENTIAL/PLATELET
Basophils Absolute: 0 10*3/uL (ref 0.0–0.1)
Basophils Relative: 0.4 % (ref 0.0–3.0)
Eosinophils Absolute: 0.3 10*3/uL (ref 0.0–0.7)
Eosinophils Relative: 2.8 % (ref 0.0–5.0)
HCT: 42.2 % (ref 36.0–46.0)
Hemoglobin: 13.7 g/dL (ref 12.0–15.0)
Lymphocytes Relative: 37.8 % (ref 12.0–46.0)
Lymphs Abs: 3.7 10*3/uL (ref 0.7–4.0)
MCHC: 32.4 g/dL (ref 30.0–36.0)
MCV: 84.5 fl (ref 78.0–100.0)
Monocytes Absolute: 0.6 10*3/uL (ref 0.1–1.0)
Monocytes Relative: 6.5 % (ref 3.0–12.0)
Neutro Abs: 5.1 10*3/uL (ref 1.4–7.7)
Neutrophils Relative %: 52.5 % (ref 43.0–77.0)
Platelets: 217 10*3/uL (ref 150.0–400.0)
RBC: 4.99 Mil/uL (ref 3.87–5.11)
RDW: 14.1 % (ref 11.5–15.5)
WBC: 9.7 10*3/uL (ref 4.0–10.5)

## 2024-01-09 LAB — HEPATIC FUNCTION PANEL
ALT: 19 U/L (ref 0–35)
AST: 15 U/L (ref 0–37)
Albumin: 4 g/dL (ref 3.5–5.2)
Alkaline Phosphatase: 96 U/L (ref 39–117)
Bilirubin, Direct: 0.1 mg/dL (ref 0.0–0.3)
Total Bilirubin: 0.5 mg/dL (ref 0.2–1.2)
Total Protein: 6.7 g/dL (ref 6.0–8.3)

## 2024-01-09 LAB — MICROALBUMIN / CREATININE URINE RATIO
Creatinine,U: 99.5 mg/dL
Microalb Creat Ratio: UNDETERMINED mg/g (ref 0.0–30.0)
Microalb, Ur: <0.7 mg/dL

## 2024-01-09 LAB — BASIC METABOLIC PANEL WITH GFR
BUN: 26 mg/dL — ABNORMAL HIGH (ref 6–23)
CO2: 31 meq/L (ref 19–32)
Calcium: 9.1 mg/dL (ref 8.4–10.5)
Chloride: 100 meq/L (ref 96–112)
Creatinine, Ser: 0.82 mg/dL (ref 0.40–1.20)
GFR: 69.47 mL/min (ref >60.00)
Glucose, Bld: 92 mg/dL (ref 70–99)
Potassium: 4.9 meq/L (ref 3.5–5.1)
Sodium: 139 meq/L (ref 135–145)

## 2024-01-09 LAB — VITAMIN D 25 HYDROXY (VIT D DEFICIENCY, FRACTURES): VITD: 48.81 ng/mL (ref 30.00–100.00)

## 2024-01-09 LAB — HEMOGLOBIN A1C: Hgb A1c MFr Bld: 6.3 % (ref 4.6–6.5)

## 2024-01-10 ENCOUNTER — Encounter: Payer: Self-pay | Admitting: Internal Medicine

## 2024-01-10 ENCOUNTER — Ambulatory Visit: Payer: PPO | Admitting: Internal Medicine

## 2024-01-10 DIAGNOSIS — E039 Hypothyroidism, unspecified: Secondary | ICD-10-CM

## 2024-01-16 ENCOUNTER — Other Ambulatory Visit

## 2024-01-16 DIAGNOSIS — E039 Hypothyroidism, unspecified: Secondary | ICD-10-CM | POA: Diagnosis not present

## 2024-01-17 ENCOUNTER — Encounter: Payer: Self-pay | Admitting: Internal Medicine

## 2024-01-17 ENCOUNTER — Telehealth: Payer: Self-pay

## 2024-01-17 ENCOUNTER — Ambulatory Visit (INDEPENDENT_AMBULATORY_CARE_PROVIDER_SITE_OTHER): Payer: PPO | Admitting: Internal Medicine

## 2024-01-17 VITALS — BP 126/70 | HR 70 | Temp 98.4°F | Ht 65.0 in | Wt 180.0 lb

## 2024-01-17 DIAGNOSIS — E785 Hyperlipidemia, unspecified: Secondary | ICD-10-CM | POA: Diagnosis not present

## 2024-01-17 DIAGNOSIS — I10 Essential (primary) hypertension: Secondary | ICD-10-CM

## 2024-01-17 DIAGNOSIS — E039 Hypothyroidism, unspecified: Secondary | ICD-10-CM | POA: Diagnosis not present

## 2024-01-17 DIAGNOSIS — Z0001 Encounter for general adult medical examination with abnormal findings: Secondary | ICD-10-CM | POA: Diagnosis not present

## 2024-01-17 DIAGNOSIS — R739 Hyperglycemia, unspecified: Secondary | ICD-10-CM | POA: Diagnosis not present

## 2024-01-17 LAB — THYROID PANEL WITH TSH
Free Thyroxine Index: 3.4 (ref 1.4–3.8)
T3 Uptake: 38 % — ABNORMAL HIGH (ref 22–35)
T4, Total: 9 ug/dL (ref 5.1–11.9)
TSH: 0.6 m[IU]/L (ref 0.40–4.50)

## 2024-01-17 MED ORDER — TRAMADOL HCL 50 MG PO TABS: ORAL_TABLET | ORAL | 1 refills | Status: AC

## 2024-01-17 NOTE — Progress Notes (Signed)
 Patient ID: MCKYNLIE DIPIETRANTONIO, female   DOB: May 12, 1946, 78 y.o.   MRN: [id number]         Chief Complaint:: wellness exam and left anterior thigh pain, low thyroid , htn, hld, hyperglycemia       HPI:  Mary Ward is a 78 y.o. female here for wellness exam; up to date                        Also to see GI soon, taking prillosec 20 mg daily, doing better with epigastric pains.  Still having difficiutly sleeping and tolerating meds for this but tylenol PM seem to help , has not yet tried melatonin.  Still has left anterior thigh pain, burning sensitive to left anterior thigh only.  Pt denies chest pain, increased sob or doe, wheezing, orthopnea, PND, increased LE swelling, palpitations, dizziness or syncope.   Pt denies polydipsia, polyuria, or new focal neuro s/s.    Pt denies fever, wt loss, night sweats, loss of appetite, or other constitutional symptoms   Wt Readings from Last 3 Encounters:  01/17/24 180 lb (81.6 kg)  09/22/23 183 lb (83 kg)  09/08/23 180 lb (81.6 kg)   BP Readings from Last 3 Encounters:  01/17/24 126/70  09/22/23 (!) 148/79  09/13/23 138/84   Immunization History  Administered Date(s) Administered   Fluad Quad(high Dose 65+) 06/23/2019, 07/02/2020, 06/26/2021, 06/21/2022   Fluad Trivalent(High Dose 65+) 06/20/2023   Influenza Whole 07/30/1999, 07/14/2006, 07/03/2007, 06/13/2008, 06/13/2009   Influenza, High Dose Seasonal PF 06/23/2018   Influenza, Seasonal, Injecte, Preservative Fre 06/13/2013   Influenza-Unspecified 06/20/2014, 06/19/2019, 06/18/2020   PFIZER(Purple Top)SARS-COV-2 Vaccination 10/18/2019, 11/12/2019, 06/09/2020   Pfizer Covid-19 Vaccine Bivalent Booster 46yrs & up 06/04/2021   Pneumococcal Conjugate-13 11/16/2013   Pneumococcal Polysaccharide-23 08/07/2007, 11/15/2012   Pneumococcal-Unspecified 03/02/2018   Td 09/13/1994   Unspecified SARS-COV-2 Vaccination 06/04/2021   Varicella 02/12/1951   Zoster Recombinant(Shingrix) 03/21/2019, 05/22/2019    Zoster, Live 01/01/2008, 05/29/2019  There are no preventive care reminders to display for this patient.    Past Medical History:  Diagnosis Date   Allergy    dust, dogs, grass   EN (erythema nodosum)    Hyperlipidemia    Hyperplastic colonic polyp    Hypertension    Hypothyroidism    Obesity    Past Surgical History:  Procedure Laterality Date   BREAST REDUCTION SURGERY  2002   BUNIONECTOMY     both feet   COLONOSCOPY  01/22/2014   CYST EXCISION  12/15/2012   punch biopsy left shoulder blade   HAMMER TOE SURGERY     right 2nd toe   LAPAROSCOPIC ASSISTED VAGINAL HYSTERECTOMY  1992   with BSO   OOPHORECTOMY     TONSILLECTOMY      reports that she has never smoked. She has never used smokeless tobacco. She reports that she does not currently use alcohol. She reports that she does not use drugs. family history includes Cancer in her father; Diabetes in her sister; Heart attack in her mother; Heart disease in her sister; Hyperlipidemia in her mother; Hypertension in her mother and sister; Stroke in her mother. Allergies  Allergen Reactions   Indomethacin     Took with ASA and caused severe bloating   Prevnar 13 [Pneumococcal 13-Val Conj Vacc]     Arm became very sore and pt developed "fiery red knot" at site   Tetanus Toxoid     Entire arm became swollen and  turned red   Tetracycline     "hot, red knots" below knees   Trazodone  And Nefazodone Other (See Comments)    dizziness   Zolpidem  Other (See Comments)   Current Outpatient Medications on File Prior to Visit  Medication Sig Dispense Refill   acetaminophen (TYLENOL) 500 MG tablet      amLODipine  (NORVASC ) 5 MG tablet Take 1 tablet (5 mg total) by mouth daily. 90 tablet 3   amoxicillin (AMOXIL) 500 MG capsule      aspirin 81 MG tablet Take 81 mg by mouth daily.     atorvastatin  (LIPITOR) 40 MG tablet Take 1 tablet (40 mg total) by mouth daily. 90 tablet 3   Cetylpyridinium Chloride (ANTISEPTIC ORAL RINSE MT)       cholecalciferol (VITAMIN D3) 25 MCG (1000 UNIT) tablet      Cyanocobalamin  (VITAMIN B-12) 1000 MCG/15ML LIQD      meloxicam  (MOBIC ) 15 MG tablet TAKE 1 TABLET BY MOUTH DAILY AS NEEDED FOR PAIN 90 tablet 1   methylPREDNISolone  (MEDROL  DOSEPAK) 4 MG TBPK tablet SMARTSIG:- Tablet(s) By Mouth -     methylPREDNISolone  (MEDROL ) 4 MG tablet      omeprazole  (PRILOSEC) 20 MG capsule Take 1 capsule (20 mg total) by mouth daily.     polyethylene glycol (MIRALAX / GLYCOLAX) 17 g packet Take 17 g by mouth daily.     SYNTHROID  125 MCG tablet Take 1 tablet (125 mcg total) by mouth daily. 90 tablet 3   telmisartan -hydrochlorothiazide (MICARDIS  HCT) 40-12.5 MG tablet Take 1 tablet by mouth daily. 90 tablet 3   No current facility-administered medications on file prior to visit.        ROS:  All others reviewed and negative.  Objective        PE:  BP 126/70 (BP Location: Right Arm, Patient Position: Sitting, Cuff Size: Normal)   Pulse 70   Temp 98.4 F (36.9 C) (Oral)   Ht 5\' 5"  (1.651 m)   Wt 180 lb (81.6 kg)   LMP 09/13/1994   SpO2 98%   BMI 29.95 kg/m                 Constitutional: Pt appears in NAD               HENT: Head: NCAT.                Right Ear: External ear normal.                 Left Ear: External ear normal.                Eyes: . Pupils are equal, round, and reactive to light. Conjunctivae and EOM are normal               Nose: without d/c or deformity               Neck: Neck supple. Gross normal ROM               Cardiovascular: Normal rate and regular rhythm.                 Pulmonary/Chest: Effort normal and breath sounds without rales or wheezing.                Abd:  Soft, NT, ND, + BS, no organomegaly               Neurological: Pt is alert. At baseline orientation, motor grossly  intact               Skin: Skin is warm. No rashes, no other new lesions, LE edema - none               Psychiatric: Pt behavior is normal without agitation   Micro: none  Cardiac tracings  I have personally interpreted today:  none  Pertinent Radiological findings (summarize): none   Lab Results  Component Value Date   WBC 9.7 01/09/2024   HGB 13.7 01/09/2024   HCT 42.2 01/09/2024   PLT 217.0 01/09/2024   GLUCOSE 92 01/09/2024   CHOL 141 01/09/2024   TRIG 77.0 01/09/2024   HDL 50.10 01/09/2024   LDLCALC 76 01/09/2024   ALT 19 01/09/2024   AST 15 01/09/2024   NA 139 01/09/2024   K 4.9 01/09/2024   CL 100 01/09/2024   CREATININE 0.82 01/09/2024   BUN 26 (H) 01/09/2024   CO2 31 01/09/2024   TSH 0.60 01/16/2024   HGBA1C 6.3 01/09/2024   MICROALBUR <0.7 01/09/2024   Assessment/Plan:  BRYANT MCNEELEY is a 78 y.o. White or Caucasian [1] female with  has a past medical history of Allergy, EN (erythema nodosum), Hyperlipidemia, Hyperplastic colonic polyp, Hypertension, Hypothyroidism, and Obesity.  Encounter for well adult exam with abnormal findings Age and sex appropriate education and counseling updated with regular exercise and diet Referrals for preventative services - none needed Immunizations addressed - none needed Smoking counseling  - none needed Evidence for depression or other mood disorder - none significant Most recent labs reviewed. I have personally reviewed and have noted: 1) the patient's medical and social history 2) The patient's current medications and supplements 3) The patient's height, weight, and BMI have been recorded in the chart   Hypothyroidism Lab Results  Component Value Date   TSH 0.60 01/16/2024   Stable, pt to continue levothyroxine  125 mcg qd   Hyperlipidemia Lab Results  Component Value Date   LDLCALC 76 01/09/2024   uncontrolled, pt to continue current statin lipitor 40 mg every day and lower chol diet, declines change for now   Essential hypertension BP Readings from Last 3 Encounters:  01/17/24 126/70  09/22/23 (!) 148/79  09/13/23 138/84   Stable, pt to continue medical treatment norvasc  5 every day, miardis  hct 40 12.5 qd   Hyperglycemia Lab Results  Component Value Date   HGBA1C 6.3 01/09/2024   Stable, pt to continue current medical treatment  - diet, wt control  Followup: Return in about 6 months (around 07/19/2024).  Rosalia Colonel, MD 01/17/2024 8:42 PM [facility]

## 2024-01-17 NOTE — Assessment & Plan Note (Signed)
 Lab Results  Component Value Date   HGBA1C 6.3 01/09/2024   Stable, pt to continue current medical treatment  - diet, wt control

## 2024-01-17 NOTE — Telephone Encounter (Signed)
 Ok to let pt know, her may 2023 DXA is considered normal, and we normally try to repeat at 2-3 yrs.  If she still wants this, let me know, thanks

## 2024-01-17 NOTE — Patient Instructions (Signed)
 Please continue all other medications as before, and refills have been done if requested.  Please have the pharmacy call with any other refills you may need.  Please continue your efforts at being more active, low cholesterol diet, and weight control.  You are otherwise up to date with prevention measures today.  Please keep your appointments with your specialists as you may have planned  Please let us  know if you would want to try the gabapentin for the left leg pain at bedtime  Your Lab work was excellent  Please make an Appointment to return in 6 months, or sooner if needed, also with Lab Appointment for testing done 3-5 days before at the FIRST FLOOR Lab (so this is for TWO appointments - please see the scheduling desk as you leave)

## 2024-01-17 NOTE — Assessment & Plan Note (Signed)

## 2024-01-17 NOTE — Assessment & Plan Note (Signed)
 BP Readings from Last 3 Encounters:  01/17/24 126/70  09/22/23 (!) 148/79  09/13/23 138/84   Stable, pt to continue medical treatment norvasc  5 every day, miardis hct 40 12.5 qd

## 2024-01-17 NOTE — Assessment & Plan Note (Signed)
 Lab Results  Component Value Date   LDLCALC 76 01/09/2024   uncontrolled, pt to continue current statin lipitor 40 mg every day and lower chol diet, declines change for now

## 2024-01-17 NOTE — Telephone Encounter (Signed)
 Oh no problem I always add the lab orders later in order to see the next patient/.    Ok to make lab appt anyway  See other note regarding bone density

## 2024-01-17 NOTE — Assessment & Plan Note (Signed)
 Lab Results  Component Value Date   TSH 0.60 01/16/2024   Stable, pt to continue levothyroxine  125 mcg qd

## 2024-01-18 NOTE — Telephone Encounter (Signed)
 Pt stated she will hold off until her next 6 month follow up for the bone density.

## 2024-02-01 ENCOUNTER — Encounter: Payer: Self-pay | Admitting: Internal Medicine

## 2024-02-01 ENCOUNTER — Ambulatory Visit: Payer: PPO | Admitting: Internal Medicine

## 2024-02-01 VITALS — BP 122/78 | HR 71 | Ht 65.0 in | Wt 180.0 lb

## 2024-02-01 DIAGNOSIS — K5900 Constipation, unspecified: Secondary | ICD-10-CM

## 2024-02-01 DIAGNOSIS — K219 Gastro-esophageal reflux disease without esophagitis: Secondary | ICD-10-CM

## 2024-02-01 DIAGNOSIS — R1010 Upper abdominal pain, unspecified: Secondary | ICD-10-CM

## 2024-02-01 NOTE — Patient Instructions (Signed)
 Continue OTC Prilosec.    Please follow up in one year  _______________________________________________________  If your blood pressure at your visit was 140/90 or greater, please contact your primary care physician to follow up on this.  _______________________________________________________  If you are age 78 or older, your body mass index should be between 23-30. Your Body mass index is 29.95 kg/m. If this is out of the aforementioned range listed, please consider follow up with your Primary Care Provider.  If you are age 34 or younger, your body mass index should be between 19-25. Your Body mass index is 29.95 kg/m. If this is out of the aformentioned range listed, please consider follow up with your Primary Care Provider.   ________________________________________________________  The [facility] providers would like to encourage you to use MYCHART to communicate with providers for non-urgent requests or questions.  Due to [facility] hold times on the telephone, sending your provider a message by [facility] may be a faster and more efficient way to get a response.  Please allow 48 business hours for a response.  Please remember that this is for non-urgent requests.  _______________________________________________________

## 2024-02-01 NOTE — Progress Notes (Signed)
 Chief Complaint: Abdominal pain, burning sensation  HPI:    Mary Ward is a 78 year old female with history of hypothyroidism, HTN, and seasonal allergies presents with ab pain    04/05/2019 colonoscopy with Dr. Sandrea Cruel for personal history of adenomatous polyps with 2 4 mm polyps in the rectum, mild diverticulosis in the left colon and internal hemorrhoids.  Pathology was benign.  Repeat recommended in 7 years.     Interval History: She had a mechanical fall in a grocery store in 05/2023 after she tripped on her shoes and landed on her face and chest. Luckily she did not break any bones but she did hurt her leg and developed some bilateral upper abdominal pain after that fall. This abdominal pain seemed to resolve over time. She was having also having really bad burning in her chest after eating certain foods. Once she started Prilosec in 10/2023, she has seen significant improvement in this burning sensation. She takes Miralax every morning to help with some underlying constipation. She uses meloxicam  every day some arthritis. Denies blood in the stools. Denies dysphagia. Denies N&V.   Wt Readings from Last 3 Encounters:  02/01/24 180 lb (81.6 kg)  01/17/24 180 lb (81.6 kg)  09/22/23 183 lb (83 kg)     Past Medical History:  Diagnosis Date   Allergy    dust, dogs, grass   EN (erythema nodosum)    Hyperlipidemia    Hyperplastic colonic polyp    Hypertension    Hypothyroidism    Obesity     Past Surgical History:  Procedure Laterality Date   BREAST REDUCTION SURGERY  2002   BUNIONECTOMY     both feet   COLONOSCOPY  01/22/2014   CYST EXCISION  12/15/2012   punch biopsy left shoulder blade   GUM SURGERY     HAMMER TOE SURGERY     right 2nd toe   LAPAROSCOPIC ASSISTED VAGINAL HYSTERECTOMY  1992   with BSO   OOPHORECTOMY     TONSILLECTOMY      Current Outpatient Medications  Medication Sig Dispense Refill   acetaminophen (TYLENOL) 500 MG tablet      amLODipine  (NORVASC ) 5 MG  tablet Take 1 tablet (5 mg total) by mouth daily. 90 tablet 3   aspirin 81 MG tablet Take 81 mg by mouth daily.     atorvastatin  (LIPITOR) 40 MG tablet Take 1 tablet (40 mg total) by mouth daily. 90 tablet 3   cholecalciferol (VITAMIN D3) 25 MCG (1000 UNIT) tablet      Cyanocobalamin  (VITAMIN B-12) 1000 MCG/15ML LIQD      meloxicam  (MOBIC ) 15 MG tablet TAKE 1 TABLET BY MOUTH DAILY AS NEEDED FOR PAIN 90 tablet 1   omeprazole  (PRILOSEC) 20 MG capsule Take 1 capsule (20 mg total) by mouth daily.     polyethylene glycol (MIRALAX / GLYCOLAX) 17 g packet Take 17 g by mouth daily.     SYNTHROID  125 MCG tablet Take 1 tablet (125 mcg total) by mouth daily. 90 tablet 3   telmisartan -hydrochlorothiazide (MICARDIS  HCT) 40-12.5 MG tablet Take 1 tablet by mouth daily. 90 tablet 3   traMADol  (ULTRAM ) 50 MG tablet TAKE ONE TABLET BY MOUTH EVERY NIGHT AT BEDTIME AS NEEDED FOR PAIN 90 tablet 1   No current facility-administered medications for this visit.    Allergies as of 02/01/2024 - Review Complete 02/01/2024  Allergen Reaction Noted   Indomethacin     Prevnar 13 [pneumococcal 13-val conj vacc]  01/08/2014   Tetanus  toxoid  08/07/2008   Tetracycline     Trazodone  and nefazodone Other (See Comments) 06/20/2023   Zolpidem  Other (See Comments) 07/20/2023    Family History  Problem Relation Age of Onset   Hyperlipidemia Mother    Stroke Mother    Hypertension Mother    Heart attack Mother    Cancer Father        Lung   Diabetes Sister    Heart disease Sister        CAD   Hypertension Sister    Colon cancer Neg Hx    Esophageal cancer Neg Hx    Rectal cancer Neg Hx    Stomach cancer Neg Hx    Colon polyps Neg Hx     Social History   Socioeconomic History   Marital status: Married    Spouse name: Yvonna Herder Osten   Number of children: 0   Years of education: Not on file   Highest education level: Some college, no degree  Occupational History   Occupation: Optometrist:  [employer]  Tobacco Use   Smoking status: Never   Smokeless tobacco: Never  Vaping Use   Vaping status: Never Used  Substance and Sexual Activity   Alcohol use: Not Currently    Alcohol/week: 0.0 standard drinks of alcohol   Drug use: No   Sexual activity: Not Currently    Birth control/protection: Post-menopausal, Surgical    Comment: Hysterectomy  Other Topics Concern   Not on file  Social History Narrative   Family history of high Cholesterol   Social Drivers of Health   Financial Resource Strain: Low Risk  (09/04/2023)   Overall Financial Resource Strain (CARDIA)    Difficulty of Paying Living Expenses: Not hard at all  Food Insecurity: No Food Insecurity (09/04/2023)   Hunger Vital Sign    Worried About Running Out of Food in the Last Year: Never true    Ran Out of Food in the Last Year: Never true  Transportation Needs: No Transportation Needs (09/04/2023)   PRAPARE - Administrator, Civil Service (Medical): No    Lack of Transportation (Non-Medical): No  Physical Activity: Sufficiently Active (09/04/2023)   Exercise Vital Sign    Days of Exercise per Week: 3 days    Minutes of Exercise per Session: 60 min  Recent Concern: Physical Activity - Insufficiently Active (08/31/2023)   Exercise Vital Sign    Days of Exercise per Week: 3 days    Minutes of Exercise per Session: 40 min  Stress: Stress Concern Present (09/04/2023)   [employer] of Occupational Health - Occupational Stress Questionnaire    Feeling of Stress : To some extent  Social Connections: Socially Integrated (09/04/2023)   Social Connection and Isolation Panel [NHANES]    Frequency of Communication with Friends and Family: More than three times a week    Frequency of Social Gatherings with Friends and Family: More than three times a week    Attends Religious Services: 1 to 4 times per year    Active Member of [employer] or Organizations: Yes    Attends Banker  Meetings: More than 4 times per year    Marital Status: Married  Catering manager Violence: Not At Risk (08/31/2023)   Humiliation, Afraid, Rape, and Kick questionnaire    Fear of Current or Ex-Partner: No    Emotionally Abused: No    Physically Abused: No    Sexually Abused: No  Physical Exam:  Vital signs: BP 122/78   Pulse 71   Ht 5\' 5"  (1.651 m)   Wt 180 lb (81.6 kg)   LMP 09/13/1994   BMI 29.95 kg/m   Constitutional:   Pleasant Caucasian female appears to be in NAD, Well developed, Well nourished, alert and cooperative Respiratory: Respirations even and unlabored. Lungs clear to auscultation bilaterally.   No wheezes, crackles, or rhonchi.  Cardiovascular: Normal S1, S2. No MRG. Regular rate and rhythm. No peripheral edema, cyanosis or pallor.  Gastrointestinal:  Soft, nondistended, non-tender. No appreciable masses or hepatomegaly. Rectal:  Not performed.  Psychiatric: Demonstrates good judgement and reason without abnormal affect or behaviors.  RELEVANT LABS AND IMAGING: CBC    Component Value Date/Time   WBC 9.7 01/09/2024 0820   RBC 4.99 01/09/2024 0820   HGB 13.7 01/09/2024 0820   HCT 42.2 01/09/2024 0820   PLT 217.0 01/09/2024 0820   MCV 84.5 01/09/2024 0820   MCH 28.1 04/07/2020 0911   MCHC 32.4 01/09/2024 0820   RDW 14.1 01/09/2024 0820   LYMPHSABS 3.7 01/09/2024 0820   MONOABS 0.6 01/09/2024 0820   EOSABS 0.3 01/09/2024 0820   BASOSABS 0.0 01/09/2024 0820    CMP     Component Value Date/Time   NA 139 01/09/2024 0820   NA 140 05/23/2023 1537   K 4.9 01/09/2024 0820   CL 100 01/09/2024 0820   CO2 31 01/09/2024 0820   GLUCOSE 92 01/09/2024 0820   GLUCOSE 113 (H) 07/28/2006 0740   BUN 26 (H) 01/09/2024 0820   BUN 18 05/23/2023 1537   CREATININE 0.82 01/09/2024 0820   CALCIUM  9.1 01/09/2024 0820   PROT 6.7 01/09/2024 0820   ALBUMIN 4.0 01/09/2024 0820   AST 15 01/09/2024 0820   ALT 19 01/09/2024 0820   ALKPHOS 96 01/09/2024 0820   BILITOT 0.5  01/09/2024 0820   GFRNONAA >60 04/07/2020 0911   GFRAA >60 04/07/2020 0911    Assessment: GERD Upper abdominal pain Constipation Patient presents with a burning sensation that has improved on Prilosec therapy.  She also experience some upper abdominal pain after a fall in 05/2023, which may have led to some musculoskeletal injury.  Patient does state that she takes meloxicam  every day so this could increase her risk for having PUD and/or gastritis.  Prilosec may have helped with this as well as with any uncontrolled reflux. Her constipation seems to be well controlled on Miralax QD  Plan: - Cont Prilosec 20 mg every day for now - Continue Miralax QD - Next colonoscopy is due in 03/2026 for polyp surveillance - RTC 1 year  Regino Caprio, MD [facility] 02/01/2024, 8:54 AM   I spent 33 minutes of time, including in depth chart review, independent review of results as outlined above, communicating results with the patient directly, face-to-face time with the patient, coordinating care, ordering studies and medications as appropriate, and documentation.

## 2024-03-06 DIAGNOSIS — Z6830 Body mass index (BMI) 30.0-30.9, adult: Secondary | ICD-10-CM | POA: Diagnosis not present

## 2024-03-06 DIAGNOSIS — Z01419 Encounter for gynecological examination (general) (routine) without abnormal findings: Secondary | ICD-10-CM | POA: Diagnosis not present

## 2024-03-06 DIAGNOSIS — Z1231 Encounter for screening mammogram for malignant neoplasm of breast: Secondary | ICD-10-CM | POA: Diagnosis not present

## 2024-03-31 ENCOUNTER — Other Ambulatory Visit: Payer: Self-pay | Admitting: Internal Medicine

## 2024-04-18 DIAGNOSIS — L814 Other melanin hyperpigmentation: Secondary | ICD-10-CM | POA: Diagnosis not present

## 2024-04-18 DIAGNOSIS — D225 Melanocytic nevi of trunk: Secondary | ICD-10-CM | POA: Diagnosis not present

## 2024-04-18 DIAGNOSIS — L821 Other seborrheic keratosis: Secondary | ICD-10-CM | POA: Diagnosis not present

## 2024-04-18 DIAGNOSIS — L578 Other skin changes due to chronic exposure to nonionizing radiation: Secondary | ICD-10-CM | POA: Diagnosis not present

## 2024-04-18 DIAGNOSIS — L603 Nail dystrophy: Secondary | ICD-10-CM | POA: Diagnosis not present

## 2024-04-18 DIAGNOSIS — L570 Actinic keratosis: Secondary | ICD-10-CM | POA: Diagnosis not present

## 2024-04-23 ENCOUNTER — Ambulatory Visit: Admitting: Podiatry

## 2024-04-23 DIAGNOSIS — B351 Tinea unguium: Secondary | ICD-10-CM | POA: Diagnosis not present

## 2024-04-23 DIAGNOSIS — E119 Type 2 diabetes mellitus without complications: Secondary | ICD-10-CM | POA: Insufficient documentation

## 2024-04-23 NOTE — Progress Notes (Signed)
 Chief Complaint  Patient presents with   Nail Problem    Right 1st hallux has some discoloration. Has been present for quite some time. Started as a smaller area and has grown in size. Only used ciclopirox solution once. Per patient, she is not diabetic, however chart shows a diagnosis of DM type 2. Takes ASA 81 mg.     HPI: 78 y.o. female presents today with concern of possible fungal involvement to the right great toenail.  States that she has some white discoloration that has been spreading more proximal toward the cuticle along the lateral nail margin.  She was prescribed ciclopirox nail lacquer and applied it once, but states that she does not want to continue having to apply this for up to a year and have it possibly not work.  She also is not interested in oral medication due to the risks and possible side effects.  She is interested in pursuing the laser nail treatment.  Past Medical History:  Diagnosis Date   Allergy    dust, dogs, grass   EN (erythema nodosum)    Hyperlipidemia    Hyperplastic colonic polyp    Hypertension    Hypothyroidism    Obesity    Past Surgical History:  Procedure Laterality Date   BREAST REDUCTION SURGERY  2002   BUNIONECTOMY     both feet   COLONOSCOPY  01/22/2014   CYST EXCISION  12/15/2012   punch biopsy left shoulder blade   GUM SURGERY     HAMMER TOE SURGERY     right 2nd toe   LAPAROSCOPIC ASSISTED VAGINAL HYSTERECTOMY  1992   with BSO   OOPHORECTOMY     TONSILLECTOMY     Allergies  Allergen Reactions   Indomethacin     Took with ASA and caused severe bloating   Prevnar 13 [Pneumococcal 13-Val Conj Vacc]     Arm became very sore and pt developed fiery red knot at site   Tetanus Toxoid     Entire arm became swollen and turned red   Tetracycline     hot, red knots below knees   Trazodone  And Nefazodone Other (See Comments)    dizziness   Zolpidem  Other (See Comments)     Physical Exam: Palpable pedal pulses noted.  No  ecchymosis or erythema to the periungual region of the right hallux nail.  The right hallux nail along the distal lateral portion has some subungual debris present, white discoloration, minimal distal onycholysis and pain with compression.  Epicritic sensation is intact  Assessment/Plan of Care: 1. Dermatophytosis of nail    It seems that the patient already has a clear grasp of the benefits and risks of the topical versus oral versus laser treatment.  I did review the laser treatment at our office in more detail today.  Patient would like to pursue this.  Will get her scheduled with our nurse for 6 laser nail treatments and we will follow-up with her posttreatment.  She was informed that since she has the ciclopirox lacquer she can continue to use this, as our nurse would remove any lacquer buildup prior to treatment with the laser on the day of appointment.   Awanda CHARM Imperial, DPM, FACFAS [facility]     [address]  [place], [place] [address]                Office [phone]  Fax [phone]

## 2024-05-09 ENCOUNTER — Other Ambulatory Visit: Payer: Self-pay | Admitting: Internal Medicine

## 2024-05-18 ENCOUNTER — Other Ambulatory Visit: Payer: Self-pay

## 2024-05-18 ENCOUNTER — Ambulatory Visit (INDEPENDENT_AMBULATORY_CARE_PROVIDER_SITE_OTHER): Admitting: Internal Medicine

## 2024-05-18 ENCOUNTER — Encounter: Payer: Self-pay | Admitting: Internal Medicine

## 2024-05-18 VITALS — BP 130/72 | HR 80 | Temp 98.2°F | Ht 64.17 in | Wt 180.8 lb

## 2024-05-18 DIAGNOSIS — L299 Pruritus, unspecified: Secondary | ICD-10-CM | POA: Diagnosis not present

## 2024-05-18 DIAGNOSIS — Z888 Allergy status to other drugs, medicaments and biological substances status: Secondary | ICD-10-CM | POA: Diagnosis not present

## 2024-05-18 DIAGNOSIS — G479 Sleep disorder, unspecified: Secondary | ICD-10-CM | POA: Diagnosis not present

## 2024-05-18 DIAGNOSIS — J3089 Other allergic rhinitis: Secondary | ICD-10-CM

## 2024-05-18 DIAGNOSIS — T50905A Adverse effect of unspecified drugs, medicaments and biological substances, initial encounter: Secondary | ICD-10-CM

## 2024-05-18 NOTE — Progress Notes (Signed)
 NEW PATIENT Date of Service/Encounter:  05/22/24 Referring provider: Norleen Lynwood ORN, MD Primary care provider: Norleen Lynwood ORN, MD  Subjective:  Mary Ward is a 78 y.o. female  presenting today for evaluation of itch, rash  History obtained from: chart review and patient.   Discussed the use of AI scribe software for clinical note transcription with the patient, who gave verbal consent to proceed.  History of Present Illness Mary Ward is a 78 year old female who presents with itching and sleep disturbances.  Previously patient took Benadryl every night for sleep.  Due to concerns for the risk of dementia she tried to transition to Zyrtec.  However Zyrtec caused significant somnolence and she was apprehensive about further use.  She stopped her Zyrtec and developed significant intense itching for the past 2 to 3 weeks.  - Initial involvement of feet and hands, later spreading to ankles, arms, neck, head, eyelids, and perianal area - No associated rash - Severe pruritus causing significant discomfort and she made allergy appointment. -Given severity she tried having the doses Zyrtec as needed with rapid relief of itching within 15 minutes - Intervals between Zyrtec doses are increasing and she feels like itch is improving. - Past use of Ambien  caused constipation - Past use of temazepam  caused dizziness - Has not seen sleep medicine  Allergic rhinitis symptoms - History of positive allergy testing to trees, grass, dust, and dogs approximately 35 years ago - Experiences rhinorrhea when exposed to grass and bamboo sheets - Allergic symptoms do not significantly impact daily activities - Not interested in updating testing  Past Medical History: Past Medical History:  Diagnosis Date   Allergy    dust, dogs, grass   EN (erythema nodosum)    Hyperlipidemia    Hyperplastic colonic polyp    Hypertension    Hypothyroidism    Obesity    Medication List:  Current Outpatient  Medications  Medication Sig Dispense Refill   acetaminophen (TYLENOL) 500 MG tablet      amLODipine  (NORVASC ) 5 MG tablet Take 1 tablet (5 mg total) by mouth daily. 90 tablet 3   aspirin 81 MG tablet Take 81 mg by mouth daily.     atorvastatin  (LIPITOR) 40 MG tablet TAKE 1 TABLET BY MOUTH DAILY 90 tablet 3   cetirizine HCl (ZYRTEC) 5 MG/5ML SOLN      cholecalciferol (VITAMIN D3) 25 MCG (1000 UNIT) tablet      Cyanocobalamin  (VITAMIN B-12) 1000 MCG/15ML LIQD      meloxicam  (MOBIC ) 15 MG tablet TAKE 1 TABLET BY MOUTH DAILY AS NEEDED FOR PAIN 90 tablet 1   polyethylene glycol (MIRALAX / GLYCOLAX) 17 g packet Take 17 g by mouth daily.     SYNTHROID  125 MCG tablet TAKE 1 TABLET (125 MCG TOTAL) BY MOUTH DAILY. 90 tablet 3   telmisartan -hydrochlorothiazide (MICARDIS  HCT) 40-12.5 MG tablet TAKE 1 TABLET BY MOUTH DAILY 90 tablet 3   traMADol  (ULTRAM ) 50 MG tablet TAKE ONE TABLET BY MOUTH EVERY NIGHT AT BEDTIME AS NEEDED FOR PAIN 90 tablet 1   No current facility-administered medications for this visit.   Known Allergies:  Allergies  Allergen Reactions   Indomethacin     Took with ASA and caused severe bloating   Prevnar 13 [Pneumococcal 13-Val Conj Vacc]     Arm became very sore and pt developed fiery red knot at site   Tetanus Toxoid     Entire arm became swollen and turned red  Tetracycline     hot, red knots below knees   Trazodone  And Nefazodone Other (See Comments)    dizziness   Zolpidem  Other (See Comments)   Past Surgical History: Past Surgical History:  Procedure Laterality Date   ADENOIDECTOMY     BREAST REDUCTION SURGERY  2002   BUNIONECTOMY     both feet   COLONOSCOPY  01/22/2014   CYST EXCISION  12/15/2012   punch biopsy left shoulder blade   GUM SURGERY     HAMMER TOE SURGERY     right 2nd toe   LAPAROSCOPIC ASSISTED VAGINAL HYSTERECTOMY  1992   with BSO   OOPHORECTOMY     TONSILLECTOMY     Family History: Family History  Problem Relation Age of Onset    Hyperlipidemia Mother    Stroke Mother    Hypertension Mother    Heart attack Mother    Cancer Father        Lung   Diabetes Sister    Heart disease Sister        CAD   Hypertension Sister    Colon cancer Neg Hx    Esophageal cancer Neg Hx    Rectal cancer Neg Hx    Stomach cancer Neg Hx    Colon polyps Neg Hx    Social History: Mary Ward lives single-family home is 78 years old.  Hardwood throughout.  Gas heating central cooling.  No pets.  No roaches in the house.  Currently retired.  No tobacco exposure..   ROS:  All other systems negative except as noted per HPI.  Objective:  Blood pressure 130/72, pulse 80, temperature 98.2 F (36.8 C), height 5' 4.17 (1.63 m), weight 180 lb 12.8 oz (82 kg), last menstrual period 09/13/1994, SpO2 96%. Body mass index is 30.87 kg/m. Physical Exam:  General Appearance:  Alert, cooperative, no distress, appears stated age  Head:  Normocephalic, without obvious abnormality, atraumatic  Eyes:  Conjunctiva clear, EOM's intact  Ears EACs normal bilaterally  Nose: Nares normal, normal mucosa, no visible anterior polyps, and septum midline  Throat: Lips, tongue normal; teeth and gums normal, normal posterior oropharynx  Neck: Supple, symmetrical  Lungs:   clear to auscultation bilaterally, Respirations unlabored, no coughing  Heart:  regular rate and rhythm and no murmur, Appears well perfused  Extremities: No edema  Skin: Skin color, texture, turgor normal and no rashes or lesions on visualized portions of skin  Neurologic: No gross deficits   Diagnostics: None done    Labs:  Lab Orders  No laboratory test(s) ordered today     Assessment and Plan  Assessment and Plan Assessment & Plan Rebound pruritus after antihistamine withdrawal Rebound pruritus due to Zyrtec withdrawal, confirmed by symptom pattern and online research. Symptoms improving with longer intervals between episodes. No rash present. Diabetic itch ruled out. Expected  resolution within two weeks if antihistamines are avoided. - Advise avoiding regular Zyrtec use to prevent rebound pruritus. - Consider Allegra (fexofenadine)  for breakthrough itching, less likely to cause rebound. - Recommend Sarna lotion for symptomatic relief. - Encourage Eucerin for itchy skin as needed.  Allergic rhinitis Allergic rhinitis with runny nose triggered by grass and bamboo exposure. Symptoms controlled with antihistamines, but she prefers to avoid them due to side effects. No current need for nasal Atrovent. - Consider nasal Atrovent if symptoms worsen.  Insomnia Chronic insomnia worsened by antihistamine use. Zyrtec at 10 mg overly sedating, 5 mg ineffective. History of undesirable side effects from Ambien  and  temazepam . Antihistamines not ideal for sleep due to REM sleep impact and dementia risk. Magnesium glycinate or threonate suggested as alternatives. CBD discussed. Referral to sleep specialist considered if issues persist. - Recommend magnesium glycinate or threonate as non-antihistamine sleep aid. - Advise against antihistamines like Zyrtec, Benadryl, or Xyzal for sleep due to dementia risk. - Consider referral to sleep medicine specialist if issues persist.  Follow up: as needed       This note in its entirety was forwarded to the Provider who requested this consultation.  Other:   Thank you for your kind referral. I appreciate the opportunity to take part in Mary Ward's care. Please do not hesitate to contact me with questions.   Sincerely,  Thank you so much for letting me partake in your care today.  Don't hesitate to reach out if you have any additional concerns!  Hargis Springer, MD  [facility]- [place], [place]

## 2024-05-18 NOTE — Patient Instructions (Signed)
 Rebound pruritus after antihistamine withdrawal Rebound pruritus due to Zyrtec withdrawal, confirmed by symptom pattern and online research. Symptoms improving with longer intervals between episodes. No rash present. Diabetic itch ruled out. Expected resolution within two weeks if antihistamines are avoided. - Advise avoiding regular Zyrtec use to prevent rebound pruritus. - Consider Allegra (fexofenadine)  for breakthrough itching, less likely to cause rebound. - Recommend Sarna lotion for symptomatic relief. - Encourage Eucerin for itchy skin as needed.  Allergic rhinitis Allergic rhinitis with runny nose triggered by grass and bamboo exposure. Symptoms controlled with antihistamines, but she prefers to avoid them due to side effects. No current need for nasal Atrovent. - Consider nasal Atrovent if symptoms worsen.  Insomnia Chronic insomnia worsened by antihistamine use. Zyrtec at 10 mg overly sedating, 5 mg ineffective. History of undesirable side effects from Ambien  and temazepam . Antihistamines not ideal for sleep due to REM sleep impact and dementia risk. Magnesium glycinate or threonate suggested as alternatives. CBD discussed. Referral to sleep specialist considered if issues persist. - Recommend magnesium glycinate or threonate as non-antihistamine sleep aid. - Advise against antihistamines like Zyrtec, Benadryl, or Xyzal for sleep due to dementia risk. - Consider referral to sleep medicine specialist if issues persist.  Follow up: as needed

## 2024-05-23 ENCOUNTER — Encounter: Payer: Self-pay | Admitting: Internal Medicine

## 2024-05-24 ENCOUNTER — Encounter: Payer: Self-pay | Admitting: Cardiology

## 2024-05-25 NOTE — Telephone Encounter (Signed)
 Can stop amlodipine  and see if itching resolves.  With stopping amlodipine , would check BP twice daily for next week and let us  know results

## 2024-05-29 ENCOUNTER — Ambulatory Visit (INDEPENDENT_AMBULATORY_CARE_PROVIDER_SITE_OTHER): Payer: Self-pay

## 2024-05-29 DIAGNOSIS — B351 Tinea unguium: Secondary | ICD-10-CM

## 2024-05-29 NOTE — Progress Notes (Signed)
 Patient presents today for the 1st laser treatment. Diagnosed with mycotic nail infection by Dr. Loel.   Toenail most affected right 1st halux nail.Patient does notice some whitening along the middle of the left 1st.  All other systems are negative.  Nails were filed thin. Laser therapy was administered to bilateral 1st toenails and patient tolerated the treatment well. All safety precautions were in place.   Post treatment instructions reviewed and provided to patient. Patient had no questions regarding plan of care.   Follow up in 4 weeks for laser # 2.

## 2024-05-29 NOTE — Patient Instructions (Signed)

## 2024-06-11 ENCOUNTER — Other Ambulatory Visit: Payer: Self-pay | Admitting: Internal Medicine

## 2024-06-11 ENCOUNTER — Telehealth: Payer: Self-pay | Admitting: Cardiology

## 2024-06-11 NOTE — Telephone Encounter (Signed)
 Spoke with Pt. Pt states she is at another appointment currently but was planing to send MyChart message with BP readings for review.

## 2024-06-11 NOTE — Telephone Encounter (Signed)
 Pt c/o BP issue: STAT if pt c/o blurred vision, one-sided weakness or slurred speech.  STAT if BP is GREATER than 180/120 TODAY.  STAT if BP is LESS than 90/60 and SYMPTOMATIC TODAY  1. What is your BP concern? Hypertension   2. Have you taken any BP medication today? No   3. What are your last 5 BP readings? 134/73, Has been higher  4. Are you having any other symptoms (ex. Dizziness, headache, blurred vision, passed out)? No

## 2024-06-19 ENCOUNTER — Ambulatory Visit

## 2024-06-26 ENCOUNTER — Ambulatory Visit: Payer: Self-pay

## 2024-06-26 ENCOUNTER — Ambulatory Visit (INDEPENDENT_AMBULATORY_CARE_PROVIDER_SITE_OTHER): Payer: Self-pay

## 2024-06-26 DIAGNOSIS — B351 Tinea unguium: Secondary | ICD-10-CM

## 2024-06-26 NOTE — Progress Notes (Signed)
 Patient presents today for the 2nd laser treatment. Diagnosed with mycotic nail infection by Dr. Loel.   Toenail most affected right 1st hallux.  All other systems are negative.  Nails were filed thin. Laser therapy was administered to 1st toenails bilaterally and patient tolerated the treatment well. All safety precautions were in place.   Post treatment instructions reviewed and provided to patient. Patient had no questions regarding plan of care.   Follow up in 4 weeks for laser # 3.

## 2024-07-03 ENCOUNTER — Telehealth: Payer: Self-pay

## 2024-07-03 ENCOUNTER — Encounter: Payer: Self-pay | Admitting: Internal Medicine

## 2024-07-03 DIAGNOSIS — E039 Hypothyroidism, unspecified: Secondary | ICD-10-CM

## 2024-07-03 NOTE — Telephone Encounter (Signed)
 Ok labs are ordered

## 2024-07-03 NOTE — Telephone Encounter (Signed)
 Copied from CRM #[id number]. Topic: Clinical - Request for Lab/Test Order >> Jul 03, 2024  8:58 AM Turkey A wrote: Reason for CRM: Patient wants order for Labs to be ordered and she wants her Thyroid  to be tested as well-please send orders before appt on 07/19/24

## 2024-07-03 NOTE — Addendum Note (Signed)
 Addended by: NORLEEN LYNWOOD ORN on: 07/03/2024 04:54 PM   Modules accepted: Orders

## 2024-07-06 ENCOUNTER — Encounter: Payer: Self-pay | Admitting: Internal Medicine

## 2024-07-09 ENCOUNTER — Encounter: Payer: Self-pay | Admitting: Internal Medicine

## 2024-07-09 ENCOUNTER — Other Ambulatory Visit (INDEPENDENT_AMBULATORY_CARE_PROVIDER_SITE_OTHER)

## 2024-07-09 DIAGNOSIS — I10 Essential (primary) hypertension: Secondary | ICD-10-CM

## 2024-07-09 DIAGNOSIS — E785 Hyperlipidemia, unspecified: Secondary | ICD-10-CM | POA: Diagnosis not present

## 2024-07-09 DIAGNOSIS — R739 Hyperglycemia, unspecified: Secondary | ICD-10-CM

## 2024-07-09 DIAGNOSIS — E039 Hypothyroidism, unspecified: Secondary | ICD-10-CM | POA: Diagnosis not present

## 2024-07-09 LAB — HEPATIC FUNCTION PANEL
ALT: 17 U/L (ref 0–35)
AST: 18 U/L (ref 0–37)
Albumin: 4 g/dL (ref 3.5–5.2)
Alkaline Phosphatase: 100 U/L (ref 39–117)
Bilirubin, Direct: 0.2 mg/dL (ref 0.0–0.3)
Total Bilirubin: 0.5 mg/dL (ref 0.2–1.2)
Total Protein: 6.8 g/dL (ref 6.0–8.3)

## 2024-07-09 LAB — LIPID PANEL
Cholesterol: 125 mg/dL (ref 0–200)
HDL: 42 mg/dL (ref >39.00)
LDL Cholesterol: 67 mg/dL (ref 0–99)
NonHDL: 83.47
Total CHOL/HDL Ratio: 3
Triglycerides: 84 mg/dL (ref 0.0–149.0)
VLDL: 16.8 mg/dL (ref 0.0–40.0)

## 2024-07-09 LAB — BASIC METABOLIC PANEL WITH GFR
BUN: 23 mg/dL (ref 6–23)
CO2: 27 meq/L (ref 19–32)
Calcium: 9.3 mg/dL (ref 8.4–10.5)
Chloride: 104 meq/L (ref 96–112)
Creatinine, Ser: 0.75 mg/dL (ref 0.40–1.20)
GFR: 77.05 mL/min (ref >60.00)
Glucose, Bld: 103 mg/dL — ABNORMAL HIGH (ref 70–99)
Potassium: 4 meq/L (ref 3.5–5.1)
Sodium: 139 meq/L (ref 135–145)

## 2024-07-09 LAB — VITAMIN D 25 HYDROXY (VIT D DEFICIENCY, FRACTURES): VITD: 51.03 ng/mL (ref 30.00–100.00)

## 2024-07-09 LAB — HEMOGLOBIN A1C: Hgb A1c MFr Bld: 6.1 % (ref 4.6–6.5)

## 2024-07-10 ENCOUNTER — Telehealth: Payer: Self-pay

## 2024-07-10 DIAGNOSIS — I10 Essential (primary) hypertension: Secondary | ICD-10-CM

## 2024-07-10 LAB — THYROID PANEL WITH TSH
Free Thyroxine Index: 3.5 (ref 1.4–3.8)
T3 Uptake: 34 % (ref 22–35)
T4, Total: 10.4 ug/dL (ref 5.1–11.9)
TSH: 0.36 m[IU]/L — ABNORMAL LOW (ref 0.40–4.50)

## 2024-07-10 NOTE — Telephone Encounter (Signed)
 Ok to let pt know -   I didn't think the cbc test was absolutely needed this time as it has been essentially normal for the last 3 yrs I can tell on the chart,  but I did order this to be done if she is wanting to do this     thanks

## 2024-07-10 NOTE — Telephone Encounter (Signed)
 Copied from CRM [id number]. Topic: Clinical - Lab/Test Results >> Jul 10, 2024  8:56 AM Mary Ward wrote: Reason for CRM: Patient called in requesting her results for her CBC with Diff test. Patient can be reached at [phone]. I did not see a result and she wants to make sure it was collected.

## 2024-07-11 NOTE — Telephone Encounter (Signed)
 Copied from CRM [id number]. Topic: Clinical - Lab/Test Results >> Jul 11, 2024  8:58 AM Frederich PARAS wrote: Reason for CRM: pt called in to check status of blood work, she want to know if they are processing the cbc differential. Please reach out to pt.

## 2024-07-12 NOTE — Telephone Encounter (Signed)
 Called and let Pt know lab order has been placed.

## 2024-07-13 ENCOUNTER — Other Ambulatory Visit

## 2024-07-13 DIAGNOSIS — I10 Essential (primary) hypertension: Secondary | ICD-10-CM

## 2024-07-13 LAB — URINALYSIS, ROUTINE W REFLEX MICROSCOPIC
Bilirubin Urine: NEGATIVE
Hgb urine dipstick: NEGATIVE
Ketones, ur: NEGATIVE
Nitrite: NEGATIVE
Specific Gravity, Urine: 1.02 (ref 1.000–1.030)
Total Protein, Urine: NEGATIVE
Urine Glucose: NEGATIVE
Urobilinogen, UA: 0.2 (ref 0.0–1.0)
pH: 5.5 (ref 5.0–8.0)

## 2024-07-13 LAB — CBC WITH DIFFERENTIAL/PLATELET
Basophils Absolute: 0 K/uL (ref 0.0–0.1)
Basophils Relative: 0.5 % (ref 0.0–3.0)
Eosinophils Absolute: 0.2 K/uL (ref 0.0–0.7)
Eosinophils Relative: 2.9 % (ref 0.0–5.0)
HCT: 42.2 % (ref 36.0–46.0)
Hemoglobin: 13.8 g/dL (ref 12.0–15.0)
Lymphocytes Relative: 29.6 % (ref 12.0–46.0)
Lymphs Abs: 2.2 K/uL (ref 0.7–4.0)
MCHC: 32.7 g/dL (ref 30.0–36.0)
MCV: 83.8 fl (ref 78.0–100.0)
Monocytes Absolute: 0.5 K/uL (ref 0.1–1.0)
Monocytes Relative: 7.3 % (ref 3.0–12.0)
Neutro Abs: 4.4 K/uL (ref 1.4–7.7)
Neutrophils Relative %: 59.7 % (ref 43.0–77.0)
Platelets: 135 K/uL — ABNORMAL LOW (ref 150.0–400.0)
RBC: 5.04 Mil/uL (ref 3.87–5.11)
RDW: 13.8 % (ref 11.5–15.5)
WBC: 7.3 K/uL (ref 4.0–10.5)

## 2024-07-16 ENCOUNTER — Telehealth: Payer: Self-pay

## 2024-07-16 ENCOUNTER — Other Ambulatory Visit: Payer: Self-pay | Admitting: Internal Medicine

## 2024-07-16 DIAGNOSIS — R8290 Unspecified abnormal findings in urine: Secondary | ICD-10-CM

## 2024-07-16 NOTE — Telephone Encounter (Signed)
 Copied from CRM #[id number]. Topic: Clinical - Medical Advice >> Jul 13, 2024  3:47 PM Mary Ward wrote: Reason for CRM: Patient called in regarding labs, stated that she ready that she was positive for a UTI , would like for Dr. Norleen to review them today and send her ina prescription to the pharmacy before the weekend,, would like for someone to send her a message through mychart

## 2024-07-16 NOTE — Telephone Encounter (Signed)
 On review, the urine test shows very small indication of elevated WBC only.   This can be non specific, so if she is having symptoms we should order a urine culture as well, as sometimes the symptoms can be difficult to interpret.   I will do this.  If she has no symptoms such as dysuria, frequency, urgency, flank pain, hematuria or n/v, fever, chills, then we would not likely need to treat this urine result, and she would not need the urine culture.   thanks

## 2024-07-19 ENCOUNTER — Encounter: Payer: Self-pay | Admitting: Internal Medicine

## 2024-07-19 ENCOUNTER — Ambulatory Visit: Admitting: Internal Medicine

## 2024-07-19 VITALS — BP 126/78 | HR 60 | Temp 98.7°F | Ht 64.0 in | Wt 179.0 lb

## 2024-07-19 DIAGNOSIS — M7651 Patellar tendinitis, right knee: Secondary | ICD-10-CM | POA: Diagnosis not present

## 2024-07-19 DIAGNOSIS — E119 Type 2 diabetes mellitus without complications: Secondary | ICD-10-CM | POA: Diagnosis not present

## 2024-07-19 DIAGNOSIS — E785 Hyperlipidemia, unspecified: Secondary | ICD-10-CM

## 2024-07-19 DIAGNOSIS — E039 Hypothyroidism, unspecified: Secondary | ICD-10-CM

## 2024-07-19 DIAGNOSIS — I10 Essential (primary) hypertension: Secondary | ICD-10-CM | POA: Diagnosis not present

## 2024-07-19 DIAGNOSIS — E559 Vitamin D deficiency, unspecified: Secondary | ICD-10-CM | POA: Diagnosis not present

## 2024-07-19 DIAGNOSIS — E2839 Other primary ovarian failure: Secondary | ICD-10-CM

## 2024-07-19 DIAGNOSIS — R739 Hyperglycemia, unspecified: Secondary | ICD-10-CM

## 2024-07-19 DIAGNOSIS — R8290 Unspecified abnormal findings in urine: Secondary | ICD-10-CM | POA: Diagnosis not present

## 2024-07-19 NOTE — Patient Instructions (Addendum)
 Please continue all other medications as before, and refills have been done if requested.  Please have the pharmacy call with any other refills you may need.  Please continue your efforts at being more active, low cholesterol diet, and weight control.  You are otherwise up to date with prevention measures today.  Please keep your appointments with your specialists as you may have planned - orthopedic for the knees  You will be contacted regarding the referral for: Bone Density  Please go to the LAB at the blood drawing area for the tests to be done - just the urine testing today  Please return in 2 months for repeat thyroid  panel testing  Please make an Appointment to return in 6 months, or sooner if needed

## 2024-07-19 NOTE — Progress Notes (Signed)
 Patient ID: Mary Ward, female   DOB: 01-28-1946, 78 y.o.   MRN: [id number]        Chief Complaint: follow up HTN, HLD , DM , abnormal ua, low tsh low thyroid , estrogen deficiency, right patellar tendonitis, low vit d       HPI:  Mary Ward is a 78 y.o. female here overall doing ok.  Denies urinary symptoms such as dysuria, frequency, urgency, flank pain, hematuria or n/v, fever, chills, but pt very concerned about elevated WBC 3-6 with UA lab prior to visit.  Denies hyper or hypo thyroid  symptoms such as voice, skin or hair change.  Due for DXA with hx of estrogen deficiency.  Pt denies chest pain, increased sob or doe, wheezing, orthopnea, PND, increased LE swelling, palpitations, dizziness or syncope.   Pt denies polydipsia, polyuria, or new focal neuro s/s.    Pt denies fever, wt loss, night sweats, loss of appetite, or other constitutional symptoms   Does have have mild intermittent right patellar tendon soreness to walking, but no falls.  Also has more painful left knee DJD, and plans f/u with ortho soon.       Wt Readings from Last 3 Encounters:  07/19/24 179 lb (81.2 kg)  05/18/24 180 lb 12.8 oz (82 kg)  02/01/24 180 lb (81.6 kg)   BP Readings from Last 3 Encounters:  07/19/24 126/78  05/18/24 130/72  02/01/24 122/78         Past Medical History:  Diagnosis Date   Allergy    dust, dogs, grass   EN (erythema nodosum)    Hyperlipidemia    Hyperplastic colonic polyp    Hypertension    Hypothyroidism    Obesity    Past Surgical History:  Procedure Laterality Date   ADENOIDECTOMY     BREAST REDUCTION SURGERY  2002   BUNIONECTOMY     both feet   COLONOSCOPY  01/22/2014   CYST EXCISION  12/15/2012   punch biopsy left shoulder blade   GUM SURGERY     HAMMER TOE SURGERY     right 2nd toe   LAPAROSCOPIC ASSISTED VAGINAL HYSTERECTOMY  1992   with BSO   OOPHORECTOMY     TONSILLECTOMY      reports that she has never smoked. She has never used smokeless tobacco. She reports  that she does not currently use alcohol. She reports that she does not use drugs. family history includes Cancer in her father; Diabetes in her sister; Heart attack in her mother; Heart disease in her sister; Hyperlipidemia in her mother; Hypertension in her mother and sister; Stroke in her mother. Allergies  Allergen Reactions   Amlodipine  Itching   Indomethacin     Took with ASA and caused severe bloating   Prevnar 13 [Pneumococcal 13-Val Conj Vacc]     Arm became very sore and pt developed fiery red knot at site   Tetanus Toxoid     Entire arm became swollen and turned red   Tetracycline     hot, red knots below knees   Trazodone  And Nefazodone Other (See Comments)    dizziness   Zolpidem  Other (See Comments)   Current Outpatient Medications on File Prior to Visit  Medication Sig Dispense Refill   acetaminophen (TYLENOL) 500 MG tablet      aspirin 81 MG tablet Take 81 mg by mouth daily.     atorvastatin  (LIPITOR) 40 MG tablet TAKE 1 TABLET BY MOUTH DAILY 90 tablet 3  cholecalciferol (VITAMIN D3) 25 MCG (1000 UNIT) tablet      Cyanocobalamin  (VITAMIN B-12) 1000 MCG/15ML LIQD      meloxicam  (MOBIC ) 15 MG tablet TAKE 1 TABLET BY MOUTH DAILY AS NEEDED FOR PAIN 90 tablet 1   polyethylene glycol (MIRALAX / GLYCOLAX) 17 g packet Take 17 g by mouth daily.     SYNTHROID  125 MCG tablet TAKE 1 TABLET (125 MCG TOTAL) BY MOUTH DAILY. 90 tablet 3   telmisartan -hydrochlorothiazide (MICARDIS  HCT) 40-12.5 MG tablet TAKE 1 TABLET BY MOUTH DAILY 90 tablet 3   traMADol  (ULTRAM ) 50 MG tablet TAKE ONE TABLET BY MOUTH EVERY NIGHT AT BEDTIME AS NEEDED FOR PAIN 90 tablet 1   No current facility-administered medications on file prior to visit.        ROS:  All others reviewed and negative.  Objective        PE:  BP 126/78 (BP Location: Right Arm, Patient Position: Sitting, Cuff Size: Normal)   Pulse 60   Temp 98.7 F (37.1 C) (Oral)   Ht 5' 4 (1.626 m)   Wt 179 lb (81.2 kg)   LMP 09/13/1994    SpO2 99%   BMI 30.73 kg/m                 Constitutional: Pt appears in NAD               HENT: Head: NCAT.                Right Ear: External ear normal.                 Left Ear: External ear normal.                Eyes: . Pupils are equal, round, and reactive to light. Conjunctivae and EOM are normal               Nose: without d/c or deformity               Neck: Neck supple. Gross normal ROM               Cardiovascular: Normal rate and regular rhythm.                 Pulmonary/Chest: Effort normal and breath sounds without rales or wheezing.                Abd:  Soft, NT, ND, + BS, no organomegaly               Neurological: Pt is alert. At baseline orientation, motor grossly intact               Skin: Skin is warm. No rashes, no other new lesions, LE edema - none; mild tender right patellar tendon insertion site without swelling or skin change               Psychiatric: Pt behavior is normal without agitation   Micro: none  Cardiac tracings I have personally interpreted today:  none  Pertinent Radiological findings (summarize): none   Lab Results  Component Value Date   WBC 7.3 07/13/2024   HGB 13.8 07/13/2024   HCT 42.2 07/13/2024   PLT 135.0 (L) 07/13/2024   GLUCOSE 103 (H) 07/09/2024   CHOL 125 07/09/2024   TRIG 84.0 07/09/2024   HDL 42.00 07/09/2024   LDLCALC 67 07/09/2024   ALT 17 07/09/2024   AST 18 07/09/2024   NA 139 07/09/2024  K 4.0 07/09/2024   CL 104 07/09/2024   CREATININE 0.75 07/09/2024   BUN 23 07/09/2024   CO2 27 07/09/2024   TSH 0.36 (L) 07/09/2024   HGBA1C 6.1 07/09/2024   MICROALBUR <0.7 01/09/2024   Assessment/Plan:  Mary Ward is a 78 y.o. White or Caucasian [1] female with  has a past medical history of Allergy, EN (erythema nodosum), Hyperlipidemia, Hyperplastic colonic polyp, Hypertension, Hypothyroidism, and Obesity.  Vitamin D  deficiency Last vitamin D  Lab Results  Component Value Date   VD25OH 51.03 07/09/2024   Stable,  cont oral replacement   Hypothyroidism Lab Results  Component Value Date   TSH 0.36 (L) 07/09/2024   Mild low TSH, pt asymptomatic, cont same dose for now and repeat thyroid  panel in 2 mo  Hyperlipidemia Lab Results  Component Value Date   LDLCALC 67 07/09/2024   Stable, pt to continue current statin lipitor 40 mg qd   Essential hypertension BP Readings from Last 3 Encounters:  07/19/24 126/78  05/18/24 130/72  02/01/24 122/78   Stable, pt to continue medical treatment micardis  hct 40 12.5 qd   Diabetes mellitus type II, controlled (HCC) Controlled  Lab Results  Component Value Date   HGBA1C 6.1 07/09/2024   Stable, pt to continue current medical treatment  - diet, wt control   Abnormal urinalysis Ok for urine cx per pt request  Estrogen deficiency Also for Dxa as is due  Patellar tendinitis Mild intermittent, for tylenol prn  Followup: Return in about 6 months (around 01/16/2025).  Lynwood Rush, MD 07/22/2024 2:27 PM [facility]

## 2024-07-19 NOTE — Telephone Encounter (Signed)
 Pt seen in office today.

## 2024-07-20 LAB — URINE CULTURE

## 2024-07-21 ENCOUNTER — Ambulatory Visit: Payer: Self-pay | Admitting: Internal Medicine

## 2024-07-22 ENCOUNTER — Encounter: Payer: Self-pay | Admitting: Internal Medicine

## 2024-07-22 DIAGNOSIS — E2839 Other primary ovarian failure: Secondary | ICD-10-CM | POA: Insufficient documentation

## 2024-07-22 DIAGNOSIS — M7650 Patellar tendinitis, unspecified knee: Secondary | ICD-10-CM | POA: Insufficient documentation

## 2024-07-22 DIAGNOSIS — R8290 Unspecified abnormal findings in urine: Secondary | ICD-10-CM | POA: Insufficient documentation

## 2024-07-22 NOTE — Assessment & Plan Note (Signed)
 Controlled  Lab Results  Component Value Date   HGBA1C 6.1 07/09/2024   Stable, pt to continue current medical treatment  - diet, wt control

## 2024-07-22 NOTE — Assessment & Plan Note (Signed)
 Ok for urine cx per pt request

## 2024-07-22 NOTE — Assessment & Plan Note (Signed)
 Lab Results  Component Value Date   TSH 0.36 (L) 07/09/2024   Mild low TSH, pt asymptomatic, cont same dose for now and repeat thyroid  panel in 2 mo

## 2024-07-22 NOTE — Assessment & Plan Note (Signed)
Also for Dxa as is due

## 2024-07-22 NOTE — Assessment & Plan Note (Signed)
 Mild intermittent, for tylenol prn

## 2024-07-22 NOTE — Assessment & Plan Note (Signed)
 BP Readings from Last 3 Encounters:  07/19/24 126/78  05/18/24 130/72  02/01/24 122/78   Stable, pt to continue medical treatment micardis  hct 40 12.5 qd

## 2024-07-22 NOTE — Assessment & Plan Note (Signed)
 Last vitamin D  Lab Results  Component Value Date   VD25OH 51.03 07/09/2024   Stable, cont oral replacement

## 2024-07-22 NOTE — Assessment & Plan Note (Signed)
 Lab Results  Component Value Date   LDLCALC 67 07/09/2024   Stable, pt to continue current statin lipitor 40 mg qd

## 2024-07-23 ENCOUNTER — Ambulatory Visit (INDEPENDENT_AMBULATORY_CARE_PROVIDER_SITE_OTHER)
Admission: RE | Admit: 2024-07-23 | Discharge: 2024-07-23 | Disposition: A | Discharge status: Home / Self Care | Source: Ambulatory Visit | Attending: Internal Medicine | Admitting: Internal Medicine

## 2024-07-23 DIAGNOSIS — E2839 Other primary ovarian failure: Secondary | ICD-10-CM | POA: Diagnosis not present

## 2024-07-24 ENCOUNTER — Ambulatory Visit (INDEPENDENT_AMBULATORY_CARE_PROVIDER_SITE_OTHER): Payer: Self-pay

## 2024-07-24 ENCOUNTER — Ambulatory Visit: Payer: Self-pay | Admitting: Internal Medicine

## 2024-07-24 ENCOUNTER — Ambulatory Visit: Payer: Self-pay

## 2024-07-24 DIAGNOSIS — B351 Tinea unguium: Secondary | ICD-10-CM

## 2024-07-24 NOTE — Progress Notes (Signed)
 Patient presents today for the 3rd laser treatment. Diagnosed with mycotic nail infection by Dr. Loel.   Toenail most affected right 1st hallux.  All other systems are negative.  Nails were filed thin. Laser therapy was administered to 1st  toenails bilaterally and patient tolerated the treatment well. All safety precautions were in place.   Post treatment instructions reviewed and provided to patient. Patient had no questions regarding plan of care.   Follow up in 6 weeks for laser # 4.

## 2024-08-16 DIAGNOSIS — M25562 Pain in left knee: Secondary | ICD-10-CM | POA: Diagnosis not present

## 2024-08-16 DIAGNOSIS — G8929 Other chronic pain: Secondary | ICD-10-CM | POA: Diagnosis not present

## 2024-08-16 DIAGNOSIS — M25561 Pain in right knee: Secondary | ICD-10-CM | POA: Diagnosis not present

## 2024-08-30 ENCOUNTER — Telehealth: Payer: Self-pay | Admitting: Internal Medicine

## 2024-08-30 DIAGNOSIS — E039 Hypothyroidism, unspecified: Secondary | ICD-10-CM

## 2024-08-30 NOTE — Telephone Encounter (Signed)
 Copied from CRM #[id number]. Topic: Appointments - Scheduling Inquiry for Clinic >> Aug 30, 2024  8:23 AM Mary Ward wrote: Reason for CRM: Patient would like a thyroid  check please contact the patient for scheduling she would like to get this done tomorrow 12/19 During wellness. Contact patient [phone]

## 2024-08-30 NOTE — Telephone Encounter (Unsigned)
 Copied from CRM #[id number]. Topic: Clinical - Request for Lab/Test Order >> Aug 30, 2024  3:29 PM Carlyon D wrote: Reason for CRM: Pt is calling that she needs her thyroid  lab order put in pt states this order was suppose to be entered back in November and still has not not heard anything in regards to her thyroid  lab order and pt is wanting to get these labs done tomorrow morning when she comes for her AWV. Pt also called at 830 am this morning and was told she would get a call back before 3 pm today and has not received no call. please call pt back as she is upset

## 2024-08-30 NOTE — Telephone Encounter (Signed)
 Ok thyroid  lab is ordered

## 2024-08-30 NOTE — Addendum Note (Signed)
 Addended by: NORLEEN LYNWOOD ORN on: 08/30/2024 04:44 PM   Modules accepted: Orders

## 2024-08-31 ENCOUNTER — Ambulatory Visit: Payer: PPO

## 2024-08-31 ENCOUNTER — Other Ambulatory Visit

## 2024-08-31 VITALS — BP 140/80 | HR 60 | Ht 64.5 in | Wt 185.8 lb

## 2024-08-31 DIAGNOSIS — Z0000 Encounter for general adult medical examination without abnormal findings: Secondary | ICD-10-CM

## 2024-08-31 DIAGNOSIS — E039 Hypothyroidism, unspecified: Secondary | ICD-10-CM

## 2024-08-31 NOTE — Patient Instructions (Addendum)
 [name],  Thank you for taking the time for your Medicare Wellness Visit. I appreciate your continued commitment to your health goals. Please review the care plan we discussed, and feel free to reach out if I can assist you further.  Please note that Annual Wellness Visits do not include a physical exam. Some assessments may be limited, especially if the visit was conducted virtually. If needed, we may recommend an in-person follow-up with your provider.  Ongoing Care Seeing your primary care provider every 3 to 6 months helps us  monitor your health and provide consistent, personalized care. Next office visit on 01/21/2025.  Keep up the good work.  Referrals If a referral was made during today's visit and you haven't received any updates within two weeks, please contact the referred provider directly to check on the status.  Recommended Screenings:  Health Maintenance  Topic Date Due   Medicare Annual Wellness Visit  08/30/2024   Eye exam for diabetics  10/24/2024   Hemoglobin A1C  01/07/2025   Yearly kidney health urinalysis for diabetes  01/08/2025   Yearly kidney function blood test for diabetes  07/09/2025   Complete foot exam   07/19/2025   Colon Cancer Screening  04/04/2026   Pneumococcal Vaccine for age over 71  Completed   Flu Shot  Completed   Osteoporosis screening with Bone Density Scan  Completed   Hepatitis C Screening  Completed   Zoster (Shingles) Vaccine  Completed   Meningitis B Vaccine  Aged Out   DTaP/Tdap/Td vaccine  Discontinued   Breast Cancer Screening  Discontinued   COVID-19 Vaccine  Discontinued       08/27/2024    9:02 AM  Advanced Directives  Does Patient Have a Medical Advance Directive? Yes  Type of Estate Agent of [place];Living will;Out of facility DNR (pink MOST or yellow form)  Copy of Healthcare Power of Attorney in Chart? No - copy requested    Vision: Annual vision screenings are recommended for early detection of  glaucoma, cataracts, and diabetic retinopathy. These exams can also reveal signs of chronic conditions such as diabetes and high blood pressure.  Dental: Annual dental screenings help detect early signs of oral cancer, gum disease, and other conditions linked to overall health, including heart disease and diabetes.  Please see the attached documents for additional preventive care recommendations.

## 2024-08-31 NOTE — Progress Notes (Signed)
 "  Chief Complaint  Patient presents with   Medicare Wellness     Subjective:   Mary Ward is a 78 y.o. female who presents for a Medicare Annual Wellness Visit.  Visit info / Clinical Intake: Medicare Wellness Visit Type:: Subsequent Annual Wellness Visit Persons participating in visit and providing information:: patient Medicare Wellness Visit Mode:: In-person (required for WTM) Interpreter Needed?: No Pre-visit prep was completed: yes AWV questionnaire completed by patient prior to visit?: yes Date:: 08/27/24 Living arrangements:: (Patient-Rptd) lives with spouse/significant other Patient's Overall Health Status Rating: (Patient-Rptd) very good Typical amount of pain: (Patient-Rptd) some Does pain affect daily life?: (Patient-Rptd) no Are you currently prescribed opioids?: no  Dietary Habits and Nutritional Risks How many meals a day?: (Patient-Rptd) 3 Eats fruit and vegetables daily?: (Patient-Rptd) yes Most meals are obtained by: (Patient-Rptd) preparing own meals In the last 2 weeks, have you had any of the following?: none Diabetic:: (!) yes Any non-healing wounds?: no How often do you check your BS?: 0 Would you like to be referred to a Nutritionist or for Diabetic Management? : no  Functional Status Activities of Daily Living (to include ambulation/medication): (Patient-Rptd) Independent Ambulation: Independent with device- listed below Home Assistive Devices/Equipment: Contact lenses Medication Administration: (Patient-Rptd) Independent Home Management (perform basic housework or laundry): (Patient-Rptd) Independent Manage your own finances?: (Patient-Rptd) yes Primary transportation is: (Patient-Rptd) driving Concerns about vision?: no *vision screening is required for WTM* Concerns about hearing?: no  Fall Screening Falls in the past year?: (Patient-Rptd) 0 Number of falls in past year: 0 Was there an injury with Fall?: 0 Fall Risk Category Calculator:  0 Patient Fall Risk Level: Low Fall Risk  Fall Risk Patient at Risk for Falls Due to: No Fall Risks Fall risk Follow up: Falls evaluation completed; Falls prevention discussed  Home and Transportation Safety: All rugs have non-skid backing?: (Patient-Rptd) yes All stairs or steps have railings?: (Patient-Rptd) yes Grab bars in the bathtub or shower?: (Patient-Rptd) yes Have non-skid surface in bathtub or shower?: (Patient-Rptd) yes Good home lighting?: (Patient-Rptd) yes Regular seat belt use?: (Patient-Rptd) yes Hospital stays in the last year:: (Patient-Rptd) no  Cognitive Assessment Difficulty concentrating, remembering, or making decisions? : (Patient-Rptd) no Will 6CIT or Mini Cog be Completed: no 6CIT or Mini Cog Declined: patient alert, oriented, able to answer questions appropriately and recall recent events  Advance Directives (For Healthcare) Does Patient Have a Medical Advance Directive?: Yes Type of Advance Directive: Healthcare Power of [place]; Living will; Out of facility DNR (pink MOST or yellow form) Copy of Healthcare Power of Attorney in Chart?: No - copy requested Copy of Living Will in Chart?: No - copy requested Out of facility DNR (pink MOST or yellow form) in Chart? (Ambulatory ONLY): No - copy requested  Reviewed/Updated  Reviewed/Updated: Reviewed All (Medical, Surgical, Family, Medications, Allergies, Care Teams, Patient Goals)    Allergies (verified) Amlodipine , Indomethacin, Prevnar 13 [pneumococcal 13-val conj vacc], Tetanus toxoid, Tetracycline, Trazodone  and nefazodone, and Zolpidem    Current Medications (verified) Outpatient Encounter Medications as of 08/31/2024  Medication Sig   acetaminophen (TYLENOL) 500 MG tablet    aspirin 81 MG tablet Take 81 mg by mouth daily.   atorvastatin  (LIPITOR) 40 MG tablet TAKE 1 TABLET BY MOUTH DAILY   cholecalciferol (VITAMIN D3) 25 MCG (1000 UNIT) tablet    Cyanocobalamin  (VITAMIN B-12) 1000 MCG/15ML LIQD     meloxicam  (MOBIC ) 15 MG tablet TAKE 1 TABLET BY MOUTH DAILY AS NEEDED FOR PAIN   polyethylene  glycol (MIRALAX / GLYCOLAX) 17 g packet Take 17 g by mouth daily.   SYNTHROID  125 MCG tablet TAKE 1 TABLET (125 MCG TOTAL) BY MOUTH DAILY.   telmisartan -hydrochlorothiazide (MICARDIS  HCT) 40-12.5 MG tablet TAKE 1 TABLET BY MOUTH DAILY   traMADol  (ULTRAM ) 50 MG tablet TAKE ONE TABLET BY MOUTH EVERY NIGHT AT BEDTIME AS NEEDED FOR PAIN   No facility-administered encounter medications on file as of 08/31/2024.    History: Past Medical History:  Diagnosis Date   Allergy    dust, dogs, grass   EN (erythema nodosum)    Hyperlipidemia    Hyperplastic colonic polyp    Hypertension    Hypothyroidism    Obesity    Past Surgical History:  Procedure Laterality Date   ADENOIDECTOMY     BREAST REDUCTION SURGERY  2002   BUNIONECTOMY     both feet   COLONOSCOPY  01/22/2014   CYST EXCISION  12/15/2012   punch biopsy left shoulder blade   GUM SURGERY     HAMMER TOE SURGERY     right 2nd toe   LAPAROSCOPIC ASSISTED VAGINAL HYSTERECTOMY  1992   with BSO   OOPHORECTOMY     TONSILLECTOMY     Family History  Problem Relation Age of Onset   Hyperlipidemia Mother    Stroke Mother    Hypertension Mother    Heart attack Mother    Cancer Father        Lung   Diabetes Sister    Heart disease Sister        CAD   Hypertension Sister    Colon cancer Neg Hx    Esophageal cancer Neg Hx    Rectal cancer Neg Hx    Stomach cancer Neg Hx    Colon polyps Neg Hx    Social History   Occupational History   Occupation: Optometrist: [employer]   Occupation: RETIRED  Tobacco Use   Smoking status: Never   Smokeless tobacco: Never  Vaping Use   Vaping status: Never Used  Substance and Sexual Activity   Alcohol use: Not Currently    Alcohol/week: 0.0 standard drinks of alcohol   Drug use: No   Sexual activity: Not Currently    Birth control/protection: Post-menopausal,  Surgical    Comment: Hysterectomy   Tobacco Counseling Counseling given: Not Answered  SDOH Screenings   Food Insecurity: No Food Insecurity (08/31/2024)  Housing: Low Risk (08/31/2024)  Transportation Needs: No Transportation Needs (08/31/2024)  Utilities: Not At Risk (08/31/2024)  Alcohol Screen: Low Risk (07/15/2024)  Depression (PHQ2-9): Low Risk (08/31/2024)  Financial Resource Strain: Low Risk (07/15/2024)  Physical Activity: Sufficiently Active (08/31/2024)  Social Connections: Socially Integrated (08/31/2024)  Stress: No Stress Concern Present (08/31/2024)  Recent Concern: Stress - Stress Concern Present (07/15/2024)  Tobacco Use: Low Risk (08/31/2024)  Health Literacy: Adequate Health Literacy (08/31/2024)   See flowsheets for full screening details  Depression Screen PHQ 2 & 9 Depression Scale- Over the past 2 weeks, how often have you been bothered by any of the following problems? Little interest or pleasure in doing things: 0 Feeling down, depressed, or hopeless (PHQ Adolescent also includes...irritable): 0 PHQ-2 Total Score: 0 Trouble falling or staying asleep, or sleeping too much: 2 (all) Feeling tired or having little energy: 0 Poor appetite or overeating (PHQ Adolescent also includes...weight loss): 0 Feeling bad about yourself - or that you are a failure or have let yourself or your family down:  0 Trouble concentrating on things, such as reading the newspaper or watching television [facility] Adolescent also includes...like school work): 0 Moving or speaking so slowly that other people could have noticed. Or the opposite - being so fidgety or restless that you have been moving around a lot more than usual: 0 Thoughts that you would be better off dead, or of hurting yourself in some way: 0 PHQ-9 Total Score: 2 If you checked off any problems, how difficult have these problems made it for you to do your work, take care of things at home, or get along with other people?:  Not difficult at all  Depression Treatment Depression Interventions/Treatment : [id number] Score <4 Follow-up Not Indicated     Goals Addressed               This Visit's Progress     Patient Stated (pt-stated)        Would like to lose some weight/2025 at least 10 lbs             Objective:    Today's Vitals   08/31/24 1515  BP: (!) 140/80  Pulse: 60  SpO2: 97%  Weight: 185 lb 12.8 oz (84.3 kg)  Height: 5' 4.5 (1.638 m)   Body mass index is 31.4 kg/m.  Hearing/Vision screen Hearing Screening - Comments:: Denies hearing difficulties   Vision Screening - Comments:: Wears contact lenses/UTD/Miller vision Immunizations and Health Maintenance Health Maintenance  Topic Date Due   OPHTHALMOLOGY EXAM  10/24/2024   HEMOGLOBIN A1C  01/07/2025   Diabetic kidney evaluation - Urine ACR  01/08/2025   Diabetic kidney evaluation - eGFR measurement  07/09/2025   FOOT EXAM  07/19/2025   Medicare Annual Wellness (AWV)  08/31/2025   Colonoscopy  04/04/2026   Pneumococcal Vaccine: 50+ Years  Completed   Influenza Vaccine  Completed   Bone Density Scan  Completed   Hepatitis C Screening  Completed   Zoster Vaccines- Shingrix  Completed   Meningococcal B Vaccine  Aged Out   DTaP/Tdap/Td  Discontinued   Mammogram  Discontinued   COVID-19 Vaccine  Discontinued        Assessment/Plan:  This is a routine wellness examination for Mary Ward.  Patient Care Team: Norleen Lynwood ORN, MD as PCP - General (Internal Medicine) Kate Lonni CROME, MD as PCP - Cardiology (Cardiology) Wonda Cy BROCKS, RD as Dietitian (Family Medicine) Cleotilde Sewer, OD as Consulting Physician (Optometry) Leva Norleen, MD as Consulting Physician (Obstetrics and Gynecology)  I have personally reviewed and noted the following in the patients chart:   Medical and social history Use of alcohol, tobacco or illicit drugs  Current medications and supplements including opioid prescriptions. Functional ability  and status Nutritional status Physical activity Advanced directives List of other physicians Hospitalizations, surgeries, and ER visits in previous 12 months Vitals Screenings to include cognitive, depression, and falls Referrals and appointments  No orders of the defined types were placed in this encounter.  In addition, I have reviewed and discussed with patient certain preventive protocols, quality metrics, and best practice recommendations. A written personalized care plan for preventive services as well as general preventive health recommendations were provided to patient.   Davyon Fisch L Carson Bogden, CMA   08/31/2024   Return in 1 year (on 08/31/2025).  After Visit Summary: (MyChart) Due to this being a telephonic visit, the after visit summary with patients personalized plan was offered to patient via MyChart   Nurse Notes: Patient stated that she is scheduled for her eye  exam on February 11,2026.  She had no other concerns to address today. "

## 2024-09-01 LAB — THYROID PANEL WITH TSH
Free Thyroxine Index: 2.7 (ref 1.4–3.8)
T3 Uptake: 33 % (ref 22–35)
T4, Total: 8.1 ug/dL (ref 5.1–11.9)
TSH: 0.64 m[IU]/L (ref 0.40–4.50)

## 2024-09-03 ENCOUNTER — Ambulatory Visit: Payer: Self-pay | Admitting: Internal Medicine

## 2024-09-12 ENCOUNTER — Ambulatory Visit: Admitting: Internal Medicine

## 2024-09-18 ENCOUNTER — Ambulatory Visit: Payer: Self-pay

## 2024-09-18 ENCOUNTER — Encounter: Payer: Self-pay | Admitting: Cardiology

## 2024-09-18 DIAGNOSIS — B351 Tinea unguium: Secondary | ICD-10-CM

## 2024-09-18 NOTE — Progress Notes (Signed)
 Patient presents today for the 4th laser treatment. Diagnosed with mycotic nail infection by Dr. Loel.   Toenail most affected rt 1st hallux. The RT 1st is almost completely clear with a very tiny area of discoloration in the distal medial corner.   All other systems are negative.  Nails were filed thin. Laser therapy was administered to 1st toenails BL and patient tolerated the treatment well. All safety precautions were in place.   Post treatment instructions reviewed and provided to patient. Patient had no questions regarding plan of care.   Follow up in 6 weeks for laser # 5.

## 2024-09-18 NOTE — Progress Notes (Unsigned)
 " Cardiology Office Note:    Date:  09/19/2024   ID:  Mary Ward, DOB April 29, 1946, MRN [id number]  PCP:  Norleen Lynwood ORN, MD  Cardiologist:  Lonni LITTIE Nanas, MD  Electrophysiologist:  None   Referring MD: Norleen Lynwood ORN, MD   Chief Complaint  Patient presents with   Coronary Artery Disease    History of Present Illness:    Mary Ward is a 79 y.o. female with a hx of CAD, hypertension, hypothyroidism, hyperlipidemia, erythema nodosum who presents for follow-up.  She was referred by Dr. Norleen for evaluation of coronary calcifications, initially seen on 05/05/2021.   Calcium  score on 02/17/2021 was 258 (80th percentile).  Also with borderline aortic dilatation measuring 38 mm.  Underwent Lexiscan Myoview  in 11/2017 which showed normal perfusion, EF 68%.  Echocardiogram 05/08/2021 showed EF 55 to 60%, basal inferior hypokinesis, grade 1 diastolic dysfunction, mild RV dysfunction, mild MR, mild dilatation of ascending aorta measuring 39 mm.  Since last clinic visit, she reports she is doing well. Denies any chest pain, dyspnea, lightheadedness, syncope, lower extremity edema, or palpitations.  She goes to gym 3 days/week, will ride bike for 30 minutes and does 30 minutes of chair aerobics and sometimes 1 hour walk on treadmill.  Denies any exertional symptoms.   Past Medical History:  Diagnosis Date   Allergy    dust, dogs, grass   EN (erythema nodosum)    Hyperlipidemia    Hyperplastic colonic polyp    Hypertension    Hypothyroidism    Obesity     Past Surgical History:  Procedure Laterality Date   ADENOIDECTOMY     BREAST REDUCTION SURGERY  2002   BUNIONECTOMY     both feet   COLONOSCOPY  01/22/2014   CYST EXCISION  12/15/2012   punch biopsy left shoulder blade   GUM SURGERY     HAMMER TOE SURGERY     right 2nd toe   LAPAROSCOPIC ASSISTED VAGINAL HYSTERECTOMY  1992   with BSO   OOPHORECTOMY     TONSILLECTOMY      Current Medications: Current Meds  Medication Sig    acetaminophen (TYLENOL) 500 MG tablet    aspirin 81 MG tablet Take 81 mg by mouth daily.   atorvastatin  (LIPITOR) 40 MG tablet TAKE 1 TABLET BY MOUTH DAILY   cholecalciferol (VITAMIN D3) 25 MCG (1000 UNIT) tablet    Cyanocobalamin  (VITAMIN B-12) 1000 MCG/15ML LIQD    Magnesium Glycinate 120 MG CAPS Take 360 mg by mouth daily.   meloxicam  (MOBIC ) 15 MG tablet TAKE 1 TABLET BY MOUTH DAILY AS NEEDED FOR PAIN   polyethylene glycol (MIRALAX / GLYCOLAX) 17 g packet Take 17 g by mouth daily.   SYNTHROID  125 MCG tablet TAKE 1 TABLET (125 MCG TOTAL) BY MOUTH DAILY.   telmisartan -hydrochlorothiazide (MICARDIS  HCT) 40-12.5 MG tablet TAKE 1 TABLET BY MOUTH DAILY   traMADol  (ULTRAM ) 50 MG tablet TAKE ONE TABLET BY MOUTH EVERY NIGHT AT BEDTIME AS NEEDED FOR PAIN     Allergies:   Amlodipine , Indomethacin, Prevnar 13 [pneumococcal 13-val conj vacc], Tetanus toxoid, Tetracycline, Trazodone  and nefazodone, and Zolpidem    Social History   Socioeconomic History   Marital status: Married    Spouse name: Toribio BRAVO Lippold   Number of children: 0   Years of education: Not on file   Highest education level: Some college, no degree  Occupational History   Occupation: Optometrist: [employer]  Occupation: RETIRED  Tobacco Use   Smoking status: Never   Smokeless tobacco: Never  Vaping Use   Vaping status: Never Used  Substance and Sexual Activity   Alcohol use: Not Currently    Alcohol/week: 0.0 standard drinks of alcohol   Drug use: No   Sexual activity: Not Currently    Birth control/protection: Post-menopausal, Surgical    Comment: Hysterectomy  Other Topics Concern   Not on file  Social History Narrative   Family history of high Cholesterol   Lives with husband/2025   Social Drivers of Health   Tobacco Use: Low Risk (08/31/2024)   Patient History    Smoking Tobacco Use: Never    Smokeless Tobacco Use: Never    Passive Exposure: Not on file  Financial Resource  Strain: Low Risk (07/15/2024)   Overall Financial Resource Strain (CARDIA)    Difficulty of Paying Living Expenses: Not hard at all  Food Insecurity: No Food Insecurity (08/31/2024)   Epic    Worried About Radiation Protection Practitioner of Food in the Last Year: Never true    Ran Out of Food in the Last Year: Never true  Transportation Needs: No Transportation Needs (08/31/2024)   Epic    Lack of Transportation (Medical): No    Lack of Transportation (Non-Medical): No  Physical Activity: Sufficiently Active (08/31/2024)   Exercise Vital Sign    Days of Exercise per Week: 3 days    Minutes of Exercise per Session: 60 min  Stress: No Stress Concern Present (08/31/2024)   [employer] of Occupational [employer] - Occupational Stress Questionnaire    Feeling of Stress: Only a little  Recent Concern: Stress - Stress Concern Present (07/15/2024)   [employer] of Occupational Health - Occupational Stress Questionnaire    Feeling of Stress: To some extent  Social Connections: Socially Integrated (08/31/2024)   Social Connection and Isolation Panel    Frequency of Communication with Friends and Family: More than three times a week    Frequency of Social Gatherings with Friends and Family: Not on file    Attends Religious Services: More than 4 times per year    Active Member of Clubs or Organizations: Yes    Attends Banker Meetings: More than 4 times per year    Marital Status: Married  Depression (PHQ2-9): Low Risk (08/31/2024)   Depression (PHQ2-9)    PHQ-2 Score: 2  Alcohol Screen: Low Risk (07/15/2024)   Alcohol Screen    Last Alcohol Screening Score (AUDIT): 1  Housing: Low Risk (08/31/2024)   Epic    Unable to Pay for Housing in the Last Year: No    Number of Times Moved in the Last Year: 0    Homeless in the Last Year: No  Utilities: Not At Risk (08/31/2024)   Epic    Threatened with loss of utilities: No  Health Literacy: Adequate Health Literacy (08/31/2024)   B1300 Health  Literacy    Frequency of need for help with medical instructions: Never     Family History: The patient's family history includes Cancer in her father; Diabetes in her sister; Heart attack in her mother; Heart disease in her sister; Hyperlipidemia in her mother; Hypertension in her mother and sister; Stroke in her mother. There is no history of Colon cancer, Esophageal cancer, Rectal cancer, Stomach cancer, or Colon polyps.  ROS:   Please see the history of present illness.     All other systems reviewed and are negative.  EKGs/Labs/Other Studies Reviewed:  The following studies were reviewed today:   EKG:   05/06/2022: Sinus bradycardia, rate 59, no ST abnormalities, poor R wave progression 05/23/23: Normal sinus rhythm, rate 64, Q waves in inferior leads, poor R wave progression 09/22/2023: Normal sinus rhythm, Q waves in leads III/aVF, poor R wave progression 09/19/2024: Normal sinus rhythm, low voltage, Q-wave in lead III, poor R wave progression, rate 65  Recent Labs: 07/09/2024: ALT 17; BUN 23; Creatinine, Ser 0.75; Potassium 4.0; Sodium 139 07/13/2024: Hemoglobin 13.8; Platelets 135.0 08/31/2024: TSH 0.64  Recent Lipid Panel    Component Value Date/Time   CHOL 125 07/09/2024 0816   CHOL 137 05/23/2023 1537   TRIG 84.0 07/09/2024 0816   TRIG 85 07/28/2006 0740   HDL 42.00 07/09/2024 0816   HDL 53 05/23/2023 1537   CHOLHDL 3 07/09/2024 0816   VLDL 16.8 07/09/2024 0816   LDLCALC 67 07/09/2024 0816   LDLCALC 65 05/23/2023 1537    Physical Exam:    VS:  BP 136/74 (BP Location: Left Arm, Patient Position: Sitting, Cuff Size: Normal)   Pulse 65   Ht 5' 6 (1.676 m)   Wt 185 lb 9.6 oz (84.2 kg)   LMP 09/13/1994   SpO2 97%   BMI 29.96 kg/m     Wt Readings from Last 3 Encounters:  09/19/24 185 lb 9.6 oz (84.2 kg)  08/31/24 185 lb 12.8 oz (84.3 kg)  07/19/24 179 lb (81.2 kg)     GEN:  Well nourished, well developed in no acute distress HEENT: Normal NECK: No JVD; No  carotid bruits CARDIAC: RRR,2/6 systolic murmur RESPIRATORY:  Clear to auscultation without rales, wheezing or rhonchi  ABDOMEN: Soft, non-tender, non-distended MUSCULOSKELETAL:  No edema; No deformity  SKIN: Warm and dry NEUROLOGIC:  Alert and oriented x 3 PSYCHIATRIC:  Normal affect   ASSESSMENT:    1. Coronary artery disease involving native coronary artery of native heart without angina pectoris   2. Essential hypertension   3. Aortic dilatation   4. Pain of lower extremity, unspecified laterality   5. Abnormal EKG   6. Hyperlipidemia, unspecified hyperlipidemia type      PLAN:    CAD: Calcium  score on 02/17/2021 was 258 (80th percentile).  Denies any anginal symptoms. -Continue atorvastatin  40 mg daily -Continue aspirin 81 mg daily  Abnormal EKG: Poor R wave progression.  Echocardiogram 05/08/2021 showed EF 55 to 60%, basal inferior hypokinesis, grade 1 diastolic dysfunction, mild RV dysfunction, mild MR, mild dilatation of ascending aorta measuring 39 mm.  Aortic dilatation: Borderline ascending aortic dilatation noted on noncontrast chest CT for calcium  score.  CTA chest 04/27/2022 showed stable ascending aorta measured 38 mm.  Leg pain: Normal ABIs 05/2023.  Did trial off statin 05/2023 with no improvement in symptoms.  Diagnosed with bursitis, symptoms resolved with prednisone   Hypertension: On telmisartan -hydrochlorothiazide 40-12.5 mg daily. Appears controlled.   Hyperlipidemia: On atorvastatin  20 mg daily, LDL 83 on 02/01/23.  Atorvastatin  increased to 40 mg daily at that time.  LDL 67 on 07/09/24  Pulmonary nodule: Noted on CTA chest 04/27/2022.  Stable on CT chest 04/2023, no further follow-up needed  RTC in 1 year   Medication Adjustments/Labs and Tests Ordered: Current medicines are reviewed at length with the patient today.  Concerns regarding medicines are outlined above.  Orders Placed This Encounter  Procedures   Basic Metabolic Panel (BMET)   Magnesium   EKG  12-Lead   No orders of the defined types were placed in this encounter.  Patient Instructions  Medication Instructions:  Your physician recommends that you continue on your current medications as directed. Please refer to the Current Medication list given to you today.  *If you need a refill on your cardiac medications before your next appointment, please call your pharmacy*  Lab Work: Bmet. Mg today If you have labs (blood work) drawn today and your tests are completely normal, you will receive your results only by: MyChart Message (if you have MyChart) OR A paper copy in the mail If you have any lab test that is abnormal or we need to change your treatment, we will call you to review the results.  Testing/Procedures: none  Follow-Up: At [facility], you and your health needs are our priority.  As part of our continuing mission to provide you with exceptional heart care, our providers are all part of one team.  This team includes your primary Cardiologist (physician) and Advanced Practice Providers or APPs (Physician Assistants and Nurse Practitioners) who all work together to provide you with the care you need, when you need it.  Your next appointment:   1 year  Provider:   Dr. Kate  We recommend signing up for the patient portal called MyChart.  Sign up information is provided on this After Visit Summary.  MyChart is used to connect with patients for Virtual Visits (Telemedicine).  Patients are able to view lab/test results, encounter notes, upcoming appointments, etc.  Non-urgent messages can be sent to your provider as well.   To learn more about what you can do with MyChart, go to [email or web address].   Other Instructions none           Signed, Lonni LITTIE Kate, MD  09/19/2024 8:31 AM    [facility] "

## 2024-09-19 ENCOUNTER — Ambulatory Visit: Admitting: Cardiology

## 2024-09-19 VITALS — BP 136/74 | HR 65 | Ht 66.0 in | Wt 185.6 lb

## 2024-09-19 DIAGNOSIS — R9431 Abnormal electrocardiogram [ECG] [EKG]: Secondary | ICD-10-CM

## 2024-09-19 DIAGNOSIS — M79606 Pain in leg, unspecified: Secondary | ICD-10-CM

## 2024-09-19 DIAGNOSIS — I2510 Atherosclerotic heart disease of native coronary artery without angina pectoris: Secondary | ICD-10-CM

## 2024-09-19 DIAGNOSIS — I10 Essential (primary) hypertension: Secondary | ICD-10-CM

## 2024-09-19 DIAGNOSIS — I77819 Aortic ectasia, unspecified site: Secondary | ICD-10-CM

## 2024-09-19 DIAGNOSIS — E785 Hyperlipidemia, unspecified: Secondary | ICD-10-CM | POA: Diagnosis not present

## 2024-09-19 LAB — BASIC METABOLIC PANEL WITH GFR
BUN/Creatinine Ratio: 29 — ABNORMAL HIGH (ref 12–28)
BUN: 24 mg/dL (ref 8–27)
CO2: 24 mmol/L (ref 20–29)
Calcium: 9.6 mg/dL (ref 8.7–10.3)
Chloride: 102 mmol/L (ref 96–106)
Creatinine, Ser: 0.84 mg/dL (ref 0.57–1.00)
Glucose: 102 mg/dL — ABNORMAL HIGH (ref 70–99)
Potassium: 4.7 mmol/L (ref 3.5–5.2)
Sodium: 142 mmol/L (ref 134–144)
eGFR: 72 mL/min/1.73

## 2024-09-19 LAB — MAGNESIUM: Magnesium: 2 mg/dL (ref 1.6–2.3)

## 2024-09-19 NOTE — Patient Instructions (Signed)
 Medication Instructions:  Your physician recommends that you continue on your current medications as directed. Please refer to the Current Medication list given to you today.  *If you need a refill on your cardiac medications before your next appointment, please call your pharmacy*  Lab Work: Bmet. Mg today If you have labs (blood work) drawn today and your tests are completely normal, you will receive your results only by: MyChart Message (if you have MyChart) OR A paper copy in the mail If you have any lab test that is abnormal or we need to change your treatment, we will call you to review the results.  Testing/Procedures: none  Follow-Up: At [facility], you and your health needs are our priority.  As part of our continuing mission to provide you with exceptional heart care, our providers are all part of one team.  This team includes your primary Cardiologist (physician) and Advanced Practice Providers or APPs (Physician Assistants and Nurse Practitioners) who all work together to provide you with the care you need, when you need it.  Your next appointment:   1 year  Provider:   Dr. Kate  We recommend signing up for the patient portal called MyChart.  Sign up information is provided on this After Visit Summary.  MyChart is used to connect with patients for Virtual Visits (Telemedicine).  Patients are able to view lab/test results, encounter notes, upcoming appointments, etc.  Non-urgent messages can be sent to your provider as well.   To learn more about what you can do with MyChart, go to [email or web address].   Other Instructions none

## 2024-09-20 ENCOUNTER — Ambulatory Visit: Payer: Self-pay | Admitting: Cardiology

## 2024-09-24 ENCOUNTER — Ambulatory Visit: Admitting: Internal Medicine

## 2024-09-27 ENCOUNTER — Ambulatory Visit: Admitting: Internal Medicine

## 2024-11-09 ENCOUNTER — Ambulatory Visit

## 2024-12-21 ENCOUNTER — Ambulatory Visit

## 2025-01-16 ENCOUNTER — Ambulatory Visit: Admitting: Internal Medicine

## 2025-01-17 ENCOUNTER — Ambulatory Visit: Admitting: Family Medicine

## 2025-01-21 ENCOUNTER — Ambulatory Visit: Admitting: Family Medicine
